# Patient Record
Sex: Female | Born: 1935
Health system: Southern US, Community
[De-identification: ages and names within clinical notes are randomized; demographics above are authoritative.]

## PROBLEM LIST (undated history)

## (undated) DIAGNOSIS — Z7901 Long term (current) use of anticoagulants: Secondary | ICD-10-CM

## (undated) DIAGNOSIS — I619 Nontraumatic intracerebral hemorrhage, unspecified: Secondary | ICD-10-CM

## (undated) DIAGNOSIS — R000 Tachycardia, unspecified: Secondary | ICD-10-CM

## (undated) DIAGNOSIS — I472 Ventricular tachycardia: Secondary | ICD-10-CM

## (undated) DIAGNOSIS — I82409 Acute embolism and thrombosis of unspecified deep veins of unspecified lower extremity: Secondary | ICD-10-CM

## (undated) DIAGNOSIS — I509 Heart failure, unspecified: Secondary | ICD-10-CM

## (undated) DIAGNOSIS — I2699 Other pulmonary embolism without acute cor pulmonale: Secondary | ICD-10-CM

## (undated) DIAGNOSIS — N179 Acute kidney failure, unspecified: Secondary | ICD-10-CM

## (undated) DIAGNOSIS — I519 Heart disease, unspecified: Secondary | ICD-10-CM

## (undated) DIAGNOSIS — I4891 Unspecified atrial fibrillation: Secondary | ICD-10-CM

## (undated) DIAGNOSIS — K922 Gastrointestinal hemorrhage, unspecified: Secondary | ICD-10-CM

## (undated) DIAGNOSIS — R042 Hemoptysis: Secondary | ICD-10-CM

## (undated) DIAGNOSIS — R7989 Other specified abnormal findings of blood chemistry: Secondary | ICD-10-CM

## (undated) DIAGNOSIS — R778 Other specified abnormalities of plasma proteins: Secondary | ICD-10-CM

## (undated) DIAGNOSIS — R739 Hyperglycemia, unspecified: Secondary | ICD-10-CM

## (undated) DIAGNOSIS — R0602 Shortness of breath: Secondary | ICD-10-CM

## (undated) DIAGNOSIS — D696 Thrombocytopenia, unspecified: Secondary | ICD-10-CM

## (undated) DIAGNOSIS — R740 Nonspecific elevation of levels of transaminase and lactic acid dehydrogenase [LDH]: Secondary | ICD-10-CM

## (undated) DIAGNOSIS — I2602 Saddle embolus of pulmonary artery with acute cor pulmonale: Secondary | ICD-10-CM

## (undated) HISTORY — DX: Hemoptysis: R04.2

## (undated) HISTORY — DX: Other pulmonary embolism without acute cor pulmonale: I26.99

## (undated) HISTORY — DX: Acute kidney failure, unspecified: N17.9

## (undated) HISTORY — DX: Tachycardia, unspecified: R00.0

## (undated) HISTORY — DX: Nonspecific elevation of levels of transaminase and lactic acid dehydrogenase (ldh): R74.0

## (undated) HISTORY — DX: Unspecified atrial fibrillation: I48.91

## (undated) HISTORY — DX: Heart disease, unspecified: I51.9

## (undated) HISTORY — DX: Shortness of breath: R06.02

## (undated) HISTORY — PX: KNEE SURGERY: SHX244

## (undated) HISTORY — DX: Hyperglycemia, unspecified: R73.9

## (undated) HISTORY — DX: Thrombocytopenia, unspecified: D69.6

## (undated) HISTORY — DX: Long term (current) use of anticoagulants: Z79.01

## (undated) HISTORY — PX: APPENDECTOMY: SHX54

## (undated) HISTORY — DX: Acute embolism and thrombosis of unspecified deep veins of unspecified lower extremity: I82.409

## (undated) HISTORY — DX: Other specified abnormal findings of blood chemistry: R79.89

## (undated) HISTORY — DX: Gastrointestinal hemorrhage, unspecified: K92.2

## (undated) HISTORY — PX: CHOLECYSTECTOMY: SHX55

## (undated) HISTORY — DX: Nontraumatic intracerebral hemorrhage, unspecified: I61.9

## (undated) HISTORY — PX: OTHER SURGICAL HISTORY: SHX169

## (undated) HISTORY — DX: Ventricular tachycardia: I47.2

## (undated) HISTORY — DX: Other specified abnormalities of plasma proteins: R77.8

## (undated) HISTORY — DX: Morbid (severe) obesity due to excess calories: E66.01

## (undated) HISTORY — DX: Saddle embolus of pulmonary artery with acute cor pulmonale: I26.02

---

## 2007-11-12 DIAGNOSIS — I619 Nontraumatic intracerebral hemorrhage, unspecified: Secondary | ICD-10-CM

## 2007-11-12 HISTORY — DX: Nontraumatic intracerebral hemorrhage, unspecified: I61.9

## 2015-01-05 DIAGNOSIS — H4011X1 Primary open-angle glaucoma, mild stage: Secondary | ICD-10-CM | POA: Diagnosis not present

## 2015-03-16 DIAGNOSIS — E1165 Type 2 diabetes mellitus with hyperglycemia: Secondary | ICD-10-CM | POA: Diagnosis not present

## 2015-03-16 DIAGNOSIS — E559 Vitamin D deficiency, unspecified: Secondary | ICD-10-CM | POA: Diagnosis not present

## 2015-03-16 DIAGNOSIS — E785 Hyperlipidemia, unspecified: Secondary | ICD-10-CM | POA: Diagnosis not present

## 2015-03-16 DIAGNOSIS — K922 Gastrointestinal hemorrhage, unspecified: Secondary | ICD-10-CM | POA: Diagnosis not present

## 2015-04-04 DIAGNOSIS — H4011X1 Primary open-angle glaucoma, mild stage: Secondary | ICD-10-CM | POA: Diagnosis not present

## 2015-06-02 DIAGNOSIS — L03116 Cellulitis of left lower limb: Secondary | ICD-10-CM | POA: Diagnosis not present

## 2015-06-02 DIAGNOSIS — Z6841 Body Mass Index (BMI) 40.0 and over, adult: Secondary | ICD-10-CM | POA: Diagnosis not present

## 2015-06-02 DIAGNOSIS — T63421A Toxic effect of venom of ants, accidental (unintentional), initial encounter: Secondary | ICD-10-CM | POA: Diagnosis not present

## 2015-06-21 DIAGNOSIS — E559 Vitamin D deficiency, unspecified: Secondary | ICD-10-CM | POA: Diagnosis not present

## 2015-06-21 DIAGNOSIS — Z6841 Body Mass Index (BMI) 40.0 and over, adult: Secondary | ICD-10-CM | POA: Diagnosis not present

## 2015-06-21 DIAGNOSIS — K922 Gastrointestinal hemorrhage, unspecified: Secondary | ICD-10-CM | POA: Diagnosis not present

## 2015-06-21 DIAGNOSIS — I10 Essential (primary) hypertension: Secondary | ICD-10-CM | POA: Diagnosis not present

## 2015-06-21 DIAGNOSIS — E785 Hyperlipidemia, unspecified: Secondary | ICD-10-CM | POA: Diagnosis not present

## 2015-06-21 DIAGNOSIS — Z139 Encounter for screening, unspecified: Secondary | ICD-10-CM | POA: Diagnosis not present

## 2015-06-21 DIAGNOSIS — E1165 Type 2 diabetes mellitus with hyperglycemia: Secondary | ICD-10-CM | POA: Diagnosis not present

## 2015-07-11 DIAGNOSIS — H4011X1 Primary open-angle glaucoma, mild stage: Secondary | ICD-10-CM | POA: Diagnosis not present

## 2015-07-20 DIAGNOSIS — H26492 Other secondary cataract, left eye: Secondary | ICD-10-CM | POA: Diagnosis not present

## 2015-09-27 DIAGNOSIS — E559 Vitamin D deficiency, unspecified: Secondary | ICD-10-CM | POA: Diagnosis not present

## 2015-09-27 DIAGNOSIS — K922 Gastrointestinal hemorrhage, unspecified: Secondary | ICD-10-CM | POA: Diagnosis not present

## 2015-09-27 DIAGNOSIS — I10 Essential (primary) hypertension: Secondary | ICD-10-CM | POA: Diagnosis not present

## 2015-09-27 DIAGNOSIS — Z1231 Encounter for screening mammogram for malignant neoplasm of breast: Secondary | ICD-10-CM | POA: Diagnosis not present

## 2015-09-27 DIAGNOSIS — E785 Hyperlipidemia, unspecified: Secondary | ICD-10-CM | POA: Diagnosis not present

## 2015-09-27 DIAGNOSIS — E1165 Type 2 diabetes mellitus with hyperglycemia: Secondary | ICD-10-CM | POA: Diagnosis not present

## 2015-09-27 DIAGNOSIS — Z1389 Encounter for screening for other disorder: Secondary | ICD-10-CM | POA: Diagnosis not present

## 2015-09-27 DIAGNOSIS — M170 Bilateral primary osteoarthritis of knee: Secondary | ICD-10-CM | POA: Diagnosis not present

## 2015-09-27 DIAGNOSIS — Z23 Encounter for immunization: Secondary | ICD-10-CM | POA: Diagnosis not present

## 2015-10-12 DIAGNOSIS — H401131 Primary open-angle glaucoma, bilateral, mild stage: Secondary | ICD-10-CM | POA: Diagnosis not present

## 2015-10-25 DIAGNOSIS — M170 Bilateral primary osteoarthritis of knee: Secondary | ICD-10-CM | POA: Diagnosis not present

## 2015-10-31 DIAGNOSIS — Z1231 Encounter for screening mammogram for malignant neoplasm of breast: Secondary | ICD-10-CM | POA: Diagnosis not present

## 2016-01-01 DIAGNOSIS — E785 Hyperlipidemia, unspecified: Secondary | ICD-10-CM | POA: Diagnosis not present

## 2016-01-01 DIAGNOSIS — K922 Gastrointestinal hemorrhage, unspecified: Secondary | ICD-10-CM | POA: Diagnosis not present

## 2016-01-01 DIAGNOSIS — I10 Essential (primary) hypertension: Secondary | ICD-10-CM | POA: Diagnosis not present

## 2016-01-01 DIAGNOSIS — E1165 Type 2 diabetes mellitus with hyperglycemia: Secondary | ICD-10-CM | POA: Diagnosis not present

## 2016-01-01 DIAGNOSIS — M1990 Unspecified osteoarthritis, unspecified site: Secondary | ICD-10-CM | POA: Diagnosis not present

## 2016-01-01 DIAGNOSIS — Z6841 Body Mass Index (BMI) 40.0 and over, adult: Secondary | ICD-10-CM | POA: Diagnosis not present

## 2016-01-01 DIAGNOSIS — E559 Vitamin D deficiency, unspecified: Secondary | ICD-10-CM | POA: Diagnosis not present

## 2016-04-17 DIAGNOSIS — H401131 Primary open-angle glaucoma, bilateral, mild stage: Secondary | ICD-10-CM | POA: Diagnosis not present

## 2016-07-03 DIAGNOSIS — E1165 Type 2 diabetes mellitus with hyperglycemia: Secondary | ICD-10-CM | POA: Diagnosis not present

## 2016-07-03 DIAGNOSIS — I10 Essential (primary) hypertension: Secondary | ICD-10-CM | POA: Diagnosis not present

## 2016-07-03 DIAGNOSIS — Z9181 History of falling: Secondary | ICD-10-CM | POA: Diagnosis not present

## 2016-07-03 DIAGNOSIS — Z6841 Body Mass Index (BMI) 40.0 and over, adult: Secondary | ICD-10-CM | POA: Diagnosis not present

## 2016-07-03 DIAGNOSIS — E559 Vitamin D deficiency, unspecified: Secondary | ICD-10-CM | POA: Diagnosis not present

## 2016-07-03 DIAGNOSIS — E785 Hyperlipidemia, unspecified: Secondary | ICD-10-CM | POA: Diagnosis not present

## 2016-07-03 DIAGNOSIS — K922 Gastrointestinal hemorrhage, unspecified: Secondary | ICD-10-CM | POA: Diagnosis not present

## 2016-07-17 DIAGNOSIS — H401131 Primary open-angle glaucoma, bilateral, mild stage: Secondary | ICD-10-CM | POA: Diagnosis not present

## 2016-10-21 DIAGNOSIS — R531 Weakness: Secondary | ICD-10-CM | POA: Diagnosis not present

## 2016-10-21 DIAGNOSIS — R609 Edema, unspecified: Secondary | ICD-10-CM | POA: Diagnosis not present

## 2016-10-21 DIAGNOSIS — M7989 Other specified soft tissue disorders: Secondary | ICD-10-CM | POA: Diagnosis not present

## 2016-10-21 DIAGNOSIS — R2240 Localized swelling, mass and lump, unspecified lower limb: Secondary | ICD-10-CM | POA: Diagnosis not present

## 2016-10-24 DIAGNOSIS — R600 Localized edema: Secondary | ICD-10-CM | POA: Diagnosis not present

## 2016-10-24 DIAGNOSIS — Z79899 Other long term (current) drug therapy: Secondary | ICD-10-CM | POA: Diagnosis not present

## 2016-10-24 DIAGNOSIS — Z23 Encounter for immunization: Secondary | ICD-10-CM | POA: Diagnosis not present

## 2016-10-24 DIAGNOSIS — Z6841 Body Mass Index (BMI) 40.0 and over, adult: Secondary | ICD-10-CM | POA: Diagnosis not present

## 2016-10-24 DIAGNOSIS — Z1231 Encounter for screening mammogram for malignant neoplasm of breast: Secondary | ICD-10-CM | POA: Diagnosis not present

## 2016-10-31 DIAGNOSIS — E876 Hypokalemia: Secondary | ICD-10-CM | POA: Diagnosis not present

## 2016-12-12 DIAGNOSIS — R52 Pain, unspecified: Secondary | ICD-10-CM | POA: Diagnosis not present

## 2016-12-12 DIAGNOSIS — R262 Difficulty in walking, not elsewhere classified: Secondary | ICD-10-CM | POA: Diagnosis not present

## 2016-12-12 DIAGNOSIS — E7800 Pure hypercholesterolemia, unspecified: Secondary | ICD-10-CM | POA: Diagnosis not present

## 2016-12-12 DIAGNOSIS — I509 Heart failure, unspecified: Secondary | ICD-10-CM | POA: Diagnosis not present

## 2016-12-12 DIAGNOSIS — I110 Hypertensive heart disease with heart failure: Secondary | ICD-10-CM | POA: Diagnosis not present

## 2016-12-12 DIAGNOSIS — M79651 Pain in right thigh: Secondary | ICD-10-CM | POA: Diagnosis not present

## 2016-12-12 DIAGNOSIS — M25561 Pain in right knee: Secondary | ICD-10-CM | POA: Diagnosis not present

## 2016-12-12 DIAGNOSIS — M79604 Pain in right leg: Secondary | ICD-10-CM | POA: Diagnosis not present

## 2016-12-12 DIAGNOSIS — Z8249 Family history of ischemic heart disease and other diseases of the circulatory system: Secondary | ICD-10-CM | POA: Diagnosis not present

## 2016-12-12 DIAGNOSIS — M7989 Other specified soft tissue disorders: Secondary | ICD-10-CM | POA: Diagnosis not present

## 2016-12-12 DIAGNOSIS — Z79899 Other long term (current) drug therapy: Secondary | ICD-10-CM | POA: Diagnosis not present

## 2016-12-12 DIAGNOSIS — K219 Gastro-esophageal reflux disease without esophagitis: Secondary | ICD-10-CM | POA: Diagnosis not present

## 2016-12-13 DIAGNOSIS — R54 Age-related physical debility: Secondary | ICD-10-CM | POA: Diagnosis not present

## 2016-12-13 DIAGNOSIS — E785 Hyperlipidemia, unspecified: Secondary | ICD-10-CM | POA: Diagnosis not present

## 2016-12-13 DIAGNOSIS — E662 Morbid (severe) obesity with alveolar hypoventilation: Secondary | ICD-10-CM | POA: Diagnosis not present

## 2016-12-13 DIAGNOSIS — M79651 Pain in right thigh: Secondary | ICD-10-CM | POA: Diagnosis not present

## 2016-12-13 DIAGNOSIS — Z79899 Other long term (current) drug therapy: Secondary | ICD-10-CM | POA: Diagnosis not present

## 2016-12-13 DIAGNOSIS — R279 Unspecified lack of coordination: Secondary | ICD-10-CM | POA: Diagnosis not present

## 2016-12-13 DIAGNOSIS — R262 Difficulty in walking, not elsewhere classified: Secondary | ICD-10-CM | POA: Diagnosis not present

## 2016-12-13 DIAGNOSIS — M1711 Unilateral primary osteoarthritis, right knee: Secondary | ICD-10-CM | POA: Diagnosis not present

## 2016-12-13 DIAGNOSIS — E7800 Pure hypercholesterolemia, unspecified: Secondary | ICD-10-CM | POA: Diagnosis not present

## 2016-12-13 DIAGNOSIS — I110 Hypertensive heart disease with heart failure: Secondary | ICD-10-CM | POA: Diagnosis not present

## 2016-12-13 DIAGNOSIS — J101 Influenza due to other identified influenza virus with other respiratory manifestations: Secondary | ICD-10-CM | POA: Diagnosis not present

## 2016-12-13 DIAGNOSIS — M7050 Other bursitis of knee, unspecified knee: Secondary | ICD-10-CM | POA: Diagnosis not present

## 2016-12-13 DIAGNOSIS — K219 Gastro-esophageal reflux disease without esophagitis: Secondary | ICD-10-CM | POA: Diagnosis not present

## 2016-12-13 DIAGNOSIS — I119 Hypertensive heart disease without heart failure: Secondary | ICD-10-CM | POA: Diagnosis not present

## 2016-12-13 DIAGNOSIS — M25561 Pain in right knee: Secondary | ICD-10-CM | POA: Diagnosis not present

## 2016-12-13 DIAGNOSIS — I509 Heart failure, unspecified: Secondary | ICD-10-CM | POA: Diagnosis not present

## 2016-12-13 DIAGNOSIS — Z8249 Family history of ischemic heart disease and other diseases of the circulatory system: Secondary | ICD-10-CM | POA: Diagnosis not present

## 2016-12-13 DIAGNOSIS — R609 Edema, unspecified: Secondary | ICD-10-CM | POA: Diagnosis not present

## 2016-12-13 DIAGNOSIS — Z7401 Bed confinement status: Secondary | ICD-10-CM | POA: Diagnosis not present

## 2016-12-14 DIAGNOSIS — J101 Influenza due to other identified influenza virus with other respiratory manifestations: Secondary | ICD-10-CM | POA: Diagnosis not present

## 2016-12-16 DIAGNOSIS — I119 Hypertensive heart disease without heart failure: Secondary | ICD-10-CM | POA: Diagnosis not present

## 2016-12-16 DIAGNOSIS — E785 Hyperlipidemia, unspecified: Secondary | ICD-10-CM | POA: Diagnosis not present

## 2016-12-16 DIAGNOSIS — R262 Difficulty in walking, not elsewhere classified: Secondary | ICD-10-CM | POA: Diagnosis not present

## 2016-12-16 DIAGNOSIS — M7050 Other bursitis of knee, unspecified knee: Secondary | ICD-10-CM | POA: Diagnosis not present

## 2016-12-19 DIAGNOSIS — M1711 Unilateral primary osteoarthritis, right knee: Secondary | ICD-10-CM | POA: Diagnosis not present

## 2016-12-28 DIAGNOSIS — I110 Hypertensive heart disease with heart failure: Secondary | ICD-10-CM | POA: Diagnosis not present

## 2016-12-28 DIAGNOSIS — I509 Heart failure, unspecified: Secondary | ICD-10-CM | POA: Diagnosis not present

## 2016-12-28 DIAGNOSIS — E7800 Pure hypercholesterolemia, unspecified: Secondary | ICD-10-CM | POA: Diagnosis not present

## 2016-12-28 DIAGNOSIS — M25461 Effusion, right knee: Secondary | ICD-10-CM | POA: Diagnosis not present

## 2016-12-28 DIAGNOSIS — M1711 Unilateral primary osteoarthritis, right knee: Secondary | ICD-10-CM | POA: Diagnosis not present

## 2016-12-31 DIAGNOSIS — M25461 Effusion, right knee: Secondary | ICD-10-CM | POA: Diagnosis not present

## 2016-12-31 DIAGNOSIS — M1711 Unilateral primary osteoarthritis, right knee: Secondary | ICD-10-CM | POA: Diagnosis not present

## 2016-12-31 DIAGNOSIS — E7800 Pure hypercholesterolemia, unspecified: Secondary | ICD-10-CM | POA: Diagnosis not present

## 2016-12-31 DIAGNOSIS — I110 Hypertensive heart disease with heart failure: Secondary | ICD-10-CM | POA: Diagnosis not present

## 2016-12-31 DIAGNOSIS — I509 Heart failure, unspecified: Secondary | ICD-10-CM | POA: Diagnosis not present

## 2017-01-01 DIAGNOSIS — M1711 Unilateral primary osteoarthritis, right knee: Secondary | ICD-10-CM | POA: Diagnosis not present

## 2017-01-01 DIAGNOSIS — I10 Essential (primary) hypertension: Secondary | ICD-10-CM | POA: Diagnosis not present

## 2017-01-01 DIAGNOSIS — Z1231 Encounter for screening mammogram for malignant neoplasm of breast: Secondary | ICD-10-CM | POA: Diagnosis not present

## 2017-01-01 DIAGNOSIS — Z1389 Encounter for screening for other disorder: Secondary | ICD-10-CM | POA: Diagnosis not present

## 2017-01-01 DIAGNOSIS — Z6841 Body Mass Index (BMI) 40.0 and over, adult: Secondary | ICD-10-CM | POA: Diagnosis not present

## 2017-01-01 DIAGNOSIS — E785 Hyperlipidemia, unspecified: Secondary | ICD-10-CM | POA: Diagnosis not present

## 2017-01-01 DIAGNOSIS — E1165 Type 2 diabetes mellitus with hyperglycemia: Secondary | ICD-10-CM | POA: Diagnosis not present

## 2017-01-02 DIAGNOSIS — M1711 Unilateral primary osteoarthritis, right knee: Secondary | ICD-10-CM | POA: Diagnosis not present

## 2017-01-02 DIAGNOSIS — M25461 Effusion, right knee: Secondary | ICD-10-CM | POA: Diagnosis not present

## 2017-01-02 DIAGNOSIS — I110 Hypertensive heart disease with heart failure: Secondary | ICD-10-CM | POA: Diagnosis not present

## 2017-01-02 DIAGNOSIS — E7800 Pure hypercholesterolemia, unspecified: Secondary | ICD-10-CM | POA: Diagnosis not present

## 2017-01-02 DIAGNOSIS — I509 Heart failure, unspecified: Secondary | ICD-10-CM | POA: Diagnosis not present

## 2017-01-07 DIAGNOSIS — M25461 Effusion, right knee: Secondary | ICD-10-CM | POA: Diagnosis not present

## 2017-01-07 DIAGNOSIS — M1711 Unilateral primary osteoarthritis, right knee: Secondary | ICD-10-CM | POA: Diagnosis not present

## 2017-01-07 DIAGNOSIS — E7800 Pure hypercholesterolemia, unspecified: Secondary | ICD-10-CM | POA: Diagnosis not present

## 2017-01-07 DIAGNOSIS — I110 Hypertensive heart disease with heart failure: Secondary | ICD-10-CM | POA: Diagnosis not present

## 2017-01-07 DIAGNOSIS — I509 Heart failure, unspecified: Secondary | ICD-10-CM | POA: Diagnosis not present

## 2017-01-09 DIAGNOSIS — I509 Heart failure, unspecified: Secondary | ICD-10-CM | POA: Diagnosis not present

## 2017-01-09 DIAGNOSIS — I110 Hypertensive heart disease with heart failure: Secondary | ICD-10-CM | POA: Diagnosis not present

## 2017-01-09 DIAGNOSIS — E876 Hypokalemia: Secondary | ICD-10-CM | POA: Diagnosis not present

## 2017-01-09 DIAGNOSIS — M1711 Unilateral primary osteoarthritis, right knee: Secondary | ICD-10-CM | POA: Diagnosis not present

## 2017-01-09 DIAGNOSIS — E7800 Pure hypercholesterolemia, unspecified: Secondary | ICD-10-CM | POA: Diagnosis not present

## 2017-01-09 DIAGNOSIS — Z5181 Encounter for therapeutic drug level monitoring: Secondary | ICD-10-CM | POA: Diagnosis not present

## 2017-01-09 DIAGNOSIS — M25461 Effusion, right knee: Secondary | ICD-10-CM | POA: Diagnosis not present

## 2017-01-14 DIAGNOSIS — I509 Heart failure, unspecified: Secondary | ICD-10-CM | POA: Diagnosis not present

## 2017-01-14 DIAGNOSIS — M1711 Unilateral primary osteoarthritis, right knee: Secondary | ICD-10-CM | POA: Diagnosis not present

## 2017-01-14 DIAGNOSIS — I110 Hypertensive heart disease with heart failure: Secondary | ICD-10-CM | POA: Diagnosis not present

## 2017-01-14 DIAGNOSIS — E7800 Pure hypercholesterolemia, unspecified: Secondary | ICD-10-CM | POA: Diagnosis not present

## 2017-01-14 DIAGNOSIS — M25461 Effusion, right knee: Secondary | ICD-10-CM | POA: Diagnosis not present

## 2017-01-15 ENCOUNTER — Encounter (HOSPITAL_COMMUNITY): Payer: Self-pay | Admitting: Internal Medicine

## 2017-01-15 ENCOUNTER — Inpatient Hospital Stay (HOSPITAL_COMMUNITY): Payer: Medicare PPO

## 2017-01-15 ENCOUNTER — Inpatient Hospital Stay (HOSPITAL_COMMUNITY)
Admission: AD | Admit: 2017-01-15 | Discharge: 2017-01-23 | DRG: 175 | Disposition: A | Discharge status: Skilled Nursing Facility | Payer: Medicare PPO | Source: Other Acute Inpatient Hospital | Attending: Internal Medicine | Admitting: Internal Medicine

## 2017-01-15 DIAGNOSIS — I214 Non-ST elevation (NSTEMI) myocardial infarction: Secondary | ICD-10-CM | POA: Diagnosis not present

## 2017-01-15 DIAGNOSIS — I2690 Septic pulmonary embolism without acute cor pulmonale: Secondary | ICD-10-CM | POA: Diagnosis not present

## 2017-01-15 DIAGNOSIS — J9690 Respiratory failure, unspecified, unspecified whether with hypoxia or hypercapnia: Secondary | ICD-10-CM | POA: Diagnosis not present

## 2017-01-15 DIAGNOSIS — R740 Nonspecific elevation of levels of transaminase and lactic acid dehydrogenase [LDH]: Secondary | ICD-10-CM

## 2017-01-15 DIAGNOSIS — R042 Hemoptysis: Secondary | ICD-10-CM | POA: Diagnosis not present

## 2017-01-15 DIAGNOSIS — N179 Acute kidney failure, unspecified: Secondary | ICD-10-CM | POA: Diagnosis not present

## 2017-01-15 DIAGNOSIS — R7401 Elevation of levels of liver transaminase levels: Secondary | ICD-10-CM

## 2017-01-15 DIAGNOSIS — I2692 Saddle embolus of pulmonary artery without acute cor pulmonale: Secondary | ICD-10-CM | POA: Diagnosis not present

## 2017-01-15 DIAGNOSIS — I509 Heart failure, unspecified: Secondary | ICD-10-CM | POA: Diagnosis not present

## 2017-01-15 DIAGNOSIS — D696 Thrombocytopenia, unspecified: Secondary | ICD-10-CM | POA: Diagnosis not present

## 2017-01-15 DIAGNOSIS — R7989 Other specified abnormal findings of blood chemistry: Secondary | ICD-10-CM

## 2017-01-15 DIAGNOSIS — Z6841 Body Mass Index (BMI) 40.0 and over, adult: Secondary | ICD-10-CM | POA: Diagnosis not present

## 2017-01-15 DIAGNOSIS — R000 Tachycardia, unspecified: Secondary | ICD-10-CM

## 2017-01-15 DIAGNOSIS — E785 Hyperlipidemia, unspecified: Secondary | ICD-10-CM | POA: Diagnosis present

## 2017-01-15 DIAGNOSIS — I82449 Acute embolism and thrombosis of unspecified tibial vein: Secondary | ICD-10-CM | POA: Diagnosis present

## 2017-01-15 DIAGNOSIS — I5082 Biventricular heart failure: Secondary | ICD-10-CM | POA: Diagnosis present

## 2017-01-15 DIAGNOSIS — I472 Ventricular tachycardia: Secondary | ICD-10-CM | POA: Diagnosis present

## 2017-01-15 DIAGNOSIS — Z9181 History of falling: Secondary | ICD-10-CM | POA: Diagnosis not present

## 2017-01-15 DIAGNOSIS — R0602 Shortness of breath: Secondary | ICD-10-CM

## 2017-01-15 DIAGNOSIS — I5032 Chronic diastolic (congestive) heart failure: Secondary | ICD-10-CM | POA: Diagnosis not present

## 2017-01-15 DIAGNOSIS — Z09 Encounter for follow-up examination after completed treatment for conditions other than malignant neoplasm: Secondary | ICD-10-CM | POA: Diagnosis not present

## 2017-01-15 DIAGNOSIS — I2609 Other pulmonary embolism with acute cor pulmonale: Secondary | ICD-10-CM | POA: Diagnosis not present

## 2017-01-15 DIAGNOSIS — I4891 Unspecified atrial fibrillation: Secondary | ICD-10-CM | POA: Diagnosis not present

## 2017-01-15 DIAGNOSIS — I110 Hypertensive heart disease with heart failure: Secondary | ICD-10-CM | POA: Diagnosis not present

## 2017-01-15 DIAGNOSIS — R42 Dizziness and giddiness: Secondary | ICD-10-CM | POA: Diagnosis not present

## 2017-01-15 DIAGNOSIS — I4901 Ventricular fibrillation: Secondary | ICD-10-CM | POA: Diagnosis not present

## 2017-01-15 DIAGNOSIS — E872 Acidosis: Secondary | ICD-10-CM | POA: Diagnosis not present

## 2017-01-15 DIAGNOSIS — Z8249 Family history of ischemic heart disease and other diseases of the circulatory system: Secondary | ICD-10-CM | POA: Diagnosis not present

## 2017-01-15 DIAGNOSIS — I5030 Unspecified diastolic (congestive) heart failure: Secondary | ICD-10-CM | POA: Diagnosis not present

## 2017-01-15 DIAGNOSIS — I2699 Other pulmonary embolism without acute cor pulmonale: Secondary | ICD-10-CM | POA: Diagnosis not present

## 2017-01-15 DIAGNOSIS — I480 Paroxysmal atrial fibrillation: Secondary | ICD-10-CM

## 2017-01-15 DIAGNOSIS — I2601 Septic pulmonary embolism with acute cor pulmonale: Secondary | ICD-10-CM | POA: Diagnosis not present

## 2017-01-15 DIAGNOSIS — Z452 Encounter for adjustment and management of vascular access device: Secondary | ICD-10-CM

## 2017-01-15 DIAGNOSIS — A419 Sepsis, unspecified organism: Secondary | ICD-10-CM | POA: Diagnosis not present

## 2017-01-15 DIAGNOSIS — R748 Abnormal levels of other serum enzymes: Secondary | ICD-10-CM | POA: Diagnosis not present

## 2017-01-15 DIAGNOSIS — R739 Hyperglycemia, unspecified: Secondary | ICD-10-CM | POA: Diagnosis not present

## 2017-01-15 DIAGNOSIS — J9601 Acute respiratory failure with hypoxia: Secondary | ICD-10-CM | POA: Diagnosis present

## 2017-01-15 DIAGNOSIS — I2602 Saddle embolus of pulmonary artery with acute cor pulmonale: Principal | ICD-10-CM

## 2017-01-15 DIAGNOSIS — Z9049 Acquired absence of other specified parts of digestive tract: Secondary | ICD-10-CM | POA: Diagnosis not present

## 2017-01-15 DIAGNOSIS — E860 Dehydration: Secondary | ICD-10-CM | POA: Diagnosis not present

## 2017-01-15 DIAGNOSIS — R404 Transient alteration of awareness: Secondary | ICD-10-CM | POA: Diagnosis not present

## 2017-01-15 DIAGNOSIS — I82401 Acute embolism and thrombosis of unspecified deep veins of right lower extremity: Secondary | ICD-10-CM | POA: Diagnosis present

## 2017-01-15 DIAGNOSIS — R778 Other specified abnormalities of plasma proteins: Secondary | ICD-10-CM

## 2017-01-15 DIAGNOSIS — Z86711 Personal history of pulmonary embolism: Secondary | ICD-10-CM | POA: Diagnosis present

## 2017-01-15 DIAGNOSIS — I959 Hypotension, unspecified: Secondary | ICD-10-CM | POA: Diagnosis not present

## 2017-01-15 DIAGNOSIS — R0902 Hypoxemia: Secondary | ICD-10-CM | POA: Diagnosis not present

## 2017-01-15 DIAGNOSIS — I519 Heart disease, unspecified: Secondary | ICD-10-CM

## 2017-01-15 HISTORY — DX: Other pulmonary embolism without acute cor pulmonale: I26.99

## 2017-01-15 HISTORY — DX: Hyperglycemia, unspecified: R73.9

## 2017-01-15 HISTORY — DX: Heart failure, unspecified: I50.9

## 2017-01-15 MED ORDER — HEPARIN (PORCINE) IN NACL 100-0.45 UNIT/ML-% IJ SOLN
1050.0000 [IU]/h | INTRAMUSCULAR | Status: DC
Start: 2017-01-16 — End: 2017-01-21
  Administered 2017-01-15: 1000 [IU]/h via INTRAVENOUS
  Administered 2017-01-17 – 2017-01-18 (×2): 850 [IU]/h via INTRAVENOUS
  Administered 2017-01-19: 950 [IU]/h via INTRAVENOUS
  Administered 2017-01-20: 1050 [IU]/h via INTRAVENOUS
  Filled 2017-01-15 (×8): qty 250

## 2017-01-15 MED ORDER — SODIUM CHLORIDE 0.9 % IV BOLUS (SEPSIS)
2000.0000 mL | Freq: Once | INTRAVENOUS | Status: AC
  Administered 2017-01-15: 2000 mL via INTRAVENOUS

## 2017-01-15 MED ORDER — ACETAMINOPHEN 325 MG PO TABS
650.0000 mg | ORAL_TABLET | Freq: Four times a day (QID) | ORAL | Status: DC | PRN
  Administered 2017-01-16: 650 mg via ORAL
  Filled 2017-01-15: qty 2

## 2017-01-15 MED ORDER — ACETAMINOPHEN 650 MG RE SUPP: 650.0000 mg | Freq: Four times a day (QID) | RECTAL | Status: DC | PRN

## 2017-01-15 MED ORDER — ONDANSETRON HCL 4 MG PO TABS: 4.0000 mg | ORAL_TABLET | Freq: Four times a day (QID) | ORAL | Status: DC | PRN

## 2017-01-15 MED ORDER — ONDANSETRON HCL 4 MG/2ML IJ SOLN
4.0000 mg | Freq: Four times a day (QID) | INTRAMUSCULAR | Status: DC | PRN
  Administered 2017-01-16 – 2017-01-18 (×3): 4 mg via INTRAVENOUS
  Filled 2017-01-15 (×3): qty 2

## 2017-01-15 MED ORDER — INSULIN ASPART 100 UNIT/ML ~~LOC~~ SOLN
0.0000 [IU] | SUBCUTANEOUS | Status: DC
  Administered 2017-01-16: 1 [IU] via SUBCUTANEOUS

## 2017-01-15 MED ORDER — SODIUM CHLORIDE 0.9 % IV SOLN
INTRAVENOUS | Status: AC
  Administered 2017-01-16 (×2): via INTRAVENOUS

## 2017-01-15 NOTE — Progress Notes (Signed)
Dr. Hal Hope at bedside for pt evaluation.  NS bolus initiated as directed.  Pt continues to deny any pain or discomfort at this time.

## 2017-01-15 NOTE — Progress Notes (Addendum)
ANTICOAGULATION CONSULT NOTE - Initial Consult  Pharmacy Consult for Heparin Indication: pulmonary embolus  Allergies  Allergen Reactions  . Aspirin Other (See Comments)    GI bleed  . Nsaids Other (See Comments)    GI bleed  . Penicillins Shortness Of Breath and Rash  . Simvastatin Nausea Only  . Vancomycin Rash    Patient Measurements: Height: 5\' 5"  (165.1 cm) Weight: 218 lb 4.8 oz (99 kg) IBW/kg (Calculated) : 57 Heparin Dosing Weight: 79 kg  Vital Signs: Temp: 98 F (36.7 C) (03/07 2245) Temp Source: Oral (03/07 2245) BP: 88/50 (03/07 2245) Pulse Rate: 89 (03/07 2245)  Labs: No results for input(s): HGB, HCT, PLT, APTT, LABPROT, INR, HEPARINUNFRC, HEPRLOWMOCWT, CREATININE, CKTOTAL, CKMB, TROPONINI in the last 72 hours.  CrCl cannot be calculated (No order found.).   Medical History: Past Medical History:  Diagnosis Date  . CHF (congestive heart failure) (HCC)     Medications:  Awaiting electronic med rec  Assessment: 82 y.o. F presents from [place] where she was found to have extensive b/l PE with R heart strain on CT. Heparin bolus 4000 units and gtt 1000 units/hr started ~1600 at [place].  Labs from [place]: Hgb 14.5, Hct 44.2, plt 114, INR 1.1, SCr 1.4  Goal of Therapy:  Heparin level 0.3-0.7 units/ml Monitor platelets by anticoagulation protocol: Yes   Plan:  STAT heparin level Continue heparin at 1000 units/hr Will f/u daily heparin level and CBC  Sherlon Handing, PharmD, BCPS Clinical pharmacist, pager [phone] 01/15/2017,11:59 PM   Addendum (3/8 0120): Heparin level 0.87 (supratherapeutic) on 1000 units/hr. No bleeding noted.  Plan: Decrease heparin to 850 units/hr Will f/u 8hr heparin level  Sherlon Handing, PharmD, BCPS Clinical pharmacist, pager [phone] 01/16/2017 1:17 AM

## 2017-01-15 NOTE — H&P (Signed)
History and Physical    Jennifer Medina POE:[id number] DOB: [birth year] DOA: 01/15/2017  PCP: No primary care provider on file.  Patient coming from: Patient was transferred from [facility].  Chief Complaint: Loss of consciousness.  HPI: Jennifer Medina is a 82 y.o. female with history of CHF yesterday while walking in her house had a brief episode of syncope. Patient states last month patient had torn her right knee ligament and has been in the rehabilitation for 2 weeks. Last 1 week patient was at home. Since yesterday morning patient has not been feeling well and was having mild chest pressure and shortness of breath. Patient had a small episode of syncope. Patient's neighbor came and checked on her and called the EMS. At [facility] EMS was called and patient was brought to the ER and CT scan of the head done showed submassive pulmonary embolism with RV strain. Patient was tender on heparin. Troponin is mildly elevated with lactate 4.5. On my exam after patient reached [facility] patient is not in distress but hypotensive with blood pressure in the 80 systolic. Patient presently doesn't have any chest pain or shortness of breath. But feels weak.  ED Course: Patient was a direct admit.  Review of Systems: As per HPI, rest all negative.   Past Medical History:  Diagnosis Date  . CHF (congestive heart failure) ([place])     Past Surgical History:  Procedure Laterality Date  . APPENDECTOMY    . CHOLECYSTECTOMY    . KNEE SURGERY    . rotator cuff surgery       reports that she has never smoked. She has never used smokeless tobacco. She reports that she does not drink alcohol or use drugs.  Allergies  Allergen Reactions  . Aspirin Other (See Comments)    GI bleed  . Nsaids Other (See Comments)    GI bleed  . Penicillins Shortness Of Breath and Rash  . Simvastatin Nausea Only  . Vancomycin Rash    Family History  Problem Relation Age of Onset  . CAD  Father     Prior to Admission medications   Not on File    Physical Exam: Vitals:   01/15/17 2245  BP: (!) 88/50  Pulse: 89  Resp: 19  Temp: 98 F (36.7 C)  TempSrc: Oral  SpO2: 95%  Weight: 99 kg (218 lb 4.8 oz)  Height: 5\' 5"  (1.651 m)      Constitutional: Moderately built and nourished. Vitals:   01/15/17 2245  BP: (!) 88/50  Pulse: 89  Resp: 19  Temp: 98 F (36.7 C)  TempSrc: Oral  SpO2: 95%  Weight: 99 kg (218 lb 4.8 oz)  Height: 5\' 5"  (1.651 m)   Eyes: Anicteric. no pallor. ENMT: No discharge from the ears eyes nose and mouth. Neck: No mass felt. No JVD elevated. Respiratory: No rhonchi or crepitations. Cardiovascular: S1 and S2 heard. Abdomen: Soft nontender bowel sounds present. Musculoskeletal: Bilateral lower extremity edema. Skin: No rash. Skin appears warm. Neurologic: Alert awake oriented to time place and person. Moves all extremities. Psychiatric: Appears normal.   Labs on Admission: I have personally reviewed following labs and imaging studies  CBC: No results for input(s): WBC, NEUTROABS, HGB, HCT, MCV, PLT in the last 168 hours. Basic Metabolic Panel: No results for input(s): NA, K, CL, CO2, GLUCOSE, BUN, CREATININE, CALCIUM, MG, PHOS in the last 168 hours. GFR: CrCl cannot be calculated (No order found.). Liver Function Tests: No results  for input(s): AST, ALT, ALKPHOS, BILITOT, PROT, ALBUMIN in the last 168 hours. No results for input(s): LIPASE, AMYLASE in the last 168 hours. No results for input(s): AMMONIA in the last 168 hours. Coagulation Profile: No results for input(s): INR, PROTIME in the last 168 hours. Cardiac Enzymes: No results for input(s): CKTOTAL, CKMB, CKMBINDEX, TROPONINI in the last 168 hours. BNP (last 3 results) No results for input(s): PROBNP in the last 8760 hours. HbA1C: No results for input(s): HGBA1C in the last 72 hours. CBG: No results for input(s): GLUCAP in the last 168 hours. Lipid Profile: No  results for input(s): CHOL, HDL, LDLCALC, TRIG, CHOLHDL, LDLDIRECT in the last 72 hours. Thyroid Function Tests: No results for input(s): TSH, T4TOTAL, FREET4, T3FREE, THYROIDAB in the last 72 hours. Anemia Panel: No results for input(s): VITAMINB12, FOLATE, FERRITIN, TIBC, IRON, RETICCTPCT in the last 72 hours. Urine analysis: No results found for: COLORURINE, APPEARANCEUR, LABSPEC, PHURINE, GLUCOSEU, HGBUR, BILIRUBINUR, KETONESUR, PROTEINUR, UROBILINOGEN, NITRITE, LEUKOCYTESUR Sepsis Labs: @LABRCNTIP (procalcitonin:4,lacticidven:4) )No results found for this or any previous visit (from the past 240 hour(s)).   Radiological Exams on Admission: No results found.  EKG: Independently reviewed. Normal sinus rhythm with left axis deviation.  Assessment/Plan Principal Problem:   Acute pulmonary embolism (HCC) Active Problems:   CHF (congestive heart failure) (HCC)   Hyperglycemia    1. Acute pulmonary embolism with hypotension probably provoked - I have consulted pulmonary critical care since patient is hypotensive. I have ordered 2 L normal saline bolus. Will recheck BNP and troponin and lactate levels. Check 2-D echo Doppler lower extremity. 2. Hyperglycemia - patient's blood sugar was found to be elevated at [facility]. Check hemoglobin O7H and metabolic panel is pending. 3. Possible UTI - recheck UA. 4. History of CHF - check 2-D echo. Patient is receiving fluids since patient is hypotensive.  All labs are pending.   DVT prophylaxis: Heparin. Code Status: Full code.  Family Communication: Discussed with patient.  Disposition Plan: Home.  Consults called: Pulmonary critical care.  Admission status: Inpatient.    Rise Patience MD [facility] Pager [phone].  If 7PM-7AM, please contact night-coverage [email or web address] Password [facility]  01/15/2017, 11:32 PM

## 2017-01-15 NOTE — Progress Notes (Signed)
Patient arrived from [facility].  BP 88/50, asymptomatic while laying in bed.  No complaints of pain at this time.  [facility] admissions paged via [place] with this information.

## 2017-01-16 ENCOUNTER — Encounter (HOSPITAL_COMMUNITY): Payer: Self-pay | Admitting: *Deleted

## 2017-01-16 ENCOUNTER — Inpatient Hospital Stay (HOSPITAL_COMMUNITY): Payer: Medicare PPO

## 2017-01-16 DIAGNOSIS — I2699 Other pulmonary embolism without acute cor pulmonale: Secondary | ICD-10-CM

## 2017-01-16 DIAGNOSIS — I509 Heart failure, unspecified: Secondary | ICD-10-CM

## 2017-01-16 DIAGNOSIS — R0902 Hypoxemia: Secondary | ICD-10-CM

## 2017-01-16 DIAGNOSIS — I2609 Other pulmonary embolism with acute cor pulmonale: Secondary | ICD-10-CM

## 2017-01-16 DIAGNOSIS — I959 Hypotension, unspecified: Secondary | ICD-10-CM

## 2017-01-16 LAB — CBC WITH DIFFERENTIAL/PLATELET
BASOS PCT: 0 %
Basophils Absolute: 0 10*3/uL (ref 0.0–0.1)
EOS ABS: 0 10*3/uL (ref 0.0–0.7)
Eosinophils Relative: 0 %
HCT: 43.3 % (ref 36.0–46.0)
HEMOGLOBIN: 13.8 g/dL (ref 12.0–15.0)
LYMPHS ABS: 2 10*3/uL (ref 0.7–4.0)
Lymphocytes Relative: 23 %
MCH: 30.3 pg (ref 26.0–34.0)
MCHC: 31.9 g/dL (ref 30.0–36.0)
MCV: 95.2 fL (ref 78.0–100.0)
Monocytes Absolute: 0.7 10*3/uL (ref 0.1–1.0)
Monocytes Relative: 8 %
NEUTROS PCT: 68 %
Neutro Abs: 6 10*3/uL (ref 1.7–7.7)
Platelets: 90 10*3/uL — ABNORMAL LOW (ref 150–400)
RBC: 4.55 MIL/uL (ref 3.87–5.11)
RDW: 15.2 % (ref 11.5–15.5)
WBC: 8.7 10*3/uL (ref 4.0–10.5)

## 2017-01-16 LAB — URINALYSIS, ROUTINE W REFLEX MICROSCOPIC
Bilirubin Urine: NEGATIVE
GLUCOSE, UA: NEGATIVE mg/dL
Ketones, ur: NEGATIVE mg/dL
Leukocytes, UA: NEGATIVE
NITRITE: NEGATIVE
Protein, ur: 100 mg/dL — AB
pH: 5 (ref 5.0–8.0)

## 2017-01-16 LAB — BASIC METABOLIC PANEL
Anion gap: 11 (ref 5–15)
BUN: 28 mg/dL — ABNORMAL HIGH (ref 6–20)
CHLORIDE: 109 mmol/L (ref 101–111)
CO2: 19 mmol/L — ABNORMAL LOW (ref 22–32)
Calcium: 7.8 mg/dL — ABNORMAL LOW (ref 8.9–10.3)
Creatinine, Ser: 1.35 mg/dL — ABNORMAL HIGH (ref 0.44–1.00)
GFR calc Af Amer: 42 mL/min — ABNORMAL LOW (ref >60)
GFR calc non Af Amer: 36 mL/min — ABNORMAL LOW (ref >60)
GLUCOSE: 139 mg/dL — AB (ref 65–99)
POTASSIUM: 4 mmol/L (ref 3.5–5.1)
SODIUM: 139 mmol/L (ref 135–145)

## 2017-01-16 LAB — TROPONIN I
TROPONIN I: 0.3 ng/mL — AB (ref <0.03)
Troponin I: 0.38 ng/mL (ref <0.03)
Troponin I: 0.23 ng/mL (ref <0.03)

## 2017-01-16 LAB — CBC
HCT: 38.8 % (ref 36.0–46.0)
HEMATOCRIT: 40.2 % (ref 36.0–46.0)
Hemoglobin: 12.6 g/dL (ref 12.0–15.0)
Hemoglobin: 12.4 g/dL (ref 12.0–15.0)
MCH: 29.9 pg (ref 26.0–34.0)
MCH: 30 pg (ref 26.0–34.0)
MCHC: 31.3 g/dL (ref 30.0–36.0)
MCHC: 32 g/dL (ref 30.0–36.0)
MCV: 95.3 fL (ref 78.0–100.0)
MCV: 93.9 fL (ref 78.0–100.0)
PLATELETS: 97 10*3/uL — AB (ref 150–400)
Platelets: 99 10*3/uL — ABNORMAL LOW (ref 150–400)
RBC: 4.22 MIL/uL (ref 3.87–5.11)
RBC: 4.13 MIL/uL (ref 3.87–5.11)
RDW: 15.4 % (ref 11.5–15.5)
RDW: 15.4 % (ref 11.5–15.5)
WBC: 7.7 10*3/uL (ref 4.0–10.5)
WBC: 7 10*3/uL (ref 4.0–10.5)

## 2017-01-16 LAB — PROTIME-INR
INR: 1.2
PROTHROMBIN TIME: 15.3 s — AB (ref 11.4–15.2)

## 2017-01-16 LAB — COMPREHENSIVE METABOLIC PANEL
ALK PHOS: 138 U/L — AB (ref 38–126)
ALT: 58 U/L — ABNORMAL HIGH (ref 14–54)
AST: 94 U/L — AB (ref 15–41)
Albumin: 2.7 g/dL — ABNORMAL LOW (ref 3.5–5.0)
Anion gap: 17 — ABNORMAL HIGH (ref 5–15)
BILIRUBIN TOTAL: 1 mg/dL (ref 0.3–1.2)
BUN: 29 mg/dL — ABNORMAL HIGH (ref 6–20)
CALCIUM: 8.3 mg/dL — AB (ref 8.9–10.3)
CO2: 19 mmol/L — AB (ref 22–32)
Chloride: 102 mmol/L (ref 101–111)
Creatinine, Ser: 1.4 mg/dL — ABNORMAL HIGH (ref 0.44–1.00)
GFR calc Af Amer: 40 mL/min — ABNORMAL LOW (ref >60)
GFR calc non Af Amer: 34 mL/min — ABNORMAL LOW (ref >60)
GLUCOSE: 127 mg/dL — AB (ref 65–99)
POTASSIUM: 4.1 mmol/L (ref 3.5–5.1)
SODIUM: 138 mmol/L (ref 135–145)
Total Protein: 5.7 g/dL — ABNORMAL LOW (ref 6.5–8.1)

## 2017-01-16 LAB — ECHOCARDIOGRAM COMPLETE
CHL CUP RV SYS PRESS: 57 mmHg
EERAT: 8.84
FS: 37 % (ref 28–44)
HEIGHTINCHES: 65 in
IV/PV OW: 1.13
LA diam end sys: 28 mm
LA vol A4C: 68.1 ml
LA vol index: 25.8 mL/m2
LA vol: 54.7 mL
LADIAMINDEX: 1.32 cm/m2
LASIZE: 28 mm
LDCA: 2.84 cm2
LV E/e' medial: 8.84
LV E/e'average: 8.84
LV PW d: 8 mm — AB (ref 0.6–1.1)
LV TDI E'LATERAL: 6.2
LV TDI E'MEDIAL: 6.64
LVELAT: 6.2 cm/s
LVOT VTI: 15.8 cm
LVOT peak grad rest: 3 mmHg
LVOTD: 19 mm
LVOTPV: 90.2 cm/s
LVOTSV: 45 mL
Lateral S' vel: 9.14 cm/s
MV pk E vel: 54.8 m/s
MVPKAVEL: 96 m/s
RV TAPSE: 12.2 mm
Reg peak vel: 350 cm/s
TR max vel: 350 cm/s
WEIGHTICAEL: 3770.75 [oz_av]

## 2017-01-16 LAB — HEPARIN LEVEL (UNFRACTIONATED)
HEPARIN UNFRACTIONATED: 0.62 [IU]/mL (ref 0.30–0.70)
HEPARIN UNFRACTIONATED: 0.67 [IU]/mL (ref 0.30–0.70)
HEPARIN UNFRACTIONATED: 0.87 [IU]/mL — AB (ref 0.30–0.70)

## 2017-01-16 LAB — TYPE AND SCREEN
ABO/RH(D): B POS
ANTIBODY SCREEN: NEGATIVE

## 2017-01-16 LAB — GLUCOSE, CAPILLARY
GLUCOSE-CAPILLARY: 127 mg/dL — AB (ref 65–99)
Glucose-Capillary: 142 mg/dL — ABNORMAL HIGH (ref 65–99)
Glucose-Capillary: 115 mg/dL — ABNORMAL HIGH (ref 65–99)
Glucose-Capillary: 112 mg/dL — ABNORMAL HIGH (ref 65–99)
Glucose-Capillary: 133 mg/dL — ABNORMAL HIGH (ref 65–99)

## 2017-01-16 LAB — LACTIC ACID, PLASMA
Lactic Acid, Venous: 2.6 mmol/L (ref 0.5–1.9)
Lactic Acid, Venous: 2 mmol/L (ref 0.5–1.9)

## 2017-01-16 LAB — BRAIN NATRIURETIC PEPTIDE: B Natriuretic Peptide: 841.8 pg/mL — ABNORMAL HIGH (ref 0.0–100.0)

## 2017-01-16 LAB — MRSA PCR SCREENING: MRSA by PCR: NEGATIVE

## 2017-01-16 LAB — ABO/RH: ABO/RH(D): B POS

## 2017-01-16 MED ORDER — ORAL CARE MOUTH RINSE
15.0000 mL | Freq: Two times a day (BID) | OROMUCOSAL | Status: DC
  Administered 2017-01-19 (×2): 15 mL via OROMUCOSAL

## 2017-01-16 MED ORDER — ACETAMINOPHEN 325 MG PO TABS
650.0000 mg | ORAL_TABLET | Freq: Four times a day (QID) | ORAL | Status: DC | PRN
  Administered 2017-01-17 – 2017-01-21 (×3): 650 mg via ORAL
  Filled 2017-01-16 (×3): qty 2

## 2017-01-16 MED ORDER — CHLORHEXIDINE GLUCONATE 0.12 % MT SOLN
15.0000 mL | Freq: Two times a day (BID) | OROMUCOSAL | Status: DC
  Administered 2017-01-16 – 2017-01-19 (×6): 15 mL via OROMUCOSAL
  Filled 2017-01-16 (×4): qty 15

## 2017-01-16 MED ORDER — LATANOPROST 0.005 % OP SOLN
1.0000 [drp] | Freq: Every day | OPHTHALMIC | Status: DC
  Administered 2017-01-16 – 2017-01-22 (×7): 1 [drp] via OPHTHALMIC
  Filled 2017-01-16 (×2): qty 2.5

## 2017-01-16 MED ORDER — LORAZEPAM 0.5 MG PO TABS: 0.5000 mg | ORAL_TABLET | Freq: Three times a day (TID) | ORAL | Status: DC | PRN

## 2017-01-16 MED ORDER — ATORVASTATIN CALCIUM 40 MG PO TABS
40.0000 mg | ORAL_TABLET | Freq: Every day | ORAL | Status: DC
  Administered 2017-01-16 – 2017-01-22 (×6): 40 mg via ORAL
  Filled 2017-01-16 (×7): qty 1

## 2017-01-16 MED ORDER — PANTOPRAZOLE SODIUM 40 MG PO TBEC
40.0000 mg | DELAYED_RELEASE_TABLET | Freq: Every day | ORAL | Status: DC
  Administered 2017-01-16 – 2017-01-23 (×8): 40 mg via ORAL
  Filled 2017-01-16 (×8): qty 1

## 2017-01-16 MED ORDER — TRAMADOL HCL 50 MG PO TABS
50.0000 mg | ORAL_TABLET | Freq: Four times a day (QID) | ORAL | Status: DC | PRN
  Administered 2017-01-17 – 2017-01-21 (×4): 50 mg via ORAL
  Filled 2017-01-16 (×4): qty 1

## 2017-01-16 NOTE — Consult Note (Signed)
           [facility] CM Primary Care Navigator  01/16/2017  [facility] 1935/03/17 [id number]   Went to see patientat the bedside to identify possible discharge needsbut staff reports that patient was transferred to 75M 05(ICU).  Patient was moved to a higher level of care due to symptomatic hypotension.  Will attempt to meet with patient at another time, when transferred out of ICU.   For questions, please contact:  Dannielle Huh, BSN, RN- [facility] Primary Care Navigator  Telephone: [phone] [place]

## 2017-01-16 NOTE — Progress Notes (Signed)
ANTICOAGULATION CONSULT NOTE - Follow Up Consult  Pharmacy Consult for Heparin Indication: pulmonary embolus  Allergies  Allergen Reactions  . Aspirin Other (See Comments)    GI bleed  . Nsaids Other (See Comments)    GI bleed  . Penicillins Shortness Of Breath and Rash  . Simvastatin Nausea Only  . Vancomycin Rash    Patient Measurements: Height: 5\' 5"  (165.1 cm) Weight: 235 lb 10.8 oz (106.9 kg) IBW/kg (Calculated) : 57 Heparin Dosing Weight: 79 Kg  Vital Signs: Temp: 97.4 F (36.3 C) (03/08 1950) Temp Source: Axillary (03/08 1950) BP: 102/66 (03/08 2000) Pulse Rate: 70 (03/08 2000)  Labs:  Recent Labs  01/15/17 2338 01/16/17 0003 01/16/17 0545 01/16/17 1126 01/16/17 1224 01/16/17 2018  HGB 13.8  --  12.6  --   --  12.4  HCT 43.3  --  40.2  --   --  38.8  PLT 90*  --  99*  --   --  97*  LABPROT  --  15.3*  --   --   --   --   INR  --  1.20  --   --   --   --   HEPARINUNFRC  --  0.87*  --  0.67  --  0.62  CREATININE 1.40*  --  1.35*  --   --   --   TROPONINI 0.38*  --  0.30*  --  0.23*  --     Estimated Creatinine Clearance: 40.4 mL/min (by C-G formula based on SCr of 1.35 mg/dL (H)).   Medications:  Infusions:  . sodium chloride 100 mL/hr at 01/16/17 2000  . heparin 850 Units/hr (01/16/17 2000)   Assessment: 82 year old female from [place] with extensive bilateral pulmonary embolism with R heart strain on CT. Heparin was initiated. No bleeding or infusion related issues reported from RN.  Heparin level remains therapeutic: 0.62.  CBC remains stable  No bleeding or issues with line per RN.   Goal of Therapy:  Heparin level 0.3-0.7 units/ml Monitor platelets by anticoagulation protocol: Yes   Plan:  Continue heparin at 850 units/hr Daily heparin level and CBC while on therapy Monitor for signs and symptoms of bleeding  Georga Bora, PharmD Clinical Pharmacist Pager: [phone] 01/16/2017 9:47 PM

## 2017-01-16 NOTE — Progress Notes (Signed)
Dr. Hal Hope notified of continued low BP s/p 1st liter NS bolus.  Verbal from MD to administer ordered 2nd liter bolus.

## 2017-01-16 NOTE — Progress Notes (Signed)
  Echocardiogram 2D Echocardiogram has been performed.  Johny Chess 01/16/2017, 2:57 PM

## 2017-01-16 NOTE — Progress Notes (Addendum)
PCCM Progress Note  Admission date: 01/15/2017 Consult date: 01/16/2017 Referring provider: Dr. Hal Hope, [facility]  CC: syncope  HPI: 82 yo female had Rt knee injury on 12/11/16 associated with swelling and immobility.  Developed syncope, and found to have saddle PE.  She has hx of ICH in 2009 and massive GI bleed in 2010.  Subjective: Feels dizzy if she sit up.  Denies chest pain, dyspnea, nausea.  Vital signs: BP 98/67   Pulse 80   Temp 98.9 F (37.2 C) (Oral)   Resp 20   Ht 5\' 5"  (1.651 m)   Wt 235 lb 10.8 oz (106.9 kg)   SpO2 98%   BMI 39.22 kg/m   Intake/output: I/O last 3 completed shifts: In: 2751.5 [P.O.:150; I.V.:2571.5; Other:30] Out: 145 [Urine:145]  General: pleasant Neuro: alert, normal strength HEENT: no stridor Cardiac: regular, no murmur Chest: no wheeze Abd: soft, non tender Ext: 2+ edema Skin: no rashes   CMP Latest Ref Rng & Units 01/16/2017 01/15/2017  Glucose 65 - 99 mg/dL 139(H) 127(H)  BUN 6 - 20 mg/dL 28(H) 29(H)  Creatinine 0.44 - 1.00 mg/dL 1.35(H) 1.40(H)  Sodium 135 - 145 mmol/L 139 138  Potassium 3.5 - 5.1 mmol/L 4.0 4.1  Chloride 101 - 111 mmol/L 109 102  CO2 22 - 32 mmol/L 19(L) 19(L)  Calcium 8.9 - 10.3 mg/dL 7.8(L) 8.3(L)  Total Protein 6.5 - 8.1 g/dL - 5.7(L)  Total Bilirubin 0.3 - 1.2 mg/dL - 1.0  Alkaline Phos 38 - 126 U/L - 138(H)  AST 15 - 41 U/L - 94(H)  ALT 14 - 54 U/L - 58(H)     CBC Latest Ref Rng & Units 01/16/2017 01/15/2017  WBC 4.0 - 10.5 K/uL 7.7 8.7  Hemoglobin 12.0 - 15.0 g/dL 12.6 13.8  Hematocrit 36.0 - 46.0 % 40.2 43.3  Platelets 150 - 400 K/uL 99(L) 90(L)     ABG No results found for: PHART, PCO2ART, PO2ART, HCO3, TCO2, ACIDBASEDEF, O2SAT   CBG (last 3)   Recent Labs  01/16/17 0115 01/16/17 0336 01/16/17 0753  GLUCAP 127* 142* 115*     Imaging: Dg Chest Port 1 View  Result Date: 01/15/2017 CLINICAL DATA:  Shortness of breath EXAM: PORTABLE CHEST 1 VIEW COMPARISON:  01/15/2017, FINDINGS: No acute  infiltrate or effusion. Stable cardiomediastinal silhouette. No pneumothorax. IMPRESSION: Stable borderline to mild cardiomegaly.  No edema.  No infiltrate. Electronically Signed   By: Donavan Foil M.D.   On: 01/15/2017 23:38     Studies: CT angio chest 01/15/17 >> saddle PE, RV:LV ratio 2.57  Events: 3/07 Transfer from [place]  Summary: 82 yo female with submassive PE.  She has hx of ICH and massive GI bleeding.  Blood pressure is borderline low, but HR is acceptable and on minimal oxygen support.  Symptoms improved also.  Had extensive discussion with her about risks/benefits for thrombolytic therapy.  Assessment/plan:  Acute submassive saddle PE. - continue heparin gtt - defer EKOS >> I think risks out weight benefit at this time - f/u doppler legs, Echo  Thrombocytopenia. - f/u CBC  Hx of GI bleed. - continue protonix  DVT prophylaxis - heparin gtt SUP - Protonix Diet - clear liquids Goals of care - full code  CC time 35 minutes  Chesley Mires, MD [place] 01/16/2017, 8:51 AM Pager:  [phone] After 3pm call: [phone]

## 2017-01-16 NOTE — Progress Notes (Signed)
Rapid response called by [place], RN for continued sypmtomatic hypotension.

## 2017-01-16 NOTE — Significant Event (Signed)
Rapid Response Event Note  Overview: Time Called: 0101 Arrival Time: 0103 Event Type: Cardiac, Hypotension  Initial Focused Assessment: Hypotension   Interventions: Fluid bolus  Plan of Care (if not transferred):  Event Summary: Called to assist with care of patient with low BP. Chart hx. Was reviewed with unit RN. Patient is alert and responsive. Skin is warm and dry. C/o chest tightness and not feeling well. Doppler BP of 70. Pulses are present but soft. Heart sounds with regular rate and rhythm. It was recommended that patient move to a higher level of care due to symptomatic hypotension. No RRT intervention required at this time. We will continue to assist with care of patient as needed.     Jennifer Medina

## 2017-01-16 NOTE — Consult Note (Signed)
Name: Jennifer Medina MRN: [id number] DOB: [birth year]    ADMISSION DATE:  01/15/2017 CONSULTATION DATE:  3/8  REFERRING MD :  Dr. Hal Hope  CHIEF COMPLAINT:  PE  HISTORY OF PRESENT ILLNESS:  82 year old female with PMH significant for HTN, CHF, fall with traumatic "bleeding on brain" in 02/26/2008, GI bleeding "10 years ago", and diverticulitis. Her recent medical course includes "torn ligaments" in R knee for which she was hospitalized 2 days at the end of January and was subsequently sent to rehab facility for 16 days in early February. She did not require surgery and reports she was fairly mobile during her time in rehab. 3/7 she awoke in her usual state of health and went to the kitchen to make breakfast. She felt lightheaded so she rested and called her neighbor (she lives alone after husband died of cancer in late February 26, 2016). When she got up again she suffered a syncopal episode and awoke within 10 minutes when her neighbor arrived at her house. She was transported via EMS to [facility] where she was found to have extensive PE including a large saddle emboli.   SIGNIFICANT EVENTS    STUDIES:  CT angio chest 3/8 > Extensive bilateral PE with large saddle embolus. RV/LV ratio 2.57.     PAST MEDICAL HISTORY :   has a past medical history of CHF (congestive heart failure) ([place]).  has a past surgical history that includes Cholecystectomy; Appendectomy; rotator cuff surgery; and Knee surgery. Prior to Admission medications   Not on File   Allergies  Allergen Reactions  . Aspirin Other (See Comments)    GI bleed  . Nsaids Other (See Comments)    GI bleed  . Penicillins Shortness Of Breath and Rash  . Simvastatin Nausea Only  . Vancomycin Rash    FAMILY HISTORY:  family history includes CAD in her father. SOCIAL HISTORY:  reports that she has never smoked. She has never used smokeless tobacco. She reports that she does not drink alcohol or use drugs.  REVIEW OF SYSTEMS:     Bolds are positive  Constitutional: weight loss, gain, night sweats, Fevers, chills, fatigue .  HEENT: headaches, Sore throat, sneezing, nasal congestion, post nasal drip, Difficulty swallowing, Tooth/dental problems, visual complaints visual changes, ear ache CV:  chest pain, radiates:,Orthopnea, PND, swelling in lower extremities, dizziness, palpitations, syncope.  GI  heartburn, indigestion, abdominal pain, nausea, vomiting, diarrhea, change in bowel habits, loss of appetite, bloody stools.  Resp: cough, productive: , hemoptysis, dyspnea, chest pain, pleuritic.  Skin: rash or itching or icterus. Bruising R knee and foot resolving GU: dysuria, change in color of urine, urgency or frequency. flank pain, hematuria  MS: R leg pain pain or swelling. decreased range of motion  Psych: change in mood or affect. depression or anxiety.  Neuro: difficulty with speech, weakness, numbness, ataxia    SUBJECTIVE:   VITAL SIGNS: Temp:  [98 F (36.7 C)] 98 F (36.7 C) (03/07 2245) Pulse Rate:  [89] 89 (03/07 2245) Resp:  [19] 19 (03/07 2245) BP: (88)/(50) 88/50 (03/07 2245) SpO2:  [95 %] 95 % (03/07 2245) Weight:  [99 kg (218 lb 4.8 oz)] 99 kg (218 lb 4.8 oz) (03/07 2245)  PHYSICAL EXAMINATION: General:  Morbidly obese female in NAD Neuro:  Alert, oriented, non-focal HEENT:  [place]/AT, PERRL Cardiovascular:  RRR, no MRG Lungs:  Clear bilateral breath sounds Abdomen:  Soft, non-tender, non-distended Musculoskeletal:  No acute deformity Skin:  Grossly intact. Ecchymosis to R knee  and R foot from prior injury.   No results for input(s): NA, K, CL, CO2, BUN, CREATININE, GLUCOSE in the last 168 hours. No results for input(s): HGB, HCT, WBC, PLT in the last 168 hours. Dg Chest Port 1 View  Result Date: 01/15/2017 CLINICAL DATA:  Shortness of breath EXAM: PORTABLE CHEST 1 VIEW COMPARISON:  01/15/2017, FINDINGS: No acute infiltrate or effusion. Stable cardiomediastinal silhouette. No pneumothorax.  IMPRESSION: Stable borderline to mild cardiomegaly.  No edema.  No infiltrate. Electronically Signed   By: Donavan Foil M.D.   On: 01/15/2017 23:38    ASSESSMENT / PLAN:  Bilateral pulmonary emboli including saddle PE: PE extensive on CT. Clinically she is stable on 2 L, breathing comfortably and speaking in full paragraphs without being winded. Surprising, given her presentation of syncope, and her diagnostic workup this far, which includes relatively significant RV strain, elevated troponin, and elevated BNP at [facility]. These findings are consistent with submassive/massive PE and are certainly worrisome despite her clinical appearance at this time. - Stable to remain in SDU at this time - Continue supplemental O2 as needed to keep SpO2 > 92% - Continue heparin infusion - Will consult IR to consider catheter directed thrombolysis. Location (ICH/SDH/EDH) unclear of prior "brain bleed" and was traumatic so may be lytic option for her - Echocardiogram - Venous dopplers of legs - Trend troponin, assess lactic, BNP  Georgann Housekeeper, AGACNP-BC [place] [facility]/Critical Care Pager [phone] or [phone]  01/16/2017 12:26 AM  Attending Note:  82 year old female with PMH of heart failure who presents to the hospital for syncopal episode.  Patient went to [place] [facility] and a PE was noted on at CT that I reviewed myself.  Patient was transferred to [facility] for EKOS lytic therapy.  Patient evidently has a distant traumatic brain bleed with no known details.  On exam, patient is on 2L [place] with sats of 100% and clear lungs.  SBP in the 100-120 with HR of 85.  Patient had a troponin of 0.38.  PESI score of 90 with 30 day mortality of 3.2-7.1%.  eMD consulted IR and they will evaluate patient in AM and decide on use of EKOS.  Keep in the ICU overnight for observation given saddle embolus.  PCCM will admit.  The patient is critically ill with multiple organ systems failure and requires high  complexity decision making for assessment and support, frequent evaluation and titration of therapies, application of advanced monitoring technologies and extensive interpretation of multiple databases.   Critical Care Time devoted to patient care services described in this note is  45  Minutes. This time reflects time of care of this signee Dr Jennet Maduro. This critical care time does not reflect procedure time, or teaching time or supervisory time of PA/NP/Med student/Med Resident etc but could involve care discussion time.  Rush Farmer, M.D. [facility] Pulmonary/Critical Care Medicine. Pager: [phone]. After hours pager: [phone].

## 2017-01-16 NOTE — Progress Notes (Signed)
*  PRELIMINARY RESULTS* Vascular Ultrasound Bilateral lower extremity venous duplex has been completed.  Preliminary findings: The right lower extremity is positive for deep vein thrombosis in the right posteriotibial trunk and posterior tibial veins.  Other visualized vessels appear negative for thrombosis bilaterally.  Preliminary results given to nurse, Mellody Drown @ 15:30. Everrett Coombe 01/16/2017, 3:24 PM

## 2017-01-16 NOTE — Progress Notes (Signed)
ANTICOAGULATION CONSULT NOTE - Follow Up Consult  Pharmacy Consult for Heparin Indication: pulmonary embolus  Allergies  Allergen Reactions  . Aspirin Other (See Comments)    GI bleed  . Nsaids Other (See Comments)    GI bleed  . Penicillins Shortness Of Breath and Rash  . Simvastatin Nausea Only  . Vancomycin Rash    Patient Measurements: Height: 5\' 5"  (165.1 cm) Weight: 235 lb 10.8 oz (106.9 kg) IBW/kg (Calculated) : 57 Heparin Dosing Weight: 79 Kg  Vital Signs: Temp: 97.8 F (36.6 C) (03/08 1126) Temp Source: Oral (03/08 1126) BP: 82/65 (03/08 1000) Pulse Rate: 81 (03/08 1000)  Labs:  Recent Labs  01/15/17 2338 01/16/17 0003 01/16/17 0545 01/16/17 1126  HGB 13.8  --  12.6  --   HCT 43.3  --  40.2  --   PLT 90*  --  99*  --   LABPROT  --  15.3*  --   --   INR  --  1.20  --   --   HEPARINUNFRC  --  0.87*  --  0.67  CREATININE 1.40*  --  1.35*  --   TROPONINI 0.38*  --  0.30*  --     Estimated Creatinine Clearance: 40.4 mL/min (by C-G formula based on SCr of 1.35 mg/dL (H)).   Medications:  Infusions:  . sodium chloride 100 mL/hr at 01/16/17 1149  . heparin 850 Units/hr (01/16/17 0200)    Assessment: 82 year old female from [facility] hospital with extensive bilateral pulmonary embolism with R heart strain on CT. Heparin was initiated.   Heparin level is therapeutic at upper end of goal - 0.67 on 850 units/hr.  No bleeding or issues with line per RN.   Goal of Therapy:  Heparin level 0.3-0.7 units/ml Monitor platelets by anticoagulation protocol: Yes   Plan:  Continue heparin at 850 units/hr Heparin level and CBC in 8 hours Daily heparin level and CBC while on therapy Monitor for signs and symptoms of bleeding   Sloan Leiter, PharmD, BCPS Clinical Pharmacist Clinical phone 01/16/2017 until 3:30 PM - #[phone] After hours, please call [phone] 01/16/2017,12:13 PM

## 2017-01-16 NOTE — Progress Notes (Signed)
Pt transferred to [id number] with rapid response RN assist.  Bedside report given to Izola Price, RN & pt left in his care at this time.

## 2017-01-16 NOTE — Progress Notes (Signed)
Critical care MD notified pt's doppler BP-70/38 s/p total of 3 liters NS bolus.  Verbal order received to transfer pt to ICU.  Waiting for bed placement at this time.  Dr. Hal Hope updated.

## 2017-01-17 DIAGNOSIS — I2602 Saddle embolus of pulmonary artery with acute cor pulmonale: Principal | ICD-10-CM

## 2017-01-17 LAB — BASIC METABOLIC PANEL
ANION GAP: 11 (ref 5–15)
BUN: 23 mg/dL — ABNORMAL HIGH (ref 6–20)
CALCIUM: 8.6 mg/dL — AB (ref 8.9–10.3)
CO2: 20 mmol/L — ABNORMAL LOW (ref 22–32)
Chloride: 107 mmol/L (ref 101–111)
Creatinine, Ser: 1.13 mg/dL — ABNORMAL HIGH (ref 0.44–1.00)
GFR, EST AFRICAN AMERICAN: 52 mL/min — AB (ref >60)
GFR, EST NON AFRICAN AMERICAN: 45 mL/min — AB (ref >60)
Glucose, Bld: 120 mg/dL — ABNORMAL HIGH (ref 65–99)
Potassium: 3.9 mmol/L (ref 3.5–5.1)
Sodium: 138 mmol/L (ref 135–145)

## 2017-01-17 LAB — CBC
HCT: 41 % (ref 36.0–46.0)
Hemoglobin: 13.1 g/dL (ref 12.0–15.0)
MCH: 30.5 pg (ref 26.0–34.0)
MCHC: 32 g/dL (ref 30.0–36.0)
MCV: 95.3 fL (ref 78.0–100.0)
PLATELETS: 103 10*3/uL — AB (ref 150–400)
RBC: 4.3 MIL/uL (ref 3.87–5.11)
RDW: 15.9 % — ABNORMAL HIGH (ref 11.5–15.5)
WBC: 6.2 10*3/uL (ref 4.0–10.5)

## 2017-01-17 LAB — HEMOGLOBIN A1C
HEMOGLOBIN A1C: 6.7 % — AB (ref 4.8–5.6)
MEAN PLASMA GLUCOSE: 146 mg/dL

## 2017-01-17 LAB — HEPARIN LEVEL (UNFRACTIONATED)
HEPARIN UNFRACTIONATED: 0.72 [IU]/mL — AB (ref 0.30–0.70)
Heparin Unfractionated: 0.57 IU/mL (ref 0.30–0.70)

## 2017-01-17 NOTE — Progress Notes (Addendum)
ANTICOAGULATION CONSULT NOTE - Follow Up Consult  Pharmacy Consult for Heparin Indication: pulmonary embolus  Allergies  Allergen Reactions  . Aspirin Other (See Comments)    GI bleed  . Nsaids Other (See Comments)    GI bleed  . Penicillins Shortness Of Breath and Rash  . Simvastatin Nausea Only  . Vancomycin Rash    Patient Measurements: Height: 5\' 5"  (165.1 cm) Weight: 235 lb 10.8 oz (106.9 kg) IBW/kg (Calculated) : 57 Heparin Dosing Weight: 79 Kg  Vital Signs: Temp: 97.5 F (36.4 C) (03/08 2346) Temp Source: Oral (03/08 2346) BP: 115/72 (03/09 0700) Pulse Rate: 75 (03/09 0700)  Labs:  Recent Labs  01/15/17 2338 01/16/17 0003 01/16/17 0545  01/16/17 1224 01/16/17 2018 01/17/17 0529 01/17/17 1222  HGB 13.8  --  12.6  --   --  12.4 13.1  --   HCT 43.3  --  40.2  --   --  38.8 41.0  --   PLT 90*  --  99*  --   --  97* 103*  --   LABPROT  --  15.3*  --   --   --   --   --   --   INR  --  1.20  --   --   --   --   --   --   HEPARINUNFRC  --  0.87*  --   < >  --  0.62 0.72* 0.57  CREATININE 1.40*  --  1.35*  --   --   --  1.13*  --   TROPONINI 0.38*  --  0.30*  --  0.23*  --   --   --   < > = values in this interval not displayed.  Estimated Creatinine Clearance: 48.3 mL/min (by C-G formula based on SCr of 1.13 mg/dL (H)).   Medications:  Infusions:  . heparin 850 Units/hr (01/17/17 0700)   Assessment: 82 year old female from [place] with extensive bilateral pulmonary embolism with R heart strain on CT. Heparin was initiated. No bleeding or infusion related issues reported from RN.  Heparin level remains therapeutic: 0.72 CBC remains stable  No bleeding or issues with line per RN  Goal of Therapy:  Heparin level 0.3-0.7 units/ml Monitor platelets by anticoagulation protocol: Yes   Plan:  Heparin 850 units/hr Daily heparin level and CBC while on therapy Monitor for signs and symptoms of bleeding  Andrey Cota. Diona Foley, PharmD, BCPS Clinical  Pharmacist [phone] 01/17/2017 7:33 AM  ADDN: Repeat heparin level remains therapeutic at 0.57 on heparin 850 units/hr. Will continue and recheck with AM labs.  Andrey Cota. Diona Foley, PharmD, [place] Clinical Pharmacist [phone]

## 2017-01-17 NOTE — Progress Notes (Addendum)
PCCM Progress Note  Admission date: 01/15/2017 Consult date: 01/16/2017 Referring provider: Dr. Hal Hope, [facility]  CC: syncope  HPI: 82 yo female had Rt knee injury on 12/11/16 associated with swelling and immobility.  Developed syncope, and found to have saddle PE.  She has hx of ICH in 2009 and massive GI bleed in 2010.  Subjective: Still gets dizzy if she sits up too quick.  Denies chest pain, dyspnea, nausea.  Vital signs: BP 126/82   Pulse 76   Temp 97.9 F (36.6 C) (Oral)   Resp (!) 21   Ht 5\' 5"  (1.651 m)   Wt 235 lb 10.8 oz (106.9 kg)   SpO2 96%   BMI 39.22 kg/m   Intake/output: I/O last 3 completed shifts: In: 5515.5 [P.O.:1110; I.V.:4375.5; Other:30] Out: 560 [Urine:560]  General: pleasant Neuro: alert HEENT: no stridor Cardiac: regular, no murmur Chest: no wheeze Abd: soft, non tender Ext: 2+ edema Skin: no rashes   CMP Latest Ref Rng & Units 01/17/2017 01/16/2017 01/15/2017  Glucose 65 - 99 mg/dL 120(H) 139(H) 127(H)  BUN 6 - 20 mg/dL 23(H) 28(H) 29(H)  Creatinine 0.44 - 1.00 mg/dL 1.13(H) 1.35(H) 1.40(H)  Sodium 135 - 145 mmol/L 138 139 138  Potassium 3.5 - 5.1 mmol/L 3.9 4.0 4.1  Chloride 101 - 111 mmol/L 107 109 102  CO2 22 - 32 mmol/L 20(L) 19(L) 19(L)  Calcium 8.9 - 10.3 mg/dL 8.6(L) 7.8(L) 8.3(L)  Total Protein 6.5 - 8.1 g/dL - - 5.7(L)  Total Bilirubin 0.3 - 1.2 mg/dL - - 1.0  Alkaline Phos 38 - 126 U/L - - 138(H)  AST 15 - 41 U/L - - 94(H)  ALT 14 - 54 U/L - - 58(H)     CBC Latest Ref Rng & Units 01/17/2017 01/16/2017 01/16/2017  WBC 4.0 - 10.5 K/uL 6.2 7.0 7.7  Hemoglobin 12.0 - 15.0 g/dL 13.1 12.4 12.6  Hematocrit 36.0 - 46.0 % 41.0 38.8 40.2  Platelets 150 - 400 K/uL 103(L) 97(L) 99(L)     ABG No results found for: PHART, PCO2ART, PO2ART, HCO3, TCO2, ACIDBASEDEF, O2SAT   CBG (last 3)   Recent Labs  01/16/17 0753 01/16/17 1125 01/16/17 1538  GLUCAP 115* 112* 133*     Imaging: Dg Chest Port 1 View  Result Date: 01/15/2017 CLINICAL  DATA:  Shortness of breath EXAM: PORTABLE CHEST 1 VIEW COMPARISON:  01/15/2017, FINDINGS: No acute infiltrate or effusion. Stable cardiomediastinal silhouette. No pneumothorax. IMPRESSION: Stable borderline to mild cardiomegaly.  No edema.  No infiltrate. Electronically Signed   By: Donavan Foil M.D.   On: 01/15/2017 23:38     Studies: CT angio chest 01/15/17 >> saddle PE, RV:LV ratio 2.57 Echo 3/08 >> EF 55 to 60%, grade 1 DD, severe RV systolic dysfx, mod/severe TR, PAS 64 mmHg Doppler legs b/l 3/08 >> DVT Rt leg  Events: 3/07 Transfer from [facility] 3/09 Transfer to SDU  Summary: 82 yo female with submassive PE.  She has hx of ICH and massive GI bleeding.  Risks of thrombolytic therapy outweighed benefit.  Assessment/plan:  Acute submassive saddle PE with Rt leg DVT. - continue heparin gtt - likely can transition to oral anticoagulation in next 48 to 72 hours - keep on bed rest for next 24 to 48 hours, and then slowly liberalize activity as tolerated  Thrombocytopenia. - f/u CBC  Hx of GI bleed. - continue protonix  Hx of HTN, HLD. - lipitor - hold outpt lasix, toprol  DVT prophylaxis - heparin gtt SUP -  Protonix Diet - health healthy diet Goals of care - full code  Transfer to SDU >> To [facility] 3/10 and PCCM off  Chesley Mires, MD [place] 01/17/2017, 12:13 PM Pager:  [phone] After 3pm call: [phone]

## 2017-01-18 ENCOUNTER — Inpatient Hospital Stay (HOSPITAL_COMMUNITY): Payer: Medicare PPO

## 2017-01-18 DIAGNOSIS — I5032 Chronic diastolic (congestive) heart failure: Secondary | ICD-10-CM

## 2017-01-18 DIAGNOSIS — I4891 Unspecified atrial fibrillation: Secondary | ICD-10-CM

## 2017-01-18 DIAGNOSIS — R000 Tachycardia, unspecified: Secondary | ICD-10-CM

## 2017-01-18 DIAGNOSIS — I5030 Unspecified diastolic (congestive) heart failure: Secondary | ICD-10-CM

## 2017-01-18 DIAGNOSIS — I480 Paroxysmal atrial fibrillation: Secondary | ICD-10-CM

## 2017-01-18 DIAGNOSIS — R748 Abnormal levels of other serum enzymes: Secondary | ICD-10-CM

## 2017-01-18 DIAGNOSIS — I472 Ventricular tachycardia: Secondary | ICD-10-CM

## 2017-01-18 DIAGNOSIS — R778 Other specified abnormalities of plasma proteins: Secondary | ICD-10-CM

## 2017-01-18 DIAGNOSIS — R7989 Other specified abnormal findings of blood chemistry: Secondary | ICD-10-CM

## 2017-01-18 DIAGNOSIS — I82401 Acute embolism and thrombosis of unspecified deep veins of right lower extremity: Secondary | ICD-10-CM

## 2017-01-18 DIAGNOSIS — I519 Heart disease, unspecified: Secondary | ICD-10-CM

## 2017-01-18 DIAGNOSIS — I2602 Saddle embolus of pulmonary artery with acute cor pulmonale: Secondary | ICD-10-CM

## 2017-01-18 HISTORY — PX: IR GENERIC HISTORICAL: IMG1180011

## 2017-01-18 LAB — BASIC METABOLIC PANEL
ANION GAP: 10 (ref 5–15)
BUN: 26 mg/dL — ABNORMAL HIGH (ref 6–20)
CO2: 23 mmol/L (ref 22–32)
Calcium: 8.9 mg/dL (ref 8.9–10.3)
Chloride: 102 mmol/L (ref 101–111)
Creatinine, Ser: 1.28 mg/dL — ABNORMAL HIGH (ref 0.44–1.00)
GFR calc Af Amer: 45 mL/min — ABNORMAL LOW (ref >60)
GFR calc non Af Amer: 38 mL/min — ABNORMAL LOW (ref >60)
Glucose, Bld: 163 mg/dL — ABNORMAL HIGH (ref 65–99)
Potassium: 4.4 mmol/L (ref 3.5–5.1)
SODIUM: 135 mmol/L (ref 135–145)

## 2017-01-18 LAB — MAGNESIUM: MAGNESIUM: 1.7 mg/dL (ref 1.7–2.4)

## 2017-01-18 LAB — CBC
HCT: 40.3 % (ref 36.0–46.0)
HEMOGLOBIN: 12.6 g/dL (ref 12.0–15.0)
MCH: 29.4 pg (ref 26.0–34.0)
MCHC: 31.3 g/dL (ref 30.0–36.0)
MCV: 94.2 fL (ref 78.0–100.0)
Platelets: 96 10*3/uL — ABNORMAL LOW (ref 150–400)
RBC: 4.28 MIL/uL (ref 3.87–5.11)
RDW: 15.3 % (ref 11.5–15.5)
WBC: 10 10*3/uL (ref 4.0–10.5)

## 2017-01-18 LAB — HEPARIN LEVEL (UNFRACTIONATED): Heparin Unfractionated: 0.61 IU/mL (ref 0.30–0.70)

## 2017-01-18 MED ORDER — AMIODARONE HCL IN DEXTROSE 360-4.14 MG/200ML-% IV SOLN
30.0000 mg/h | INTRAVENOUS | Status: DC
  Administered 2017-01-19 (×2): 30 mg/h via INTRAVENOUS
  Filled 2017-01-18 (×6): qty 200

## 2017-01-18 MED ORDER — SODIUM CHLORIDE 0.9% FLUSH
3.0000 mL | Freq: Two times a day (BID) | INTRAVENOUS | Status: DC
  Administered 2017-01-19 – 2017-01-20 (×2): 3 mL via INTRAVENOUS

## 2017-01-18 MED ORDER — HYDRALAZINE HCL 20 MG/ML IJ SOLN: 10.0000 mg | INTRAMUSCULAR | Status: DC | PRN

## 2017-01-18 MED ORDER — MIDAZOLAM HCL 2 MG/2ML IJ SOLN
INTRAMUSCULAR | Status: AC | PRN
  Administered 2017-01-18: 1 mg via INTRAVENOUS
  Administered 2017-01-18: 0.5 mg via INTRAVENOUS

## 2017-01-18 MED ORDER — AMIODARONE HCL IN DEXTROSE 360-4.14 MG/200ML-% IV SOLN
60.0000 mg/h | INTRAVENOUS | Status: AC
  Administered 2017-01-18 (×2): 60 mg/h via INTRAVENOUS
  Filled 2017-01-18: qty 200

## 2017-01-18 MED ORDER — SODIUM CHLORIDE 0.9 % IV SOLN
INTRAVENOUS | Status: DC
  Administered 2017-01-18: 23:00:00 via INTRAVENOUS

## 2017-01-18 MED ORDER — FENTANYL CITRATE (PF) 100 MCG/2ML IJ SOLN
INTRAMUSCULAR | Status: AC
  Filled 2017-01-18: qty 2

## 2017-01-18 MED ORDER — AMIODARONE LOAD VIA INFUSION
150.0000 mg | Freq: Once | INTRAVENOUS | Status: AC
  Administered 2017-01-18: 150 mg via INTRAVENOUS

## 2017-01-18 MED ORDER — DILTIAZEM HCL 25 MG/5ML IV SOLN
10.0000 mg | Freq: Once | INTRAVENOUS | Status: DC
  Filled 2017-01-18 (×2): qty 5

## 2017-01-18 MED ORDER — LIDOCAINE HCL (PF) 1 % IJ SOLN
INTRAMUSCULAR | Status: AC
  Filled 2017-01-18: qty 10

## 2017-01-18 MED ORDER — FENTANYL CITRATE (PF) 100 MCG/2ML IJ SOLN
INTRAMUSCULAR | Status: AC | PRN
  Administered 2017-01-18 (×2): 25 ug via INTRAVENOUS

## 2017-01-18 MED ORDER — MIDAZOLAM HCL 2 MG/2ML IJ SOLN
INTRAMUSCULAR | Status: AC
  Filled 2017-01-18: qty 2

## 2017-01-18 MED ORDER — AMIODARONE HCL IN DEXTROSE 360-4.14 MG/200ML-% IV SOLN
INTRAVENOUS | Status: DC
Start: 2017-01-18 — End: 2017-01-18
  Filled 2017-01-18: qty 200

## 2017-01-18 MED ORDER — SODIUM CHLORIDE 0.9% FLUSH: 3.0000 mL | INTRAVENOUS | Status: DC | PRN

## 2017-01-18 MED ORDER — IOPAMIDOL (ISOVUE-300) INJECTION 61%
INTRAVENOUS | Status: AC
  Filled 2017-01-18: qty 50

## 2017-01-18 MED ORDER — SODIUM CHLORIDE 0.9 % IV SOLN: INTRAVENOUS | Status: DC

## 2017-01-18 MED ORDER — SODIUM CHLORIDE 0.9 % IV SOLN: 250.0000 mL | INTRAVENOUS | Status: DC | PRN

## 2017-01-18 MED ORDER — AMIODARONE HCL IN DEXTROSE 360-4.14 MG/200ML-% IV SOLN
INTRAVENOUS | Status: AC
  Filled 2017-01-18: qty 200

## 2017-01-18 MED ORDER — SODIUM CHLORIDE 0.9 % IV SOLN
INTRAVENOUS | Status: DC
  Administered 2017-01-18 (×2): via INTRAVENOUS

## 2017-01-18 MED ORDER — SODIUM CHLORIDE 0.9 % IV SOLN
12.0000 mg | Freq: Once | INTRAVENOUS | Status: AC
  Administered 2017-01-18: 12 mg via INTRAVENOUS
  Filled 2017-01-18: qty 12

## 2017-01-18 MED FILL — Medication: Qty: 1 | Status: AC

## 2017-01-18 NOTE — Progress Notes (Signed)
eLink Physician-Brief Progress Note Patient Name: MAXIMINA PIROZZI DOB: [birth year] MRN: [id number]   Date of Service  01/18/2017  HPI/Events of Note  Patient needs to go to 53M ICU bed post EKOS.   eICU Interventions  Will transfer to 53M ICU bed post EKOS.     Intervention Category Major Interventions: Other:  Lysle Dingwall 01/18/2017, 9:19 PM

## 2017-01-18 NOTE — Progress Notes (Signed)
ANTICOAGULATION CONSULT NOTE - Follow Up Consult  Pharmacy Consult for Heparin Indication: pulmonary embolus  Allergies  Allergen Reactions  . Aspirin Other (See Comments)    GI bleed  . Nsaids Other (See Comments)    GI bleed  . Penicillins Shortness Of Breath and Rash  . Simvastatin Nausea Only  . Vancomycin Rash    Patient Measurements: Height: 5\' 5"  (165.1 cm) Weight: 243 lb 9.6 oz (110.5 kg) IBW/kg (Calculated) : 57 Heparin Dosing Weight: 79 Kg  Vital Signs: Temp: 97.6 F (36.4 C) (03/10 0800) Temp Source: Oral (03/10 0800) BP: 106/65 (03/10 0800) Pulse Rate: 82 (03/10 0817)  Labs:  Recent Labs  01/15/17 2338 01/16/17 0003 01/16/17 0545  01/16/17 1224 01/16/17 2018 01/17/17 0529 01/17/17 1222 01/18/17 0356  HGB 13.8  --  12.6  --   --  12.4 13.1  --  12.6  HCT 43.3  --  40.2  --   --  38.8 41.0  --  40.3  PLT 90*  --  99*  --   --  97* 103*  --  96*  LABPROT  --  15.3*  --   --   --   --   --   --   --   INR  --  1.20  --   --   --   --   --   --   --   HEPARINUNFRC  --  0.87*  --   < >  --  0.62 0.72* 0.57 0.61  CREATININE 1.40*  --  1.35*  --   --   --  1.13*  --  1.28*  TROPONINI 0.38*  --  0.30*  --  0.23*  --   --   --   --   < > = values in this interval not displayed.  Estimated Creatinine Clearance: 43.4 mL/min (by C-G formula based on SCr of 1.28 mg/dL (H)).   Medications:  Infusions:  . heparin 850 Units/hr (01/17/17 0753)   Assessment: 82 year old female from [place] with extensive bilateral pulmonary embolism with R heart strain on CT. Heparin was initiated. No bleeding or infusion related issues reported from RN.  Heparin level remains therapeutic: 0.61 CBC remains stable  No bleeding or issues with line noted.   Goal of Therapy:  Heparin level 0.3-0.7 units/ml Monitor platelets by anticoagulation protocol: Yes   Plan:  Heparin 850 units/hr Daily heparin level and CBC while on therapy Monitor for signs and symptoms of  bleeding  Uvaldo Bristle, PharmD PGY1 Pharmacy Resident Pager: [phone] 01/18/2017 11:20 AM

## 2017-01-18 NOTE — Sedation Documentation (Signed)
Patient denies pain and is resting comfortably.  

## 2017-01-18 NOTE — Progress Notes (Signed)
Responded to Code Blue from ED Trauma.. Nurse advised that his pt had already been found alert enough throughout and stats stabilized enough to have code canceled and care returned to primary team. She said pt would be transferred to ICU and appreciated prompt response.   01/18/17 1700  Clinical Encounter Type  Visited With Health care provider  Visit Type Initial;Code  Referral From Nurse   Gerrit Heck, Chaplain

## 2017-01-18 NOTE — Procedures (Signed)
Catheter-directed ultrasound-assisted bilateral pulmonary arterial thrombolysis initiated via R IJ x2 No complication No blood loss. See complete dictation in [facility].  Will follow,  check PA pressures in AM

## 2017-01-18 NOTE — Progress Notes (Addendum)
Called by CCMD patient monitor displaying V.tach. Upon arrival to the room, patient was AAOx4 eating dinner but complain of CP and SOB. Called for help.Rapid Response nurse by the bedside. Dr Clementeen Graham by the bedside.Code blue team by the bedside. Patient remained conscious and meds given per code blue sheet. Family updated and patient transferred to [id number].

## 2017-01-18 NOTE — Consult Note (Addendum)
Admit date: 01/15/2017 Referring Physician  Dr. Clementeen Graham Primary Physician  None Primary Cardiologist  None Reason for Consultation  [facility] and afib with RVR  HPI:  This is an 82 y.o. female with history of HTN, fall with "traumatic head bleed in 2009", diverticulitis with GIB "10years ago" and chronic diastolic CHF (ECHO 3/8 normal LVF with G1DD and moderate pulmonary HTN PASP 73mmHg, severely dilated RV with severe RV dysfunction) who presented to the ER after a syncopal episode.   Yesterday, while walking in her house, she had a brief episode of syncope. Patient states last month she torn her right knee ligament and was hospitalized for 2 days and then went to rehabilitation for 16 days and has been at home for 1 week.. On the morning prior to admission she was not been feeling well and was having mild chest pressure and shortness of breath. She awoke on 3/7 and felt ok and went into the kitchen to make breakfast.  She felt lightheaded and sat down and called her neighbor and when she go up again had a episode of syncope and apparently awakened 10 minutes later when her neighbor arrived. Her neighbor called EMS.  She was was brought to the ER and CT scan of the chest showed bilateral pulmonary saddle embolism with RV strain. Patient was started on heparin. Troponin was mildly elevated with lactate 4.5. She was transferred to [facility] and on arrival was hypotensive with SBP in the 39'J systolic. She was given  2L NS bolus with improvement in BP but over the next 24 hours continued to have problems with hypotension requiring IVF.  Initial troponin was 0.38.  Due to prior head bleed it was felt that risks of EKOS outweighed the benefits and was not pursued. LE venous duples was positive for DVT in the right posteriotibial trunk and posterior tibial veins.  Cardiology was called this evening because the patient developed short bursts of wide complex tachycardia intermixed with afib with RVR while  eating dinner and complained of chest pain and SOB.  Rapid response was called.  She was given a bolus of Amio 150mg  and started on Amio gtt and transferred to CCU.  Currently she is in atrial fibrillation with HR in 120's and SBP 166mmHg.  She has no complaints at present.     PMH:   Past Medical History:  Diagnosis Date  . CHF (congestive heart failure) (HCC)      PSH:   Past Surgical History:  Procedure Laterality Date  . APPENDECTOMY    . CHOLECYSTECTOMY    . KNEE SURGERY    . rotator cuff surgery      Allergies:  Aspirin; Nsaids; Penicillins; Simvastatin; and Vancomycin Prior to Admit Meds:   Prescriptions Prior to Admission  Medication Sig Dispense Refill Last Dose  . acetaminophen (TYLENOL) 325 MG tablet Take 650 mg by mouth every 4 (four) hours as needed for moderate pain or headache.    Past Week at Unknown time  . atorvastatin (LIPITOR) 40 MG tablet Take 40 mg by mouth at bedtime.   Past Week at Unknown time  . Camphor (JOINTFLEX EX) Apply 1 application topically as needed (for pain).   Past Week at Unknown time  . furosemide (LASIX) 40 MG tablet Take 40 mg by mouth daily.   Past Week at Unknown time  . latanoprost (XALATAN) 0.005 % ophthalmic solution Place 1 drop into both eyes at bedtime.   Past Week at Unknown time  .  loperamide (IMODIUM A-D) 2 MG tablet Take 2 mg by mouth 4 (four) times daily as needed for diarrhea or loose stools.   Past Week at Unknown time  . LORazepam (ATIVAN) 0.5 MG tablet Take 0.5 mg by mouth every 8 (eight) hours as needed for anxiety.   Past Month at Unknown time  . metoprolol succinate (TOPROL-XL) 25 MG 24 hr tablet Take 25 mg by mouth daily.   Past Week at Unknown time  . omeprazole (PRILOSEC) 40 MG capsule Take 40 mg by mouth daily.   Past Week at Unknown time  . potassium chloride SA (K-DUR,KLOR-CON) 20 MEQ tablet Take 20 mEq by mouth daily.   Past Week at Unknown time  . traMADol (ULTRAM) 50 MG tablet Take 50 mg by mouth every 6 (six) hours  as needed for moderate pain.    Past Week at Unknown time   Fam HX:    Family History  Problem Relation Age of Onset  . CAD Father    Social HX:    Social History   Social History  . Marital status: Unknown    Spouse name: N/A  . Number of children: N/A  . Years of education: N/A   Occupational History  . Not on file.   Social History Main Topics  . Smoking status: Never Smoker  . Smokeless tobacco: Never Used  . Alcohol use No  . Drug use: No  . Sexual activity: Not on file   Other Topics Concern  . Not on file   Social History Narrative  . No narrative on file     ROS:  All 11 ROS were addressed and are negative except what is stated in the HPI  Physical Exam: Blood pressure 111/75, pulse (!) 117, temperature 97.9 F (36.6 C), temperature source Axillary, resp. rate (!) 22, height 5\' 5"  (1.651 m), weight 249 lb 12.5 oz (113.3 kg), SpO2 92 %.    General: Well developed, well nourished, in no acute distress Head: Eyes PERRLA, No xanthomas.   Normal cephalic and atramatic  Lungs:   Clear bilaterally to auscultation and percussion. Heart:   Irregularly irregular S1 S2 Pulses are 2+ & equal.            No carotid bruit. No JVD.  No abdominal bruits. No femoral bruits. Abdomen: Bowel sounds are positive, abdomen soft and non-tender without masses or                  Hernia's noted. Msk:  Back normal, normal gait. Normal strength and tone for age. Extremities:   No clubbing, cyanosis or edema.  DP +1 Neuro: Alert and oriented X 3. Psych:  Good affect, responds appropriately    Labs:   Lab Results  Component Value Date   WBC 10.0 01/18/2017   HGB 12.6 01/18/2017   HCT 40.3 01/18/2017   MCV 94.2 01/18/2017   PLT 96 (L) 01/18/2017    Recent Labs Lab 01/15/17 2338  01/18/17 0356  NA 138  < > 135  K 4.1  < > 4.4  CL 102  < > 102  CO2 19*  < > 23  BUN 29*  < > 26*  CREATININE 1.40*  < > 1.28*  CALCIUM 8.3*  < > 8.9  PROT 5.7*  --   --   BILITOT 1.0  --    --   ALKPHOS 138*  --   --   ALT 58*  --   --   AST  94*  --   --   GLUCOSE 127*  < > 163*  < > = values in this interval not displayed. No results found for: PTT Lab Results  Component Value Date   INR 1.20 01/16/2017   Lab Results  Component Value Date   TROPONINI 0.23 ([place]) 01/16/2017    No results found for: CHOL No results found for: HDL No results found for: LDLCALC No results found for: TRIG No results found for: CHOLHDL No results found for: LDLDIRECT    Radiology:  No results found.   Telemetry    Atrial fibrillation with RVR.  Several bursts of wide complex tachycardia concerning for ventricular tachycardia as they were very regular.   - Personally Reviewed  ECG    NSR with inferior infarct and low voltage in limb leads.  Cannot rule out prior anterior infarct.  Nonspecific ST abnormality - Personally Reviewed  2D echo Study Conclusions  - Left ventricle: The cavity size was normal. Systolic function was   normal. The estimated ejection fraction was in the range of 55%   to 60%. Wall motion was normal; there were no regional wall   motion abnormalities. Doppler parameters are consistent with   abnormal left ventricular relaxation (grade 1 diastolic   dysfunction). - Ventricular septum: Septal motion showed paradox. These changes   are consistent with RV-LV interaction. - Left atrium: The atrium was mildly dilated. - Right ventricle: The cavity size was dilated. Wall thickness was   normal. Systolic function was severely reduced. - Right atrium: The atrium was moderately dilated. - Tricuspid valve: There was moderate-severe regurgitation. - Pulmonary arteries: Systolic pressure was moderately increased.   PA peak pressure: 64 mm Hg (S).  ASSESSMENT/PLAN:   1.  Acute bilateral massive pulmonary emboli with saddle embolism and acute RV strain complicated by profound hypotension and syncope.  Multiple boluses of IVF required over the first 48 hours to  maintain adequate BP.  Due to prior head bleed it was felt that risks of EKOS outweighed the benefits and was not pursued. LE venous duples was positive for DVT in the right posteriotibial trunk and posterior tibial veins. She is currently on IV Heparin with plan to start Coumadin in the next day or so.    2.  Acute RV failure with moderate to severe TR and moderate pulmonary HTN due to #1.  Still having intermittent low BPs responding to IVF bolus.  3.  New onset atrial fibrillation with RVR and associated wide complex tachycardia worrisome for NSVT.  It looked like it started with a fusion beat and was very regular and broke back out into atrial fibrillation.  She now has a RBBB in atrial fibrillation which is new and likely due to RV strain.  She continues to have wider complex beats intermittently that are either PVCs or aberration.  HR improved on IV Amio gtt but still in the 120's.  Will give an additional bolus of 150mg  Amio.  Try to keep K>4.  Will check Mag level. BP too soft for BB at this time.    3.  Moderate pulmonary HTN secondary to acute PE and RHF.    4.  Chronic diastolic CHF - she does not have any evidence of volume overload on exam at present. Will continue to monitor for volume overload as she has been receiving large fluid boluses intermittently to maintain BP.    5.  Elevated troponin with flat trend related to acute RV strain and hypotension. She  did have CP at the time of her onset of afib with RVR but denies any at present.  Will continue to cycle trops.  She is on Heparin.  BP too soft for BB.  LVF was normal on echo.  She does have a family history of CAD in her Dad at 85 and her brother had a defibrillator.    I am concerned that her episode of CP with hypotension and ventricular arrhythmias tonight may represent ongoing embolization from her extensive clot burden in her legs.  I have spoken with the CCM fellow on call concerning consideration of placing an IVC filter  tonight to prevent further PE.  There has been hesitation to proceed with EKOS due to history of bleed in brain in the past although the patient told CCM fellow it was traumatic due to a fall.  She already has acute cor pulmonale and further embolization could result in cardiogenic shock and death.    Fransico Him, MD  01/20/17  6:48 PM

## 2017-01-18 NOTE — Progress Notes (Addendum)
CODE BLUE called for concern of SVT and possible VTAC with drop in SBP to 90s.. Strip showing ? afib with RBBB. Given IV amiodarone bolus and started on amiodarone drip. Placed on NRB. Patient fully alert and oriented.  12 lead EKG shows Afib with RBBB. Labs ordered. 1L IV NS bolus ordered.  Given 2 gm magnesium IV and 10 mg IV cardizem ordere after BP improved.  PCCM ( Dr Corinna Lines) and cardiology ( Dr Radford Pax) consulted.   patient transferred to ICU. Sister notified on the phone.

## 2017-01-18 NOTE — Significant Event (Signed)
Rapid Response Event Note  Overview: Time Called: 1747 Arrival Time: 1595 Event Type: Cardiac  Initial Focused Assessment: Patient with sudden on set of SVT, wide complex tachycardia.  She is cool and clammy, SOB and Hypotensive  HR 180-200s Code Blue team at bedside.  Dr Clementeen Graham at bedside  Interventions: Placed on NRB  150mg  Amio bolus x 2 Amiodarone gtt started Restarted Heparin gtt 2gm Mag given IV Zofran given IV for nausea and dry heaving NS bolus infusing HR improved to 110-120s  BP 105/78  12 lead EKG done  Transported patient to 4N Order received for 10mg  Cardizem bolus, will given on arrival to ICU  Plan of Care (if not transferred):  Event Summary: Name of Physician Notified: Dr Clementeen Graham at 1712  Name of Consulting Physician Notified: CCM, Dr Wallis Bamberg at [place]  Outcome: Transferred (Comment)     Raliegh Ip

## 2017-01-18 NOTE — Progress Notes (Signed)
PROGRESS NOTE                                                                                                                                                                                                             Patient Demographics:    Jennifer Medina, is a 82 y.o. female, DOB - 07-05-1935, GXQ:[id number]  Admit date - 01/15/2017   Admitting Physician Rigoberto Noel, MD  Outpatient Primary MD for the patient is No primary care provider on file.  LOS - 3  Outpatient Specialists: none  No chief complaint on file.      Brief Narrative  82 year old female with history of diastolic CHF,? Intracranial hemorrhage (reported being admitted at [facility] in 2009), history of GI bleed (over 5 years back, admitted at [facility]) who about one month back tore her right knee ligament and was in rehabilitation for 2 weeks . Patient started having chest pressure with shortness of breath within episode of syncope at home. Her neighbors called EMS and was taken to [facility]. CT scan done in the ED showed submassive pulmonary embolism with right ventricular strain. She had mildly elevated troponin with lactic acid of 4.5. She was hypotensive. Transferred to [facility] for further management. PC CM consulted for EKOS and patient managed in the ICU.  Transferred to hospitalist service on 3/10.  Subjective:   Reports dyspnea on minimal exertion. Also gets dizzy on trying to sit up.   Assessment  & Plan :   Principal problem Acute submassive pulmonary embolism. On IV heparin. Vitals currently stable. Containing sats on 2 L via nasal cannula. Suspect she would desaturate with ambulation. Still gets dizzy on getting up.  - Continue IV heparin for now. Discussed with PC CM this morning. Given history of intracranial hemorrhage and GI bleed and history of falls at home the best anticoagulation for her would be Coumadin. Will start her  on it if she is remains medically stable over the weekend. -2-D echo shows EF of 14%, grade 1 diastolic dysfunction and severely reduced systolic function, increased PA pressure (64 mmHG) - acute DVT in the right lower leg seen on Doppler..  Thrombocytopenia Mild. Monitor for now.  History of CHF Appears euvolemic. 2-D echo reviewed. Continue statin.        Code Status : Full code  Family Communication  :  None at bedside. Patient lives alone  Disposition Plan  : Home pending clinical improvement, possibly sometime next week (once started on Coumadin she needs to be therapeutic)  Barriers For Discharge : Active symptoms  Consults  :   Pulmonary  Procedures  :  CT angiogram of the chest 2-D echo Doppler lower extremity  DVT Prophylaxis  :  IV heparin  Lab Results  Component Value Date   PLT 96 (L) 01/18/2017    Antibiotics  :    Anti-infectives    None        Objective:   Vitals:   01/18/17 0659 01/18/17 0800 01/18/17 0817 01/18/17 1222  BP: 114/68 106/65  124/80  Pulse: 75 73 82 78  Resp: 17 20 (!) 23 20  Temp:  97.6 F (36.4 C)  97.8 F (36.6 C)  TempSrc:  Oral  Oral  SpO2: 99% 99% 96% 97%  Weight:      Height:        Wt Readings from Last 3 Encounters:  01/18/17 110.5 kg (243 lb 9.6 oz)     Intake/Output Summary (Last 24 hours) at 01/18/17 1314 Last data filed at 01/18/17 0447  Gross per 24 hour  Intake            247.5 ml  Output              275 ml  Net            -27.5 ml     Physical Exam  TIW:[id number] HEENT: no pallor, moist mucosa, supple neck Chest: clear b/l, no added sounds CVS: N S1&S2, no murmurs, rubs or gallop GI: soft, NT, ND, BS+ Musculoskeletal: warm, no edema     Data Review:    CBC  Recent Labs Lab 01/15/17 2338 01/16/17 0545 01/16/17 2018 01/17/17 0529 01/18/17 0356  WBC 8.7 7.7 7.0 6.2 10.0  HGB 13.8 12.6 12.4 13.1 12.6  HCT 43.3 40.2 38.8 41.0 40.3  PLT 90* 99* 97* 103* 96*  MCV 95.2 95.3 93.9  95.3 94.2  MCH 30.3 29.9 30.0 30.5 29.4  MCHC 31.9 31.3 32.0 32.0 31.3  RDW 15.2 15.4 15.4 15.9* 15.3  LYMPHSABS 2.0  --   --   --   --   MONOABS 0.7  --   --   --   --   EOSABS 0.0  --   --   --   --   BASOSABS 0.0  --   --   --   --     Chemistries   Recent Labs Lab 01/15/17 2338 01/16/17 0545 01/17/17 0529 01/18/17 0356  NA 138 139 138 135  K 4.1 4.0 3.9 4.4  CL 102 109 107 102  CO2 19* 19* 20* 23  GLUCOSE 127* 139* 120* 163*  BUN 29* 28* 23* 26*  CREATININE 1.40* 1.35* 1.13* 1.28*  CALCIUM 8.3* 7.8* 8.6* 8.9  AST 94*  --   --   --   ALT 58*  --   --   --   ALKPHOS 138*  --   --   --   BILITOT 1.0  --   --   --    ------------------------------------------------------------------------------------------------------------------ No results for input(s): CHOL, HDL, LDLCALC, TRIG, CHOLHDL, LDLDIRECT in the last 72 hours.  Lab Results  Component Value Date   HGBA1C 6.7 (H) 01/15/2017   ------------------------------------------------------------------------------------------------------------------ No results for input(s): TSH, T4TOTAL, T3FREE, THYROIDAB in the last 72 hours.  Invalid input(s): FREET3 ------------------------------------------------------------------------------------------------------------------ No  results for input(s): VITAMINB12, FOLATE, FERRITIN, TIBC, IRON, RETICCTPCT in the last 72 hours.  Coagulation profile  Recent Labs Lab 01/16/17 0003  INR 1.20    No results for input(s): DDIMER in the last 72 hours.  Cardiac Enzymes  Recent Labs Lab 01/15/17 2338 01/16/17 0545 01/16/17 1224  TROPONINI 0.38* 0.30* 0.23*   ------------------------------------------------------------------------------------------------------------------    Component Value Date/Time   BNP 841.8 (H) 01/15/2017 2338    Inpatient Medications  Scheduled Meds: . atorvastatin  40 mg Oral QHS  . chlorhexidine  15 mL Mouth Rinse BID  . latanoprost  1 drop Both  Eyes QHS  . mouth rinse  15 mL Mouth Rinse q12n4p  . pantoprazole  40 mg Oral Daily   Continuous Infusions: . heparin 850 Units/hr (01/17/17 0753)   PRN Meds:.acetaminophen, LORazepam, [DISCONTINUED] ondansetron **OR** ondansetron (ZOFRAN) IV, traMADol  Micro Results Recent Results (from the past 240 hour(s))  MRSA PCR Screening     Status: None   Collection Time: 01/16/17  1:18 AM  Result Value Ref Range Status   MRSA by PCR NEGATIVE NEGATIVE Final    Comment:        The GeneXpert MRSA Assay (FDA approved for NASAL specimens only), is one component of a comprehensive MRSA colonization surveillance program. It is not intended to diagnose MRSA infection nor to guide or monitor treatment for MRSA infections.     Radiology Reports Dg Chest Port 1 View  Result Date: 01/15/2017 CLINICAL DATA:  Shortness of breath EXAM: PORTABLE CHEST 1 VIEW COMPARISON:  01/15/2017, FINDINGS: No acute infiltrate or effusion. Stable cardiomediastinal silhouette. No pneumothorax. IMPRESSION: Stable borderline to mild cardiomegaly.  No edema.  No infiltrate. Electronically Signed   By: Donavan Foil M.D.   On: 01/15/2017 23:38    Time Spent in minutes  35   Louellen Molder M.D on 01/18/2017 at 1:14 PM  Between 7am to 7pm - Pager - [phone]  After 7pm go to [email or web address] - password [facility]  [facility] -  Office  [phone]

## 2017-01-18 NOTE — Progress Notes (Deleted)
eLink Physician-Brief Progress Note Patient Name: Jennifer Medina DOB: December 21, 1934 MRN: [id number]   Date of Service  01/18/2017  HPI/Events of Note  Hypertension - BP = 249/134. Impending cerebral herniation? Will treat cautiously.   eICU Interventions  Will order: 1. Hydralazine 10 mg IV Q 4 hours PRN SBP > 170.     Intervention Category Major Interventions: Hypertension - evaluation and management  Lysle Dingwall 01/18/2017, 9:37 PM

## 2017-01-18 NOTE — Progress Notes (Signed)
Patient previously on PCCM service for submassive PE -- persistent hypotension and clincal instability -> back to ICU.  Called VIR re: ?EKOS catheter - I believe the benefits outweigh the risks. She apparently had a traumatic fall 10 years ago with cerebral hemorrhage.  Full note to follow  Luz Brazen, MD Pulmonary & Critical Care Medicine January 18, 2017, 8:54 PM

## 2017-01-18 NOTE — Sedation Documentation (Signed)
Mean pressure is 46/10(23)mmHg

## 2017-01-18 NOTE — Progress Notes (Signed)
Responded to a CODE BLUE, patient was found in shortness of breath with concern for V. tach on the monitor. When M.D. arrived patient was alert and talking without significant complaint of pain. She did have some shortness of breath was on nasal cannula oxygen with stable O2 sats. She was in unstable tachyarrhythmia SVT on the monitor, with frequent flips into V. tach. Patient's blood pressure dropped to 90 systolic but remained alert. Her team was notified and was at bedside. Patient received to 150 mL boluses of amiodarone. EKG was ordered, PCCM was notified. Patient's tachycardia had stabilized to the low 100s and care was handed off to the primary team.

## 2017-01-19 ENCOUNTER — Encounter (HOSPITAL_COMMUNITY): Payer: Self-pay | Admitting: Interventional Radiology

## 2017-01-19 ENCOUNTER — Inpatient Hospital Stay (HOSPITAL_COMMUNITY): Payer: Medicare PPO

## 2017-01-19 HISTORY — PX: IR GENERIC HISTORICAL: IMG1180011

## 2017-01-19 LAB — CBC
HEMATOCRIT: 38.2 % (ref 36.0–46.0)
HEMATOCRIT: 37.9 % (ref 36.0–46.0)
HEMATOCRIT: 36.3 % (ref 36.0–46.0)
HEMOGLOBIN: 12.4 g/dL (ref 12.0–15.0)
HEMOGLOBIN: 11.9 g/dL — AB (ref 12.0–15.0)
HEMOGLOBIN: 11.5 g/dL — AB (ref 12.0–15.0)
MCH: 30.5 pg (ref 26.0–34.0)
MCH: 29.4 pg (ref 26.0–34.0)
MCH: 29.6 pg (ref 26.0–34.0)
MCHC: 32.5 g/dL (ref 30.0–36.0)
MCHC: 31.4 g/dL (ref 30.0–36.0)
MCHC: 31.7 g/dL (ref 30.0–36.0)
MCV: 93.9 fL (ref 78.0–100.0)
MCV: 93.6 fL (ref 78.0–100.0)
MCV: 93.6 fL (ref 78.0–100.0)
Platelets: 90 10*3/uL — ABNORMAL LOW (ref 150–400)
Platelets: 75 10*3/uL — ABNORMAL LOW (ref 150–400)
Platelets: 77 10*3/uL — ABNORMAL LOW (ref 150–400)
RBC: 4.07 MIL/uL (ref 3.87–5.11)
RBC: 4.05 MIL/uL (ref 3.87–5.11)
RBC: 3.88 MIL/uL (ref 3.87–5.11)
RDW: 15.5 % (ref 11.5–15.5)
RDW: 15.7 % — ABNORMAL HIGH (ref 11.5–15.5)
RDW: 15.8 % — ABNORMAL HIGH (ref 11.5–15.5)
WBC: 10.1 10*3/uL (ref 4.0–10.5)
WBC: 8.4 10*3/uL (ref 4.0–10.5)
WBC: 7.7 10*3/uL (ref 4.0–10.5)

## 2017-01-19 LAB — FIBRINOGEN
Fibrinogen: 516 mg/dL — ABNORMAL HIGH (ref 210–475)
Fibrinogen: 603 mg/dL — ABNORMAL HIGH (ref 210–475)
Fibrinogen: 553 mg/dL — ABNORMAL HIGH (ref 210–475)
Fibrinogen: 562 mg/dL — ABNORMAL HIGH (ref 210–475)

## 2017-01-19 LAB — HEPARIN LEVEL (UNFRACTIONATED)
Heparin Unfractionated: 0.43 IU/mL (ref 0.30–0.70)
Heparin Unfractionated: 0.33 IU/mL (ref 0.30–0.70)
Heparin Unfractionated: 0.43 IU/mL (ref 0.30–0.70)
Heparin Unfractionated: 0.36 IU/mL (ref 0.30–0.70)

## 2017-01-19 NOTE — Progress Notes (Addendum)
ANTICOAGULATION CONSULT NOTE - Follow Up Consult  Pharmacy Consult for Heparin Indication: pulmonary embolus  Allergies  Allergen Reactions  . Aspirin Other (See Comments)    GI bleed  . Nsaids Other (See Comments)    GI bleed  . Penicillins Shortness Of Breath and Rash  . Simvastatin Nausea Only  . Vancomycin Rash    Patient Measurements: Height: 5\' 5"  (165.1 cm) Weight: 249 lb 12.5 oz (113.3 kg) IBW/kg (Calculated) : 57 Heparin Dosing Weight: 79 Kg  Vital Signs: Temp: 97.5 F (36.4 C) (03/11 0343) Temp Source: Oral (03/11 0343) BP: 126/72 (03/11 0400) Pulse Rate: 60 (03/11 0400)  Labs:  Recent Labs  01/16/17 0545  01/16/17 1224  01/17/17 0529  01/18/17 0356 01/18/17 2233 01/19/17 0549 01/19/17 0550  HGB 12.6  --   --   < > 13.1  --  12.6 12.4 11.9*  --   HCT 40.2  --   --   < > 41.0  --  40.3 38.2 37.9  --   PLT 99*  --   --   < > 103*  --  96* 90* 75*  --   HEPARINUNFRC  --   < >  --   < > 0.72*  < > 0.61 0.43  --  0.33  CREATININE 1.35*  --   --   --  1.13*  --  1.28*  --   --   --   TROPONINI 0.30*  --  0.23*  --   --   --   --   --   --   --   < > = values in this interval not displayed.  Estimated Creatinine Clearance: 44 mL/min (by C-G formula based on SCr of 1.28 mg/dL (H)).  Assessment: 82 year old female on heparin for extensive bilateral pulmonary embolism with R heart strain. Cath directed USAT (alteplase) initiated this p.m. in IR. Heparin level 0.43 (therapeutic) on gtt at 850 units/hr. Hgb remains stable, plt low but stable. No bleeding noted.  Goal of Therapy:  Heparin level 0.3-0.7 units/ml Monitor platelets by anticoagulation protocol: Yes   Plan:  Continue heparin 850 units/hr Will f/u q6h heparin levels x 24 hours   Sherlon Handing, PharmD, BCPS Clinical pharmacist, pager [phone] 01/19/2017 6:34 AM    Addendum (0630): Heparin level trending down to 0.33 (low end of therapeutic) - would like in upper half of range with large clot  burden. Continues on USAT with alteplase. Hgb down slightly and plt down to 75. RN reports small amount blood-tinged sputum (~1 hour ago) when pt spits but nothing since.  Plan: Increase heparin to 950 units/hr Will f/u 6 hr heparin level F/u further blood in sputum  Sherlon Handing, PharmD, BCPS Clinical pharmacist, pager [phone] 01/19/2017 6:36 AM

## 2017-01-19 NOTE — Progress Notes (Signed)
PULMONARY / CRITICAL CARE MEDICINE   Name: Jennifer Medina MRN: [id number] DOB: 02-10-1935    ADMISSION DATE:  01/15/2017  CHIEF COMPLAINT:  Syncope  HISTORY OF PRESENT ILLNESS:   See prior notes for complete history, but briefly, Jennifer Medina is a 82 y/o woman admitted with saddle PE, syncope, who developed hypotension and on 3/10 developed another syncopal episode with VT seen on the monitor. She was brought back to the ICU.  PAST MEDICAL HISTORY :  She  has a past medical history of CHF (congestive heart failure) ([place]).  PAST SURGICAL HISTORY: She  has a past surgical history that includes Cholecystectomy; Appendectomy; rotator cuff surgery; and Knee surgery.  Allergies  Allergen Reactions  . Aspirin Other (See Comments)    GI bleed  . Nsaids Other (See Comments)    GI bleed  . Penicillins Shortness Of Breath and Rash  . Simvastatin Nausea Only  . Vancomycin Rash    No current facility-administered medications on file prior to encounter.    No current outpatient prescriptions on file prior to encounter.    FAMILY HISTORY:  Her indicated that the status of her father is unknown.    SOCIAL HISTORY: She  reports that she has never smoked. She has never used smokeless tobacco. She reports that she does not drink alcohol or use drugs.   VITAL SIGNS: BP 129/77   Pulse 62   Temp 97.3 F (36.3 C) (Axillary)   Resp 18   Ht 5\' 5"  (1.651 m)   Wt 249 lb 12.5 oz (113.3 kg)   SpO2 99%   BMI 41.57 kg/m   HEMODYNAMICS:    VENTILATOR SETTINGS: FiO2 (%):  [100 %] 100 %  INTAKE / OUTPUT: I/O last 3 completed shifts: In: 298.5 [P.O.:120; I.V.:178.5] Out: 425 [Urine:425]  PHYSICAL EXAMINATION: General:  Elderly woman in mild distress Neuro:  Awake, alert, oriented HEENT:  MMM Cardiovascular:  Tachycardic, elevated JVD. Lungs:  CTA Abdomen:  Soft Musculoskeletal:  No deformities Skin:  Gross edema.  LABS:  BMET  Recent Labs Lab 01/16/17 0545  01/17/17 0529 01/18/17 0356  NA 139 138 135  K 4.0 3.9 4.4  CL 109 107 102  CO2 19* 20* 23  BUN 28* 23* 26*  CREATININE 1.35* 1.13* 1.28*  GLUCOSE 139* 120* 163*    Electrolytes  Recent Labs Lab 01/16/17 0545 01/17/17 0529 01/18/17 0356 01/18/17 2101  CALCIUM 7.8* 8.6* 8.9  --   MG  --   --   --  1.7    CBC  Recent Labs Lab 01/17/17 0529 01/18/17 0356 01/18/17 2233  WBC 6.2 10.0 10.1  HGB 13.1 12.6 12.4  HCT 41.0 40.3 38.2  PLT 103* 96* 90*    Coag's  Recent Labs Lab 01/16/17 0003  INR 1.20    Sepsis Markers  Recent Labs Lab 01/15/17 2338 01/16/17 0545  LATICACIDVEN 2.6* 2.0*    ABG No results for input(s): PHART, PCO2ART, PO2ART in the last 168 hours.  Liver Enzymes  Recent Labs Lab 01/15/17 2338  AST 94*  ALT 58*  ALKPHOS 138*  BILITOT 1.0  ALBUMIN 2.7*    Cardiac Enzymes  Recent Labs Lab 01/15/17 2338 01/16/17 0545 01/16/17 1224  TROPONINI 0.38* 0.30* 0.23*    Glucose  Recent Labs Lab 01/16/17 0115 01/16/17 0336 01/16/17 0753 01/16/17 1125 01/16/17 1538  GLUCAP 127* 142* 115* 112* 133*    Imaging No results found.  STUDIES:  CTA-Pulmonary -> PE  CULTURES: None  ANTIBIOTICS: None  SIGNIFICANT EVENTS: Code Blue Called 3/10 -- Syncope & VT  LINES/TUBES: PIVs  DISCUSSION: 82 y/o woman with saddle PE and clinical instability  ASSESSMENT / PLAN:  PULMONARY A: Massive Saddle PE P:   Discussed with cards -- will consult IR for EKOS placement. Believe that with this degree of clinical instability, benefits of lysis out weigh risks. Hx of Cerebral hemorrhage is remote, and was traumatic, not spontaneous.  CARDIOVASCULAR A:  VT R Heart failure AF with RVR Diastolic HF P:  Current situation is mostly driven by hemodynamic consequences of massive PE. Will re-evaluate following clot lysis.  RENAL A:   AKI P:   LIkely driven by shock from PE Will addres underlying cause  GASTROINTESTINAL A:    No active issues P:    HEMATOLOGIC A:   Thrombocytopenia Acute Thromboembolism P:  Unclear what may be driving this; normal Hb, so not likely MAHA. Will trend. Was low on presentation, so not c/w HIT.  INFECTIOUS A:   No active issues P:    ENDOCRINE A:   Obesity   P:    NEUROLOGIC A:   No active issues P:   RASS goal: 0   FAMILY  - Updates:   - Inter-disciplinary family meet or Palliative Care meeting due by:  01/25/17  CRITICAL CARE Performed by: Luz Brazen   Total critical care time: 45 minutes  Critical care time was exclusive of separately billable procedures and treating other patients.  Critical care was necessary to treat or prevent imminent or life-threatening deterioration.  Critical care was time spent personally by me on the following activities: development of treatment plan with patient and/or surrogate as well as nursing, discussions with consultants, evaluation of patient's response to treatment, examination of patient, obtaining history from patient or surrogate, ordering and performing treatments and interventions, ordering and review of laboratory studies, ordering and review of radiographic studies, pulse oximetry and re-evaluation of patient's condition.    Pulmonary and [place] Pager: [phone]  01/19/2017, 1:01 AM

## 2017-01-19 NOTE — Progress Notes (Signed)
1145am.  Post tPa 12 hour run for pulmonary emboli shows a pressure of 41/15 (25).  Pressures reported to Dr. Arne Cleveland.  Catheters and sheaths removed from neck using manual pressure.  Tegaderm applied.

## 2017-01-19 NOTE — Progress Notes (Signed)
DAILY PROGRESS NOTE  Subjective:  Converted to sinus brady around 8 pm last night after a long-pause. Occasional PVC's, but no significant NSVT reported. On amiodarone. Bilateral hemodynamically significant PE's with severe RV hypokinesis and dilatation. She is on the EKOS system. No c/o chest pain or dyspnea today, lying flat.  Objective:  Temp:  [97.3 F (36.3 C)-97.9 F (36.6 C)] 97.4 F (36.3 C) (03/11 0803) Pulse Rate:  [55-132] 60 (03/11 1000) Resp:  [13-38] 16 (03/11 1000) BP: (96-143)/(44-113) 123/64 (03/11 1000) SpO2:  [92 %-100 %] 96 % (03/11 1000) FiO2 (%):  [100 %] 100 % (03/10 2031) Weight:  [249 lb 12.5 oz (113.3 kg)] 249 lb 12.5 oz (113.3 kg) (03/10 1815) Weight change: 6 lb 2.9 oz (2.804 kg)  Intake/Output from previous day: 03/10 0701 - 03/11 0700 In: 810.2 [I.V.:810.2] Out: 270 [Urine:270]  Intake/Output from this shift: Total I/O In: 238.5 [I.V.:238.5] Out: 60 [Urine:60]  Medications: No current facility-administered medications on file prior to encounter.    No current outpatient prescriptions on file prior to encounter.    Physical Exam: General appearance: alert, no distress and morbidly obese Lungs: diminished breath sounds bilaterally Heart: regular rate and rhythm Extremities: edema 1+ edema Neurologic: Grossly normal  Lab Results: Results for orders placed or performed during the hospital encounter of 01/15/17 (from the past 48 hour(s))  Heparin level (unfractionated)     Status: None   Collection Time: 01/17/17 12:22 PM  Result Value Ref Range   Heparin Unfractionated 0.57 0.30 - 0.70 IU/mL    Comment:        IF HEPARIN RESULTS ARE BELOW EXPECTED VALUES, AND PATIENT DOSAGE HAS BEEN CONFIRMED, SUGGEST FOLLOW UP TESTING OF ANTITHROMBIN III LEVELS.   CBC     Status: Abnormal   Collection Time: 01/18/17  3:56 AM  Result Value Ref Range   WBC 10.0 4.0 - 10.5 K/uL   RBC 4.28 3.87 - 5.11 MIL/uL   Hemoglobin 12.6 12.0 - 15.0 g/dL   HCT 40.3 36.0 - 46.0 %   MCV 94.2 78.0 - 100.0 fL   MCH 29.4 26.0 - 34.0 pg   MCHC 31.3 30.0 - 36.0 g/dL   RDW 15.3 11.5 - 15.5 %   Platelets 96 (L) 150 - 400 K/uL    Comment: CONSISTENT WITH PREVIOUS RESULT  Heparin level (unfractionated)     Status: None   Collection Time: 01/18/17  3:56 AM  Result Value Ref Range   Heparin Unfractionated 0.61 0.30 - 0.70 IU/mL    Comment:        IF HEPARIN RESULTS ARE BELOW EXPECTED VALUES, AND PATIENT DOSAGE HAS BEEN CONFIRMED, SUGGEST FOLLOW UP TESTING OF ANTITHROMBIN III LEVELS.   Basic metabolic panel     Status: Abnormal   Collection Time: 01/18/17  3:56 AM  Result Value Ref Range   Sodium 135 135 - 145 mmol/L   Potassium 4.4 3.5 - 5.1 mmol/L   Chloride 102 101 - 111 mmol/L   CO2 23 22 - 32 mmol/L   Glucose, Bld 163 (H) 65 - 99 mg/dL   BUN 26 (H) 6 - 20 mg/dL   Creatinine, Ser 1.28 (H) 0.44 - 1.00 mg/dL   Calcium 8.9 8.9 - 10.3 mg/dL   GFR calc non Af Amer 38 (L) >60 mL/min   GFR calc Af Amer 45 (L) >60 mL/min    Comment: (NOTE) The eGFR has been calculated using the CKD EPI equation. This calculation has not been validated in  all clinical situations. eGFR's persistently <60 mL/min signify possible Chronic Kidney Disease.    Anion gap 10 5 - 15  Magnesium     Status: None   Collection Time: 01/18/17  9:01 PM  Result Value Ref Range   Magnesium 1.7 1.7 - 2.4 mg/dL  Heparin level (unfractionated) every 6 hours x 4 post-procedure     Status: None   Collection Time: 01/18/17 10:33 PM  Result Value Ref Range   Heparin Unfractionated 0.43 0.30 - 0.70 IU/mL    Comment:        IF HEPARIN RESULTS ARE BELOW EXPECTED VALUES, AND PATIENT DOSAGE HAS BEEN CONFIRMED, SUGGEST FOLLOW UP TESTING OF ANTITHROMBIN III LEVELS.   CBC every 6 hours x 4 post-procedure     Status: Abnormal   Collection Time: 01/18/17 10:33 PM  Result Value Ref Range   WBC 10.1 4.0 - 10.5 K/uL   RBC 4.07 3.87 - 5.11 MIL/uL   Hemoglobin 12.4 12.0 - 15.0 g/dL    HCT 38.2 36.0 - 46.0 %   MCV 93.9 78.0 - 100.0 fL   MCH 30.5 26.0 - 34.0 pg   MCHC 32.5 30.0 - 36.0 g/dL   RDW 15.5 11.5 - 15.5 %   Platelets 90 (L) 150 - 400 K/uL    Comment: CONSISTENT WITH PREVIOUS RESULT  Fibrinogen every 6 hours x 4 post-procedure     Status: Abnormal   Collection Time: 01/18/17 10:33 PM  Result Value Ref Range   Fibrinogen 516 (H) 210 - 475 mg/dL  CBC     Status: Abnormal   Collection Time: 01/19/17  5:49 AM  Result Value Ref Range   WBC 8.4 4.0 - 10.5 K/uL   RBC 4.05 3.87 - 5.11 MIL/uL   Hemoglobin 11.9 (L) 12.0 - 15.0 g/dL   HCT 37.9 36.0 - 46.0 %   MCV 93.6 78.0 - 100.0 fL   MCH 29.4 26.0 - 34.0 pg   MCHC 31.4 30.0 - 36.0 g/dL   RDW 15.7 (H) 11.5 - 15.5 %   Platelets 75 (L) 150 - 400 K/uL    Comment: CONSISTENT WITH PREVIOUS RESULT  Fibrinogen every 6 hours x 4 post-procedure     Status: Abnormal   Collection Time: 01/19/17  5:49 AM  Result Value Ref Range   Fibrinogen 603 (H) 210 - 475 mg/dL  Heparin level (unfractionated) every 6 hours x 4 post-procedure     Status: None   Collection Time: 01/19/17  5:50 AM  Result Value Ref Range   Heparin Unfractionated 0.33 0.30 - 0.70 IU/mL    Comment:        IF HEPARIN RESULTS ARE BELOW EXPECTED VALUES, AND PATIENT DOSAGE HAS BEEN CONFIRMED, SUGGEST FOLLOW UP TESTING OF ANTITHROMBIN III LEVELS.     Imaging: Dg Chest Port 1 View  Result Date: 01/19/2017 CLINICAL DATA:  82 year old female with acute saddle embolus. Postoperative day 1 status post catheter directed ultrasound-assisted bilateral pulmonary arterial thrombolysis initiated via R IJ x2. Hemoptysis EXAM: PORTABLE CHEST 1 VIEW COMPARISON:  01/15/2017 portable chest and earlier. FINDINGS: Portable AP semi upright view at 0947 hours. Right IJ approach pulmonary artery infusion catheters are in place. Left chest pacer or resuscitation pads are in place. Stable lung volumes. Stable cardiac size and mediastinal contours. Allowing for portable technique  the lungs are clear. IMPRESSION: Right IJ approach pulmonary artery infusion catheters in place, otherwise stable and negative portable radiographic appearance of the chest. Electronically Signed   By: Genevie Ann  M.D.   On: 01/19/2017 10:07    Assessment:  1. Principal Problem: 2.   Acute pulmonary embolism ([place]) 3. Active Problems: 4.   CHF (congestive heart failure) ([place]) 5.   Hyperglycemia 6.   Elevated troponin 7.   Atrial fibrillation with RVR ([place]) 8.   Wide-complex tachycardia ([place]) 9.   Severe right ventricular systolic dysfunction ([facility]) 10.   Saddle embolus of pulmonary artery with acute cor pulmonale ([facility]) 11.   Plan:  1. A-fib converted last night- no evidence for recurrent NSVT, except sporadic PVC's. On IV amiodarone - would continue this. She is on the EKOS system for hemodynamically significant PE's. Will likely need diuresis for acute congestive right heart failure. Consider lasix today if hemodynamically stable.  Time Spent Directly with Patient:  15 minutes  Length of Stay:  LOS: 4 days   Pixie Casino, MD, [facility] Attending Cardiologist [place] 01/19/2017, 11:21 AM

## 2017-01-19 NOTE — Progress Notes (Signed)
ANTICOAGULATION CONSULT NOTE - Follow Up Consult  Pharmacy Consult for Heparin Indication: pulmonary embolus  Allergies  Allergen Reactions  . Aspirin Other (See Comments)    GI bleed  . Nsaids Other (See Comments)    GI bleed  . Penicillins Shortness Of Breath and Rash  . Simvastatin Nausea Only  . Vancomycin Rash    Patient Measurements: Height: 5\' 5"  (165.1 cm) Weight: 249 lb 12.5 oz (113.3 kg) IBW/kg (Calculated) : 57 Heparin Dosing Weight: 79 Kg  Vital Signs: Temp: 98.6 F (37 C) (03/11 1604) Temp Source: Oral (03/11 1604) BP: 117/66 (03/11 1600) Pulse Rate: 59 (03/11 1600)  Labs:  Recent Labs  01/17/17 0529  01/18/17 0356 01/18/17 2233 01/19/17 0549 01/19/17 0550 01/19/17 1245 01/19/17 1600  HGB 13.1  --  12.6 12.4 11.9*  --  11.5*  --   HCT 41.0  --  40.3 38.2 37.9  --  36.3  --   PLT 103*  --  96* 90* 75*  --  77*  --   HEPARINUNFRC 0.72*  < > 0.61 0.43  --  0.33  --  0.43  CREATININE 1.13*  --  1.28*  --   --   --   --   --   < > = values in this interval not displayed.  Estimated Creatinine Clearance: 44 mL/min (by C-G formula based on SCr of 1.28 mg/dL (H)).  Assessment: 82 year old female on heparin for extensive bilateral pulmonary embolism with R heart strain. Cath directed USAT (alteplase) initiated this p.m. in IR. Heparin level 0.43 (therapeutic) on heparin 950 units/hr. Hgb remains stable, plt low but stable. No bleeding noted. Will confirm level in 6 hours. Nurse reports no further bleeding and no issues with infusion.  Goal of Therapy:  Heparin level 0.3-0.7 units/ml Monitor platelets by anticoagulation protocol: Yes  Plan: Continue heparin 950 units/hr 6h confirmatory HL Daily HL/CBC  Andrey Cota. Diona Foley, PharmD, BCPS Clinical Pharmacist [phone] 01/19/2017 5:13 PM

## 2017-01-19 NOTE — Progress Notes (Signed)
ANTICOAGULATION CONSULT NOTE - Follow Up Consult  Pharmacy Consult for Heparin Indication: pulmonary embolus  Allergies  Allergen Reactions  . Aspirin Other (See Comments)    GI bleed  . Nsaids Other (See Comments)    GI bleed  . Penicillins Shortness Of Breath and Rash  . Simvastatin Nausea Only  . Vancomycin Rash    Patient Measurements: Height: 5\' 5"  (165.1 cm) Weight: 249 lb 12.5 oz (113.3 kg) IBW/kg (Calculated) : 57 Heparin Dosing Weight: 79 Kg  Vital Signs: Temp: 98.1 F (36.7 C) (03/11 2037) Temp Source: Oral (03/11 2037) BP: 108/56 (03/11 1900) Pulse Rate: 62 (03/11 1900)  Labs:  Recent Labs  01/17/17 0529  01/18/17 0356 01/18/17 2233 01/19/17 0549 01/19/17 0550 01/19/17 1245 01/19/17 1600 01/19/17 2200  HGB 13.1  --  12.6 12.4 11.9*  --  11.5*  --   --   HCT 41.0  --  40.3 38.2 37.9  --  36.3  --   --   PLT 103*  --  96* 90* 75*  --  77*  --   --   HEPARINUNFRC 0.72*  < > 0.61 0.43  --  0.33  --  0.43 0.36  CREATININE 1.13*  --  1.28*  --   --   --   --   --   --   < > = values in this interval not displayed.  Estimated Creatinine Clearance: 44 mL/min (by C-G formula based on SCr of 1.28 mg/dL (H)).  Assessment: 82 year old female on heparin for extensive bilateral pulmonary embolism with R heart strain. S/p cath directed USAT (alteplase) 3/10-3/11. Heparin level therapeutic (0.36) on 950 units/hr. Would like heparin level in upper end of range with large clot burden. Pt with a little bit of blood tinged sputum so will follow closely.  Goal of Therapy:  Heparin level 0.3-0.7 units/ml Monitor platelets by anticoagulation protocol: Yes  Plan: Increase heparin to 1050 units/hr Daily HL/CBC  Sherlon Handing, PharmD, BCPS Clinical pharmacist, pager [phone] 01/19/2017 10:55 PM

## 2017-01-19 NOTE — Progress Notes (Signed)
PULMONARY / CRITICAL CARE MEDICINE   Name: Jennifer Medina MRN: [id number] DOB: [birth year]    ADMISSION DATE:  01/15/2017  CHIEF COMPLAINT:  Syncope  BRIEF SUMMARY:  Ms. Vonruden is a 82 y/o woman admitted with saddle PE, syncope, who developed hypotension and on 3/10 developed another syncopal episode with VT seen on the monitor. She was brought back to the ICU, EKOS protocol initiated.  VITAL SIGNS: BP 132/75 (BP Location: Right Arm)   Pulse (!) 59   Temp 97.5 F (36.4 C) (Oral)   Resp 19   Ht 5\' 5"  (1.651 m)   Wt 249 lb 12.5 oz (113.3 kg)   SpO2 93%   BMI 41.57 kg/m   HEMODYNAMICS:    VENTILATOR SETTINGS: FiO2 (%):  [100 %] 100 %  INTAKE / OUTPUT: I/O last 3 completed shifts: In: 1006.7 [P.O.:120; I.V.:886.7] Out: 370 [Urine:370]  PHYSICAL EXAMINATION: General: elderly female in NAD HEENT: MM pink/moist PSY: calm/ appropriate Neuro: AAOx4, speech clear, MAE CV: s1s2 rrr, no m/r/g PULM: even/non-labored, lungs bilaterally diminished but clear NW:GNFA, non-tender, bsx4 active  Extremities: warm/dry, no edema  Skin: no rashes or lesions   LABS:  BMET  Recent Labs Lab 01/16/17 0545 01/17/17 0529 01/18/17 0356  NA 139 138 135  K 4.0 3.9 4.4  CL 109 107 102  CO2 19* 20* 23  BUN 28* 23* 26*  CREATININE 1.35* 1.13* 1.28*  GLUCOSE 139* 120* 163*    Electrolytes  Recent Labs Lab 01/16/17 0545 01/17/17 0529 01/18/17 0356 01/18/17 2101  CALCIUM 7.8* 8.6* 8.9  --   MG  --   --   --  1.7    CBC  Recent Labs Lab 01/18/17 0356 01/18/17 2233 01/19/17 0549  WBC 10.0 10.1 8.4  HGB 12.6 12.4 11.9*  HCT 40.3 38.2 37.9  PLT 96* 90* 75*    Coag's  Recent Labs Lab 01/16/17 0003  INR 1.20    Sepsis Markers  Recent Labs Lab 01/15/17 2338 01/16/17 0545  LATICACIDVEN 2.6* 2.0*    ABG No results for input(s): PHART, PCO2ART, PO2ART in the last 168 hours.  Liver Enzymes  Recent Labs Lab 01/15/17 2338  AST 94*  ALT 58*  ALKPHOS  138*  BILITOT 1.0  ALBUMIN 2.7*    Cardiac Enzymes  Recent Labs Lab 01/15/17 2338 01/16/17 0545 01/16/17 1224  TROPONINI 0.38* 0.30* 0.23*    Glucose  Recent Labs Lab 01/16/17 0115 01/16/17 0336 01/16/17 0753 01/16/17 1125 01/16/17 1538  GLUCAP 127* 142* 115* 112* 133*    Imaging Ir Angiogram Pulmonary Bilateral Selective  Result Date: 01/19/2017 INDICATION: Acute bilateral pulmonary emboli. Recent new cardiac arrhythmia and progressive right heart strain. EXAM: 1. ULTRASOUND GUIDANCE FOR VENOUS ACCESS X2 2. DIRECT PULMONARY ARTERY PRESSURE MEASUREMENTS 3. FLUOROSCOPIC GUIDED PLACEMENT OF BILATERAL PULMONARY ARTERIAL LYTIC INFUSION CATHETERS COMPARISON:  Chest CTA - 01/15/2017 MEDICATIONS: Intravenous Fentanyl and Versed were administered as conscious sedation during continuous monitoring of the patient's level of consciousness and physiological / cardiorespiratory status by the radiology RN, with a total moderate sedation time of 48minutes. CONTRAST:  None used FLUOROSCOPY TIME:  6.9 min  (82  uGym2 DAP) COMPLICATIONS: None immediate TECHNIQUE: Informed written consent was obtained from the patient after a discussion of the risks, benefits and alternatives to treatment. Questions regarding the procedure were encouraged and answered. A timeout was performed prior to the initiation of the procedure. Previous ultrasound had demonstrated right lower extremity DVT. Ultrasound scanning was performed of the right IJ vein  demonstrating widely patent. As such, the right internal jugular vein was selected for vascular access. The right neck was prepped and draped in the usual sterile fashion, and a sterile drape was applied covering the operative field. Maximum barrier sterile technique with sterile gowns and gloves were used for the procedure. A timeout was performed prior to the initiation of the procedure. Local anesthesia was provided with 1% lidocaine. Under direct ultrasound guidance, the  right internal jugular vein was accessed with a micro puncture sheath ultimately allowing placement of a 6 French vascular sheath. With the use of a glidewire, an angled pigtail catheter was advanced into the right main pulmonary artery and Pressure measurements were then obtained from the right pulmonary artery. Over a wire, the pigtail catheter was exchanged for a 105/18 cm multi side-hole EKOS ultrasound assisted infusion catheter. Slightly cranial to this initial access, the right internal jugular vein was again accessed with a micropuncture sheath ultimately allowing placement of a 6 French vascular sheath. With the use of a glidewire, a pigtail catheter was advanced into the left main pulmonary artery . Over an exchange length wire, the pigtail catheter was exchanged for a 105/12 cm multi side-hole EKOS ultrasound assisted infusion catheter. A postprocedural fluoroscopic image was obtained of the check demonstrating final catheter positioning. Both vascular sheath were secured at the right neck with interrupted 0 Prolene suture. The external catheter tubing was secured at the right chest and the lytic therapy was initiated. The patient tolerated the procedure well without immediate postprocedural complication. FINDINGS: Directly recorded proximal right pulmonary artery pressure measurements: 46/10 (23) mmHg (normal: < 25/10) Following the procedure, both ultrasound assisted infusion catheter tips terminate within the distal aspects of the bilateral lower lobe sub segmental pulmonary arteries. IMPRESSION: 1. Successful fluoroscopic guided initiation of bilateral ultrasound assisted catheter directed pulmonary arterial lysis for sub massive pulmonary embolism and right-sided heart strain. 2. Markedly elevated pressure measurements within the right main pulmonary artery compatible with critical pulmonary arterial hypertension. PLAN: - The patient will be assessed approximately 12 hours following the initiation of  the catheter directed pulmonary arterial lysis for repeat pressure measurements and either removal of the catheters or continuation of the catheter directed thrombolysis. Electronically Signed   By: Lucrezia Europe M.D.   On: 01/19/2017 12:17   Ir Angiogram Selective Each Additional Vessel  Result Date: 01/19/2017 INDICATION: Acute bilateral pulmonary emboli. Recent new cardiac arrhythmia and progressive right heart strain. EXAM: 1. ULTRASOUND GUIDANCE FOR VENOUS ACCESS X2 2. DIRECT PULMONARY ARTERY PRESSURE MEASUREMENTS 3. FLUOROSCOPIC GUIDED PLACEMENT OF BILATERAL PULMONARY ARTERIAL LYTIC INFUSION CATHETERS COMPARISON:  Chest CTA - 01/15/2017 MEDICATIONS: Intravenous Fentanyl and Versed were administered as conscious sedation during continuous monitoring of the patient's level of consciousness and physiological / cardiorespiratory status by the radiology RN, with a total moderate sedation time of 3minutes. CONTRAST:  None used FLUOROSCOPY TIME:  6.9 min  (82  uGym2 DAP) COMPLICATIONS: None immediate TECHNIQUE: Informed written consent was obtained from the patient after a discussion of the risks, benefits and alternatives to treatment. Questions regarding the procedure were encouraged and answered. A timeout was performed prior to the initiation of the procedure. Previous ultrasound had demonstrated right lower extremity DVT. Ultrasound scanning was performed of the right IJ vein demonstrating widely patent. As such, the right internal jugular vein was selected for vascular access. The right neck was prepped and draped in the usual sterile fashion, and a sterile drape was applied covering the operative field. Maximum barrier sterile technique  with sterile gowns and gloves were used for the procedure. A timeout was performed prior to the initiation of the procedure. Local anesthesia was provided with 1% lidocaine. Under direct ultrasound guidance, the right internal jugular vein was accessed with a micro puncture  sheath ultimately allowing placement of a 6 French vascular sheath. With the use of a glidewire, an angled pigtail catheter was advanced into the right main pulmonary artery and Pressure measurements were then obtained from the right pulmonary artery. Over a wire, the pigtail catheter was exchanged for a 105/18 cm multi side-hole EKOS ultrasound assisted infusion catheter. Slightly cranial to this initial access, the right internal jugular vein was again accessed with a micropuncture sheath ultimately allowing placement of a 6 French vascular sheath. With the use of a glidewire, a pigtail catheter was advanced into the left main pulmonary artery . Over an exchange length wire, the pigtail catheter was exchanged for a 105/12 cm multi side-hole EKOS ultrasound assisted infusion catheter. A postprocedural fluoroscopic image was obtained of the check demonstrating final catheter positioning. Both vascular sheath were secured at the right neck with interrupted 0 Prolene suture. The external catheter tubing was secured at the right chest and the lytic therapy was initiated. The patient tolerated the procedure well without immediate postprocedural complication. FINDINGS: Directly recorded proximal right pulmonary artery pressure measurements: 46/10 (23) mmHg (normal: < 25/10) Following the procedure, both ultrasound assisted infusion catheter tips terminate within the distal aspects of the bilateral lower lobe sub segmental pulmonary arteries. IMPRESSION: 1. Successful fluoroscopic guided initiation of bilateral ultrasound assisted catheter directed pulmonary arterial lysis for sub massive pulmonary embolism and right-sided heart strain. 2. Markedly elevated pressure measurements within the right main pulmonary artery compatible with critical pulmonary arterial hypertension. PLAN: - The patient will be assessed approximately 12 hours following the initiation of the catheter directed pulmonary arterial lysis for repeat  pressure measurements and either removal of the catheters or continuation of the catheter directed thrombolysis. Electronically Signed   By: Lucrezia Europe M.D.   On: 01/19/2017 12:17   Ir Angiogram Selective Each Additional Vessel  Result Date: 01/19/2017 INDICATION: Acute bilateral pulmonary emboli. Recent new cardiac arrhythmia and progressive right heart strain. EXAM: 1. ULTRASOUND GUIDANCE FOR VENOUS ACCESS X2 2. DIRECT PULMONARY ARTERY PRESSURE MEASUREMENTS 3. FLUOROSCOPIC GUIDED PLACEMENT OF BILATERAL PULMONARY ARTERIAL LYTIC INFUSION CATHETERS COMPARISON:  Chest CTA - 01/15/2017 MEDICATIONS: Intravenous Fentanyl and Versed were administered as conscious sedation during continuous monitoring of the patient's level of consciousness and physiological / cardiorespiratory status by the radiology RN, with a total moderate sedation time of 59minutes. CONTRAST:  None used FLUOROSCOPY TIME:  6.9 min  (82  uGym2 DAP) COMPLICATIONS: None immediate TECHNIQUE: Informed written consent was obtained from the patient after a discussion of the risks, benefits and alternatives to treatment. Questions regarding the procedure were encouraged and answered. A timeout was performed prior to the initiation of the procedure. Previous ultrasound had demonstrated right lower extremity DVT. Ultrasound scanning was performed of the right IJ vein demonstrating widely patent. As such, the right internal jugular vein was selected for vascular access. The right neck was prepped and draped in the usual sterile fashion, and a sterile drape was applied covering the operative field. Maximum barrier sterile technique with sterile gowns and gloves were used for the procedure. A timeout was performed prior to the initiation of the procedure. Local anesthesia was provided with 1% lidocaine. Under direct ultrasound guidance, the right internal jugular vein was accessed with a  micro puncture sheath ultimately allowing placement of a 6 French vascular  sheath. With the use of a glidewire, an angled pigtail catheter was advanced into the right main pulmonary artery and Pressure measurements were then obtained from the right pulmonary artery. Over a wire, the pigtail catheter was exchanged for a 105/18 cm multi side-hole EKOS ultrasound assisted infusion catheter. Slightly cranial to this initial access, the right internal jugular vein was again accessed with a micropuncture sheath ultimately allowing placement of a 6 French vascular sheath. With the use of a glidewire, a pigtail catheter was advanced into the left main pulmonary artery . Over an exchange length wire, the pigtail catheter was exchanged for a 105/12 cm multi side-hole EKOS ultrasound assisted infusion catheter. A postprocedural fluoroscopic image was obtained of the check demonstrating final catheter positioning. Both vascular sheath were secured at the right neck with interrupted 0 Prolene suture. The external catheter tubing was secured at the right chest and the lytic therapy was initiated. The patient tolerated the procedure well without immediate postprocedural complication. FINDINGS: Directly recorded proximal right pulmonary artery pressure measurements: 46/10 (23) mmHg (normal: < 25/10) Following the procedure, both ultrasound assisted infusion catheter tips terminate within the distal aspects of the bilateral lower lobe sub segmental pulmonary arteries. IMPRESSION: 1. Successful fluoroscopic guided initiation of bilateral ultrasound assisted catheter directed pulmonary arterial lysis for sub massive pulmonary embolism and right-sided heart strain. 2. Markedly elevated pressure measurements within the right main pulmonary artery compatible with critical pulmonary arterial hypertension. PLAN: - The patient will be assessed approximately 12 hours following the initiation of the catheter directed pulmonary arterial lysis for repeat pressure measurements and either removal of the catheters or  continuation of the catheter directed thrombolysis. Electronically Signed   By: Lucrezia Europe M.D.   On: 01/19/2017 12:17   Ir US Guide Vasc Access Right  Result Date: 01/19/2017 INDICATION: Acute bilateral pulmonary emboli. Recent new cardiac arrhythmia and progressive right heart strain. EXAM: 1. ULTRASOUND GUIDANCE FOR VENOUS ACCESS X2 2. DIRECT PULMONARY ARTERY PRESSURE MEASUREMENTS 3. FLUOROSCOPIC GUIDED PLACEMENT OF BILATERAL PULMONARY ARTERIAL LYTIC INFUSION CATHETERS COMPARISON:  Chest CTA - 01/15/2017 MEDICATIONS: Intravenous Fentanyl and Versed were administered as conscious sedation during continuous monitoring of the patient's level of consciousness and physiological / cardiorespiratory status by the radiology RN, with a total moderate sedation time of 58minutes. CONTRAST:  None used FLUOROSCOPY TIME:  6.9 min  (82  uGym2 DAP) COMPLICATIONS: None immediate TECHNIQUE: Informed written consent was obtained from the patient after a discussion of the risks, benefits and alternatives to treatment. Questions regarding the procedure were encouraged and answered. A timeout was performed prior to the initiation of the procedure. Previous ultrasound had demonstrated right lower extremity DVT. Ultrasound scanning was performed of the right IJ vein demonstrating widely patent. As such, the right internal jugular vein was selected for vascular access. The right neck was prepped and draped in the usual sterile fashion, and a sterile drape was applied covering the operative field. Maximum barrier sterile technique with sterile gowns and gloves were used for the procedure. A timeout was performed prior to the initiation of the procedure. Local anesthesia was provided with 1% lidocaine. Under direct ultrasound guidance, the right internal jugular vein was accessed with a micro puncture sheath ultimately allowing placement of a 6 French vascular sheath. With the use of a glidewire, an angled pigtail catheter was advanced  into the right main pulmonary artery and Pressure measurements were then obtained from the right pulmonary  artery. Over a wire, the pigtail catheter was exchanged for a 105/18 cm multi side-hole EKOS ultrasound assisted infusion catheter. Slightly cranial to this initial access, the right internal jugular vein was again accessed with a micropuncture sheath ultimately allowing placement of a 6 French vascular sheath. With the use of a glidewire, a pigtail catheter was advanced into the left main pulmonary artery . Over an exchange length wire, the pigtail catheter was exchanged for a 105/12 cm multi side-hole EKOS ultrasound assisted infusion catheter. A postprocedural fluoroscopic image was obtained of the check demonstrating final catheter positioning. Both vascular sheath were secured at the right neck with interrupted 0 Prolene suture. The external catheter tubing was secured at the right chest and the lytic therapy was initiated. The patient tolerated the procedure well without immediate postprocedural complication. FINDINGS: Directly recorded proximal right pulmonary artery pressure measurements: 46/10 (23) mmHg (normal: < 25/10) Following the procedure, both ultrasound assisted infusion catheter tips terminate within the distal aspects of the bilateral lower lobe sub segmental pulmonary arteries. IMPRESSION: 1. Successful fluoroscopic guided initiation of bilateral ultrasound assisted catheter directed pulmonary arterial lysis for sub massive pulmonary embolism and right-sided heart strain. 2. Markedly elevated pressure measurements within the right main pulmonary artery compatible with critical pulmonary arterial hypertension. PLAN: - The patient will be assessed approximately 12 hours following the initiation of the catheter directed pulmonary arterial lysis for repeat pressure measurements and either removal of the catheters or continuation of the catheter directed thrombolysis. Electronically Signed   By:  Lucrezia Europe M.D.   On: 01/19/2017 12:17   Dg Chest Port 1 View  Result Date: 01/19/2017 CLINICAL DATA:  82 year old female with acute saddle embolus. Postoperative day 1 status post catheter directed ultrasound-assisted bilateral pulmonary arterial thrombolysis initiated via R IJ x2. Hemoptysis EXAM: PORTABLE CHEST 1 VIEW COMPARISON:  01/15/2017 portable chest and earlier. FINDINGS: Portable AP semi upright view at 0947 hours. Right IJ approach pulmonary artery infusion catheters are in place. Left chest pacer or resuscitation pads are in place. Stable lung volumes. Stable cardiac size and mediastinal contours. Allowing for portable technique the lungs are clear. IMPRESSION: Right IJ approach pulmonary artery infusion catheters in place, otherwise stable and negative portable radiographic appearance of the chest. Electronically Signed   By: Genevie Ann M.D.   On: 01/19/2017 10:07   Ir Infusion Thrombol Arterial Initial (ms)  Result Date: 01/19/2017 INDICATION: Acute bilateral pulmonary emboli. Recent new cardiac arrhythmia and progressive right heart strain. EXAM: 1. ULTRASOUND GUIDANCE FOR VENOUS ACCESS X2 2. DIRECT PULMONARY ARTERY PRESSURE MEASUREMENTS 3. FLUOROSCOPIC GUIDED PLACEMENT OF BILATERAL PULMONARY ARTERIAL LYTIC INFUSION CATHETERS COMPARISON:  Chest CTA - 01/15/2017 MEDICATIONS: Intravenous Fentanyl and Versed were administered as conscious sedation during continuous monitoring of the patient's level of consciousness and physiological / cardiorespiratory status by the radiology RN, with a total moderate sedation time of 61minutes. CONTRAST:  None used FLUOROSCOPY TIME:  6.9 min  (82  uGym2 DAP) COMPLICATIONS: None immediate TECHNIQUE: Informed written consent was obtained from the patient after a discussion of the risks, benefits and alternatives to treatment. Questions regarding the procedure were encouraged and answered. A timeout was performed prior to the initiation of the procedure. Previous  ultrasound had demonstrated right lower extremity DVT. Ultrasound scanning was performed of the right IJ vein demonstrating widely patent. As such, the right internal jugular vein was selected for vascular access. The right neck was prepped and draped in the usual sterile fashion, and a sterile drape was applied covering  the operative field. Maximum barrier sterile technique with sterile gowns and gloves were used for the procedure. A timeout was performed prior to the initiation of the procedure. Local anesthesia was provided with 1% lidocaine. Under direct ultrasound guidance, the right internal jugular vein was accessed with a micro puncture sheath ultimately allowing placement of a 6 French vascular sheath. With the use of a glidewire, an angled pigtail catheter was advanced into the right main pulmonary artery and Pressure measurements were then obtained from the right pulmonary artery. Over a wire, the pigtail catheter was exchanged for a 105/18 cm multi side-hole EKOS ultrasound assisted infusion catheter. Slightly cranial to this initial access, the right internal jugular vein was again accessed with a micropuncture sheath ultimately allowing placement of a 6 French vascular sheath. With the use of a glidewire, a pigtail catheter was advanced into the left main pulmonary artery . Over an exchange length wire, the pigtail catheter was exchanged for a 105/12 cm multi side-hole EKOS ultrasound assisted infusion catheter. A postprocedural fluoroscopic image was obtained of the check demonstrating final catheter positioning. Both vascular sheath were secured at the right neck with interrupted 0 Prolene suture. The external catheter tubing was secured at the right chest and the lytic therapy was initiated. The patient tolerated the procedure well without immediate postprocedural complication. FINDINGS: Directly recorded proximal right pulmonary artery pressure measurements: 46/10 (23) mmHg (normal: < 25/10)  Following the procedure, both ultrasound assisted infusion catheter tips terminate within the distal aspects of the bilateral lower lobe sub segmental pulmonary arteries. IMPRESSION: 1. Successful fluoroscopic guided initiation of bilateral ultrasound assisted catheter directed pulmonary arterial lysis for sub massive pulmonary embolism and right-sided heart strain. 2. Markedly elevated pressure measurements within the right main pulmonary artery compatible with critical pulmonary arterial hypertension. PLAN: - The patient will be assessed approximately 12 hours following the initiation of the catheter directed pulmonary arterial lysis for repeat pressure measurements and either removal of the catheters or continuation of the catheter directed thrombolysis. Electronically Signed   By: Lucrezia Europe M.D.   On: 01/19/2017 12:17   Ir Infusion Thrombol Arterial Initial (ms)  Result Date: 01/19/2017 INDICATION: Acute bilateral pulmonary emboli. Recent new cardiac arrhythmia and progressive right heart strain. EXAM: 1. ULTRASOUND GUIDANCE FOR VENOUS ACCESS X2 2. DIRECT PULMONARY ARTERY PRESSURE MEASUREMENTS 3. FLUOROSCOPIC GUIDED PLACEMENT OF BILATERAL PULMONARY ARTERIAL LYTIC INFUSION CATHETERS COMPARISON:  Chest CTA - 01/15/2017 MEDICATIONS: Intravenous Fentanyl and Versed were administered as conscious sedation during continuous monitoring of the patient's level of consciousness and physiological / cardiorespiratory status by the radiology RN, with a total moderate sedation time of 104minutes. CONTRAST:  None used FLUOROSCOPY TIME:  6.9 min  (82  uGym2 DAP) COMPLICATIONS: None immediate TECHNIQUE: Informed written consent was obtained from the patient after a discussion of the risks, benefits and alternatives to treatment. Questions regarding the procedure were encouraged and answered. A timeout was performed prior to the initiation of the procedure. Previous ultrasound had demonstrated right lower extremity DVT.  Ultrasound scanning was performed of the right IJ vein demonstrating widely patent. As such, the right internal jugular vein was selected for vascular access. The right neck was prepped and draped in the usual sterile fashion, and a sterile drape was applied covering the operative field. Maximum barrier sterile technique with sterile gowns and gloves were used for the procedure. A timeout was performed prior to the initiation of the procedure. Local anesthesia was provided with 1% lidocaine. Under direct ultrasound guidance, the right  internal jugular vein was accessed with a micro puncture sheath ultimately allowing placement of a 6 French vascular sheath. With the use of a glidewire, an angled pigtail catheter was advanced into the right main pulmonary artery and Pressure measurements were then obtained from the right pulmonary artery. Over a wire, the pigtail catheter was exchanged for a 105/18 cm multi side-hole EKOS ultrasound assisted infusion catheter. Slightly cranial to this initial access, the right internal jugular vein was again accessed with a micropuncture sheath ultimately allowing placement of a 6 French vascular sheath. With the use of a glidewire, a pigtail catheter was advanced into the left main pulmonary artery . Over an exchange length wire, the pigtail catheter was exchanged for a 105/12 cm multi side-hole EKOS ultrasound assisted infusion catheter. A postprocedural fluoroscopic image was obtained of the check demonstrating final catheter positioning. Both vascular sheath were secured at the right neck with interrupted 0 Prolene suture. The external catheter tubing was secured at the right chest and the lytic therapy was initiated. The patient tolerated the procedure well without immediate postprocedural complication. FINDINGS: Directly recorded proximal right pulmonary artery pressure measurements: 46/10 (23) mmHg (normal: < 25/10) Following the procedure, both ultrasound assisted infusion  catheter tips terminate within the distal aspects of the bilateral lower lobe sub segmental pulmonary arteries. IMPRESSION: 1. Successful fluoroscopic guided initiation of bilateral ultrasound assisted catheter directed pulmonary arterial lysis for sub massive pulmonary embolism and right-sided heart strain. 2. Markedly elevated pressure measurements within the right main pulmonary artery compatible with critical pulmonary arterial hypertension. PLAN: - The patient will be assessed approximately 12 hours following the initiation of the catheter directed pulmonary arterial lysis for repeat pressure measurements and either removal of the catheters or continuation of the catheter directed thrombolysis. Electronically Signed   By: Lucrezia Europe M.D.   On: 01/19/2017 12:17   Ir Jacolyn Reedy F/u Eval Art/ven Final Day (ms)  Result Date: 01/19/2017 CLINICAL DATA:  Sub massive pulmonary emboli, status post 12+ hour ultrasound assisted catheter directed bilateral pulmonary arterial thrombolytic infusion, complicated only by Mild sporadic hemoptysis. EXAM: IR THROMB F/U EVAL ART/VEN FINAL DAY ANESTHESIA/SEDATION: None used MEDICATIONS: None CONTRAST:  None PROCEDURE: Stable catheter position confirmed on preprocedure chest radiography. Repeat pulmonary arterial pressure measurements obtained bedside . The catheters and sheaths were removed and hemostasis achieved with manual compression. The patient tolerated the procedure well. COMPLICATIONS: None immediate FINDINGS: Postprocedure pulmonary artery pressure 41/15 (25) mmHg. IMPRESSION: 1. Technically successful catheter directed bilateral pulmonary artery ultrasound assisted thrombolytic infusion. Electronically Signed   By: Lucrezia Europe M.D.   On: 01/19/2017 12:04    STUDIES:  CTA-Pulmonary -> PE ECHO 3/8 >> LVEF 95-63%, grade 1 diastolic dysfunction, RV dilated, RA mod dilated, pa peak 64  CULTURES: None  ANTIBIOTICS: None  SIGNIFICANT EVENTS: 3/10 Code Blue called >  Syncope & VT   LINES/TUBES: R IJ TLC / EKOS 3/10 >>   DISCUSSION: 82 y/o woman with saddle PE and clinical instability.  IR consulted for EKOS.   ASSESSMENT / PLAN:  PULMONARY A: Massive Saddle PE with RV Strain P:   IR following, appreciate input EKOS protocol per IR Continue heparin gtt  Monitor for bleeding   CARDIOVASCULAR A:  VT R Heart failure Pulmonary Hypertension AF with RVR Chronic Diastolic HF P:  Monitor in ICU  Amiodarone gtt  Cardiology following  Trend troponin   RENAL A:   AKI P:   Trend BMP / urinary output Replace electrolytes as indicated Avoid nephrotoxic  agents, ensure adequate renal perfusion  GASTROINTESTINAL A:   No active issues P:   Clear liquids as tolerated PPI  PRN zofran   HEMATOLOGIC A:   Thrombocytopenia Acute Thromboembolism s/p EKOS P:  Monitor CBC  Heparin gtt for PE per pharmacy   INFECTIOUS A:   No active issues P:    ENDOCRINE A:   Obesity   P:    NEUROLOGIC A:   No active issues P:   RASS goal: 0   FAMILY  - Updates: Patient updated on plan of care 3/11 am.   - Inter-disciplinary family meet or Palliative Care meeting due by:  01/25/17  NP CC Time: 30 minutes   Noe Gens, NP-C [facility] Pgr: [phone] or if no answer [phone] 01/19/2017, 12:58 PM

## 2017-01-20 ENCOUNTER — Inpatient Hospital Stay (HOSPITAL_COMMUNITY): Payer: Medicare PPO

## 2017-01-20 DIAGNOSIS — I2601 Septic pulmonary embolism with acute cor pulmonale: Secondary | ICD-10-CM

## 2017-01-20 DIAGNOSIS — D696 Thrombocytopenia, unspecified: Secondary | ICD-10-CM

## 2017-01-20 DIAGNOSIS — R042 Hemoptysis: Secondary | ICD-10-CM

## 2017-01-20 DIAGNOSIS — R7401 Elevation of levels of liver transaminase levels: Secondary | ICD-10-CM

## 2017-01-20 DIAGNOSIS — N179 Acute kidney failure, unspecified: Secondary | ICD-10-CM

## 2017-01-20 DIAGNOSIS — R740 Nonspecific elevation of levels of transaminase and lactic acid dehydrogenase [LDH]: Secondary | ICD-10-CM

## 2017-01-20 HISTORY — DX: Elevation of levels of liver transaminase levels: R74.01

## 2017-01-20 HISTORY — DX: Thrombocytopenia, unspecified: D69.6

## 2017-01-20 HISTORY — DX: Hemoptysis: R04.2

## 2017-01-20 HISTORY — DX: Acute kidney failure, unspecified: N17.9

## 2017-01-20 LAB — BASIC METABOLIC PANEL
ANION GAP: 6 (ref 5–15)
BUN: 27 mg/dL — ABNORMAL HIGH (ref 6–20)
CALCIUM: 8.2 mg/dL — AB (ref 8.9–10.3)
CO2: 23 mmol/L (ref 22–32)
CREATININE: 1.06 mg/dL — AB (ref 0.44–1.00)
Chloride: 103 mmol/L (ref 101–111)
GFR calc non Af Amer: 48 mL/min — ABNORMAL LOW (ref >60)
GFR, EST AFRICAN AMERICAN: 56 mL/min — AB (ref >60)
Glucose, Bld: 140 mg/dL — ABNORMAL HIGH (ref 65–99)
Potassium: 3.9 mmol/L (ref 3.5–5.1)
Sodium: 132 mmol/L — ABNORMAL LOW (ref 135–145)

## 2017-01-20 LAB — CBC
HCT: 33.6 % — ABNORMAL LOW (ref 36.0–46.0)
Hemoglobin: 10.7 g/dL — ABNORMAL LOW (ref 12.0–15.0)
MCH: 29.5 pg (ref 26.0–34.0)
MCHC: 31.8 g/dL (ref 30.0–36.0)
MCV: 92.6 fL (ref 78.0–100.0)
Platelets: 72 10*3/uL — ABNORMAL LOW (ref 150–400)
RBC: 3.63 MIL/uL — ABNORMAL LOW (ref 3.87–5.11)
RDW: 15.8 % — AB (ref 11.5–15.5)
WBC: 6.1 10*3/uL (ref 4.0–10.5)

## 2017-01-20 LAB — FIBRINOGEN: FIBRINOGEN: 549 mg/dL — AB (ref 210–475)

## 2017-01-20 LAB — HEPARIN LEVEL (UNFRACTIONATED): Heparin Unfractionated: 0.43 IU/mL (ref 0.30–0.70)

## 2017-01-20 MED ORDER — ORAL CARE MOUTH RINSE
15.0000 mL | Freq: Two times a day (BID) | OROMUCOSAL | Status: DC
  Administered 2017-01-20 – 2017-01-22 (×4): 15 mL via OROMUCOSAL

## 2017-01-20 MED ORDER — AMIODARONE HCL 200 MG PO TABS
400.0000 mg | ORAL_TABLET | Freq: Two times a day (BID) | ORAL | Status: DC
  Administered 2017-01-20 – 2017-01-23 (×7): 400 mg via ORAL
  Filled 2017-01-20 (×7): qty 2

## 2017-01-20 NOTE — Assessment & Plan Note (Signed)
Improving  Plan Monitor with po diet

## 2017-01-20 NOTE — Assessment & Plan Note (Signed)
Now in sinus. Off amio gtt. On amio gtt. Being follwed by cards

## 2017-01-20 NOTE — Progress Notes (Signed)
PULMONARY / CRITICAL CARE MEDICINE   Name: Jennifer Medina MRN: [id number] DOB: 08/10/1935    ADMISSION DATE:  01/15/2017  CHIEF COMPLAINT:  Syncope  BRIEF SUMMARY:  Jennifer Medina is a 82 y/o woman admitted with saddle PE, syncope, who developed hypotension and on 3/10 developed another syncopal episode with VT seen on the monitor. She was brought back to the ICU, EKOS protocol initiated.  STUDIES:  CTA-Pulmonary -> PE ECHO 3/8 >> LVEF 29-93%, grade 1 diastolic dysfunction, RV dilated, RA mod dilated, pa peak 64  CULTURES: None  ANTIBIOTICS: None  SIGNIFICANT EVENTS: 3/10 Code Blue called > Syncope & VT R IJ TLC / EKOS 3/10 >>    SUBJECTIVE/OVERNIGHT/INTERVAL HX 3/12 - on 2L [place]. Eating clear liquid diet. Having mild hemoptysis. EKOS ended 01/19/17 approx 24h ago. Feeling improved dyspnea. In sinus and off amio per RN due to bradycardia sinus. On IV heparin gtt  VITAL SIGNS: BP (!) 113/57   Pulse (!) 59   Temp 97.5 F (36.4 C) (Oral)   Resp 17   Ht 5\' 5"  (1.651 m)   Wt 113.3 kg (249 lb 12.5 oz)   SpO2 100%   BMI 41.57 kg/m   HEMODYNAMICS:    VENTILATOR SETTINGS:    INTAKE / OUTPUT: I/O last 3 completed shifts: In: 2278.2 [P.O.:300; I.V.:1978.2] Out: 690 [Urine:690]  PHYSICAL EXAMINATION:  General Appearance:    Looks chroniccally unwell. OBESE - yes  Head:    Normocephalic, without obvious abnormality, atraumatic  Eyes:    PERRL - yes, conjunctiva/corneas - clear      Ears:    Normal external ear canals, both ears  Nose:   NG tube - no  Throat:  ETT TUBE - no , OG tube - no  Neck:   Supple,  No enlargement/tenderness/nodules. Rt I J site bandaid clean and no hematoma     Lungs:     Clear to auscultation bilaterally  Chest wall:    No deformity  Heart:    S1 and S2 normal, no murmur, CVP - no.  Pressors - no  Abdomen:     Soft, no masses, no organomegaly  Genitalia:    Not done  Rectal:   not done  Extremities:   Extremities- mild chronic edema      Skin:   Intact in exposed areas . Sacral area - none reported decuib     Neurologic:   Sedation - none -> RASS - +1 . Moves all 4s - yes. CAM-ICU - neg for delirium . Orientation - fully oriented      LABS: PULMONARY No results for input(s): PHART, PCO2ART, PO2ART, HCO3, TCO2, O2SAT in the last 168 hours.  Invalid input(s): PCO2, PO2  CBC  Recent Labs Lab 01/19/17 0549 01/19/17 1245 01/20/17 0431  HGB 11.9* 11.5* 10.7*  HCT 37.9 36.3 33.6*  WBC 8.4 7.7 6.1  PLT 75* 77* 72*    COAGULATION  Recent Labs Lab 01/16/17 0003  INR 1.20    CARDIAC   Recent Labs Lab 01/15/17 2338 01/16/17 0545 01/16/17 1224  TROPONINI 0.38* 0.30* 0.23*   No results for input(s): PROBNP in the last 168 hours.   CHEMISTRY  Recent Labs Lab 01/15/17 2338 01/16/17 0545 01/17/17 0529 01/18/17 0356 01/18/17 2101 01/20/17 0431  NA 138 139 138 135  --  132*  K 4.1 4.0 3.9 4.4  --  3.9  CL 102 109 107 102  --  103  CO2 19* 19* 20* 23  --  23  GLUCOSE 127* 139* 120* 163*  --  140*  BUN 29* 28* 23* 26*  --  27*  CREATININE 1.40* 1.35* 1.13* 1.28*  --  1.06*  CALCIUM 8.3* 7.8* 8.6* 8.9  --  8.2*  MG  --   --   --   --  1.7  --    Estimated Creatinine Clearance: 53.1 mL/min (by C-G formula based on SCr of 1.06 mg/dL (H)).   LIVER  Recent Labs Lab 01/15/17 2338 01/16/17 0003  AST 94*  --   ALT 58*  --   ALKPHOS 138*  --   BILITOT 1.0  --   PROT 5.7*  --   ALBUMIN 2.7*  --   INR  --  1.20     INFECTIOUS  Recent Labs Lab 01/15/17 2338 01/16/17 0545  LATICACIDVEN 2.6* 2.0*     ENDOCRINE CBG (last 3)  No results for input(s): GLUCAP in the last 72 hours.       IMAGING x48h  - image(s) personally visualized  -   highlighted in bold Ir Angiogram Pulmonary Bilateral Selective  Result Date: 01/19/2017 INDICATION: Acute bilateral pulmonary emboli. Recent new cardiac arrhythmia and progressive right heart strain. EXAM: 1. ULTRASOUND GUIDANCE FOR VENOUS ACCESS  X2 2. DIRECT PULMONARY ARTERY PRESSURE MEASUREMENTS 3. FLUOROSCOPIC GUIDED PLACEMENT OF BILATERAL PULMONARY ARTERIAL LYTIC INFUSION CATHETERS COMPARISON:  Chest CTA - 01/15/2017 MEDICATIONS: Intravenous Fentanyl and Versed were administered as conscious sedation during continuous monitoring of the patient's level of consciousness and physiological / cardiorespiratory status by the radiology RN, with a total moderate sedation time of 83minutes. CONTRAST:  None used FLUOROSCOPY TIME:  6.9 min  (82  uGym2 DAP) COMPLICATIONS: None immediate TECHNIQUE: Informed written consent was obtained from the patient after a discussion of the risks, benefits and alternatives to treatment. Questions regarding the procedure were encouraged and answered. A timeout was performed prior to the initiation of the procedure. Previous ultrasound had demonstrated right lower extremity DVT. Ultrasound scanning was performed of the right IJ vein demonstrating widely patent. As such, the right internal jugular vein was selected for vascular access. The right neck was prepped and draped in the usual sterile fashion, and a sterile drape was applied covering the operative field. Maximum barrier sterile technique with sterile gowns and gloves were used for the procedure. A timeout was performed prior to the initiation of the procedure. Local anesthesia was provided with 1% lidocaine. Under direct ultrasound guidance, the right internal jugular vein was accessed with a micro puncture sheath ultimately allowing placement of a 6 French vascular sheath. With the use of a glidewire, an angled pigtail catheter was advanced into the right main pulmonary artery and Pressure measurements were then obtained from the right pulmonary artery. Over a wire, the pigtail catheter was exchanged for a 105/18 cm multi side-hole EKOS ultrasound assisted infusion catheter. Slightly cranial to this initial access, the right internal jugular vein was again accessed with a  micropuncture sheath ultimately allowing placement of a 6 French vascular sheath. With the use of a glidewire, a pigtail catheter was advanced into the left main pulmonary artery . Over an exchange length wire, the pigtail catheter was exchanged for a 105/12 cm multi side-hole EKOS ultrasound assisted infusion catheter. A postprocedural fluoroscopic image was obtained of the check demonstrating final catheter positioning. Both vascular sheath were secured at the right neck with interrupted 0 Prolene suture. The external catheter tubing was secured at the right chest and the lytic therapy was  initiated. The patient tolerated the procedure well without immediate postprocedural complication. FINDINGS: Directly recorded proximal right pulmonary artery pressure measurements: 46/10 (23) mmHg (normal: < 25/10) Following the procedure, both ultrasound assisted infusion catheter tips terminate within the distal aspects of the bilateral lower lobe sub segmental pulmonary arteries. IMPRESSION: 1. Successful fluoroscopic guided initiation of bilateral ultrasound assisted catheter directed pulmonary arterial lysis for sub massive pulmonary embolism and right-sided heart strain. 2. Markedly elevated pressure measurements within the right main pulmonary artery compatible with critical pulmonary arterial hypertension. PLAN: - The patient will be assessed approximately 12 hours following the initiation of the catheter directed pulmonary arterial lysis for repeat pressure measurements and either removal of the catheters or continuation of the catheter directed thrombolysis. Electronically Signed   By: Lucrezia Europe M.D.   On: 01/19/2017 12:17   Ir Angiogram Selective Each Additional Vessel  Result Date: 01/19/2017 INDICATION: Acute bilateral pulmonary emboli. Recent new cardiac arrhythmia and progressive right heart strain. EXAM: 1. ULTRASOUND GUIDANCE FOR VENOUS ACCESS X2 2. DIRECT PULMONARY ARTERY PRESSURE MEASUREMENTS 3.  FLUOROSCOPIC GUIDED PLACEMENT OF BILATERAL PULMONARY ARTERIAL LYTIC INFUSION CATHETERS COMPARISON:  Chest CTA - 01/15/2017 MEDICATIONS: Intravenous Fentanyl and Versed were administered as conscious sedation during continuous monitoring of the patient's level of consciousness and physiological / cardiorespiratory status by the radiology RN, with a total moderate sedation time of 76minutes. CONTRAST:  None used FLUOROSCOPY TIME:  6.9 min  (82  uGym2 DAP) COMPLICATIONS: None immediate TECHNIQUE: Informed written consent was obtained from the patient after a discussion of the risks, benefits and alternatives to treatment. Questions regarding the procedure were encouraged and answered. A timeout was performed prior to the initiation of the procedure. Previous ultrasound had demonstrated right lower extremity DVT. Ultrasound scanning was performed of the right IJ vein demonstrating widely patent. As such, the right internal jugular vein was selected for vascular access. The right neck was prepped and draped in the usual sterile fashion, and a sterile drape was applied covering the operative field. Maximum barrier sterile technique with sterile gowns and gloves were used for the procedure. A timeout was performed prior to the initiation of the procedure. Local anesthesia was provided with 1% lidocaine. Under direct ultrasound guidance, the right internal jugular vein was accessed with a micro puncture sheath ultimately allowing placement of a 6 French vascular sheath. With the use of a glidewire, an angled pigtail catheter was advanced into the right main pulmonary artery and Pressure measurements were then obtained from the right pulmonary artery. Over a wire, the pigtail catheter was exchanged for a 105/18 cm multi side-hole EKOS ultrasound assisted infusion catheter. Slightly cranial to this initial access, the right internal jugular vein was again accessed with a micropuncture sheath ultimately allowing placement of a  6 French vascular sheath. With the use of a glidewire, a pigtail catheter was advanced into the left main pulmonary artery . Over an exchange length wire, the pigtail catheter was exchanged for a 105/12 cm multi side-hole EKOS ultrasound assisted infusion catheter. A postprocedural fluoroscopic image was obtained of the check demonstrating final catheter positioning. Both vascular sheath were secured at the right neck with interrupted 0 Prolene suture. The external catheter tubing was secured at the right chest and the lytic therapy was initiated. The patient tolerated the procedure well without immediate postprocedural complication. FINDINGS: Directly recorded proximal right pulmonary artery pressure measurements: 46/10 (23) mmHg (normal: < 25/10) Following the procedure, both ultrasound assisted infusion catheter tips terminate within the distal aspects of  the bilateral lower lobe sub segmental pulmonary arteries. IMPRESSION: 1. Successful fluoroscopic guided initiation of bilateral ultrasound assisted catheter directed pulmonary arterial lysis for sub massive pulmonary embolism and right-sided heart strain. 2. Markedly elevated pressure measurements within the right main pulmonary artery compatible with critical pulmonary arterial hypertension. PLAN: - The patient will be assessed approximately 12 hours following the initiation of the catheter directed pulmonary arterial lysis for repeat pressure measurements and either removal of the catheters or continuation of the catheter directed thrombolysis. Electronically Signed   By: Lucrezia Europe M.D.   On: 01/19/2017 12:17   Ir Angiogram Selective Each Additional Vessel  Result Date: 01/19/2017 INDICATION: Acute bilateral pulmonary emboli. Recent new cardiac arrhythmia and progressive right heart strain. EXAM: 1. ULTRASOUND GUIDANCE FOR VENOUS ACCESS X2 2. DIRECT PULMONARY ARTERY PRESSURE MEASUREMENTS 3. FLUOROSCOPIC GUIDED PLACEMENT OF BILATERAL PULMONARY ARTERIAL  LYTIC INFUSION CATHETERS COMPARISON:  Chest CTA - 01/15/2017 MEDICATIONS: Intravenous Fentanyl and Versed were administered as conscious sedation during continuous monitoring of the patient's level of consciousness and physiological / cardiorespiratory status by the radiology RN, with a total moderate sedation time of 38minutes. CONTRAST:  None used FLUOROSCOPY TIME:  6.9 min  (82  uGym2 DAP) COMPLICATIONS: None immediate TECHNIQUE: Informed written consent was obtained from the patient after a discussion of the risks, benefits and alternatives to treatment. Questions regarding the procedure were encouraged and answered. A timeout was performed prior to the initiation of the procedure. Previous ultrasound had demonstrated right lower extremity DVT. Ultrasound scanning was performed of the right IJ vein demonstrating widely patent. As such, the right internal jugular vein was selected for vascular access. The right neck was prepped and draped in the usual sterile fashion, and a sterile drape was applied covering the operative field. Maximum barrier sterile technique with sterile gowns and gloves were used for the procedure. A timeout was performed prior to the initiation of the procedure. Local anesthesia was provided with 1% lidocaine. Under direct ultrasound guidance, the right internal jugular vein was accessed with a micro puncture sheath ultimately allowing placement of a 6 French vascular sheath. With the use of a glidewire, an angled pigtail catheter was advanced into the right main pulmonary artery and Pressure measurements were then obtained from the right pulmonary artery. Over a wire, the pigtail catheter was exchanged for a 105/18 cm multi side-hole EKOS ultrasound assisted infusion catheter. Slightly cranial to this initial access, the right internal jugular vein was again accessed with a micropuncture sheath ultimately allowing placement of a 6 French vascular sheath. With the use of a glidewire, a  pigtail catheter was advanced into the left main pulmonary artery . Over an exchange length wire, the pigtail catheter was exchanged for a 105/12 cm multi side-hole EKOS ultrasound assisted infusion catheter. A postprocedural fluoroscopic image was obtained of the check demonstrating final catheter positioning. Both vascular sheath were secured at the right neck with interrupted 0 Prolene suture. The external catheter tubing was secured at the right chest and the lytic therapy was initiated. The patient tolerated the procedure well without immediate postprocedural complication. FINDINGS: Directly recorded proximal right pulmonary artery pressure measurements: 46/10 (23) mmHg (normal: < 25/10) Following the procedure, both ultrasound assisted infusion catheter tips terminate within the distal aspects of the bilateral lower lobe sub segmental pulmonary arteries. IMPRESSION: 1. Successful fluoroscopic guided initiation of bilateral ultrasound assisted catheter directed pulmonary arterial lysis for sub massive pulmonary embolism and right-sided heart strain. 2. Markedly elevated pressure measurements within the right main  pulmonary artery compatible with critical pulmonary arterial hypertension. PLAN: - The patient will be assessed approximately 12 hours following the initiation of the catheter directed pulmonary arterial lysis for repeat pressure measurements and either removal of the catheters or continuation of the catheter directed thrombolysis. Electronically Signed   By: Lucrezia Europe M.D.   On: 01/19/2017 12:17   Ir US Guide Vasc Access Right  Result Date: 01/19/2017 INDICATION: Acute bilateral pulmonary emboli. Recent new cardiac arrhythmia and progressive right heart strain. EXAM: 1. ULTRASOUND GUIDANCE FOR VENOUS ACCESS X2 2. DIRECT PULMONARY ARTERY PRESSURE MEASUREMENTS 3. FLUOROSCOPIC GUIDED PLACEMENT OF BILATERAL PULMONARY ARTERIAL LYTIC INFUSION CATHETERS COMPARISON:  Chest CTA - 01/15/2017 MEDICATIONS:  Intravenous Fentanyl and Versed were administered as conscious sedation during continuous monitoring of the patient's level of consciousness and physiological / cardiorespiratory status by the radiology RN, with a total moderate sedation time of 17minutes. CONTRAST:  None used FLUOROSCOPY TIME:  6.9 min  (82  uGym2 DAP) COMPLICATIONS: None immediate TECHNIQUE: Informed written consent was obtained from the patient after a discussion of the risks, benefits and alternatives to treatment. Questions regarding the procedure were encouraged and answered. A timeout was performed prior to the initiation of the procedure. Previous ultrasound had demonstrated right lower extremity DVT. Ultrasound scanning was performed of the right IJ vein demonstrating widely patent. As such, the right internal jugular vein was selected for vascular access. The right neck was prepped and draped in the usual sterile fashion, and a sterile drape was applied covering the operative field. Maximum barrier sterile technique with sterile gowns and gloves were used for the procedure. A timeout was performed prior to the initiation of the procedure. Local anesthesia was provided with 1% lidocaine. Under direct ultrasound guidance, the right internal jugular vein was accessed with a micro puncture sheath ultimately allowing placement of a 6 French vascular sheath. With the use of a glidewire, an angled pigtail catheter was advanced into the right main pulmonary artery and Pressure measurements were then obtained from the right pulmonary artery. Over a wire, the pigtail catheter was exchanged for a 105/18 cm multi side-hole EKOS ultrasound assisted infusion catheter. Slightly cranial to this initial access, the right internal jugular vein was again accessed with a micropuncture sheath ultimately allowing placement of a 6 French vascular sheath. With the use of a glidewire, a pigtail catheter was advanced into the left main pulmonary artery . Over an  exchange length wire, the pigtail catheter was exchanged for a 105/12 cm multi side-hole EKOS ultrasound assisted infusion catheter. A postprocedural fluoroscopic image was obtained of the check demonstrating final catheter positioning. Both vascular sheath were secured at the right neck with interrupted 0 Prolene suture. The external catheter tubing was secured at the right chest and the lytic therapy was initiated. The patient tolerated the procedure well without immediate postprocedural complication. FINDINGS: Directly recorded proximal right pulmonary artery pressure measurements: 46/10 (23) mmHg (normal: < 25/10) Following the procedure, both ultrasound assisted infusion catheter tips terminate within the distal aspects of the bilateral lower lobe sub segmental pulmonary arteries. IMPRESSION: 1. Successful fluoroscopic guided initiation of bilateral ultrasound assisted catheter directed pulmonary arterial lysis for sub massive pulmonary embolism and right-sided heart strain. 2. Markedly elevated pressure measurements within the right main pulmonary artery compatible with critical pulmonary arterial hypertension. PLAN: - The patient will be assessed approximately 12 hours following the initiation of the catheter directed pulmonary arterial lysis for repeat pressure measurements and either removal of the catheters or continuation of  the catheter directed thrombolysis. Electronically Signed   By: Lucrezia Europe M.D.   On: 01/19/2017 12:17   Dg Chest Port 1 View  Result Date: 01/20/2017 CLINICAL DATA:  Respiratory failure. EXAM: PORTABLE CHEST 1 VIEW COMPARISON:  01/19/2017. FINDINGS: Interim removal of pulmonary infusion catheters. Stable cardiomegaly. Low lung volumes with basilar subsegmental atelectasis. Small right pleural effusion cannot be excluded. No pneumothorax. Postsurgical changes right shoulder. IMPRESSION: 1. Interim removal of pulmonary infusion catheters. 2. Stable cardiomegaly. 3. Low lung volumes  with mild basilar atelectasis . Small right pleural effusion cannot be excluded. Electronically Signed   By: Marcello Moores  Register   On: 01/20/2017 07:05   Dg Chest Port 1 View  Result Date: 01/19/2017 CLINICAL DATA:  82 year old female with acute saddle embolus. Postoperative day 1 status post catheter directed ultrasound-assisted bilateral pulmonary arterial thrombolysis initiated via R IJ x2. Hemoptysis EXAM: PORTABLE CHEST 1 VIEW COMPARISON:  01/15/2017 portable chest and earlier. FINDINGS: Portable AP semi upright view at 0947 hours. Right IJ approach pulmonary artery infusion catheters are in place. Left chest pacer or resuscitation pads are in place. Stable lung volumes. Stable cardiac size and mediastinal contours. Allowing for portable technique the lungs are clear. IMPRESSION: Right IJ approach pulmonary artery infusion catheters in place, otherwise stable and negative portable radiographic appearance of the chest. Electronically Signed   By: Genevie Ann M.D.   On: 01/19/2017 10:07   Ir Infusion Thrombol Arterial Initial (ms)  Result Date: 01/19/2017 INDICATION: Acute bilateral pulmonary emboli. Recent new cardiac arrhythmia and progressive right heart strain. EXAM: 1. ULTRASOUND GUIDANCE FOR VENOUS ACCESS X2 2. DIRECT PULMONARY ARTERY PRESSURE MEASUREMENTS 3. FLUOROSCOPIC GUIDED PLACEMENT OF BILATERAL PULMONARY ARTERIAL LYTIC INFUSION CATHETERS COMPARISON:  Chest CTA - 01/15/2017 MEDICATIONS: Intravenous Fentanyl and Versed were administered as conscious sedation during continuous monitoring of the patient's level of consciousness and physiological / cardiorespiratory status by the radiology RN, with a total moderate sedation time of 66minutes. CONTRAST:  None used FLUOROSCOPY TIME:  6.9 min  (82  uGym2 DAP) COMPLICATIONS: None immediate TECHNIQUE: Informed written consent was obtained from the patient after a discussion of the risks, benefits and alternatives to treatment. Questions regarding the  procedure were encouraged and answered. A timeout was performed prior to the initiation of the procedure. Previous ultrasound had demonstrated right lower extremity DVT. Ultrasound scanning was performed of the right IJ vein demonstrating widely patent. As such, the right internal jugular vein was selected for vascular access. The right neck was prepped and draped in the usual sterile fashion, and a sterile drape was applied covering the operative field. Maximum barrier sterile technique with sterile gowns and gloves were used for the procedure. A timeout was performed prior to the initiation of the procedure. Local anesthesia was provided with 1% lidocaine. Under direct ultrasound guidance, the right internal jugular vein was accessed with a micro puncture sheath ultimately allowing placement of a 6 French vascular sheath. With the use of a glidewire, an angled pigtail catheter was advanced into the right main pulmonary artery and Pressure measurements were then obtained from the right pulmonary artery. Over a wire, the pigtail catheter was exchanged for a 105/18 cm multi side-hole EKOS ultrasound assisted infusion catheter. Slightly cranial to this initial access, the right internal jugular vein was again accessed with a micropuncture sheath ultimately allowing placement of a 6 French vascular sheath. With the use of a glidewire, a pigtail catheter was advanced into the left main pulmonary artery . Over an exchange length wire,  the pigtail catheter was exchanged for a 105/12 cm multi side-hole EKOS ultrasound assisted infusion catheter. A postprocedural fluoroscopic image was obtained of the check demonstrating final catheter positioning. Both vascular sheath were secured at the right neck with interrupted 0 Prolene suture. The external catheter tubing was secured at the right chest and the lytic therapy was initiated. The patient tolerated the procedure well without immediate postprocedural complication.  FINDINGS: Directly recorded proximal right pulmonary artery pressure measurements: 46/10 (23) mmHg (normal: < 25/10) Following the procedure, both ultrasound assisted infusion catheter tips terminate within the distal aspects of the bilateral lower lobe sub segmental pulmonary arteries. IMPRESSION: 1. Successful fluoroscopic guided initiation of bilateral ultrasound assisted catheter directed pulmonary arterial lysis for sub massive pulmonary embolism and right-sided heart strain. 2. Markedly elevated pressure measurements within the right main pulmonary artery compatible with critical pulmonary arterial hypertension. PLAN: - The patient will be assessed approximately 12 hours following the initiation of the catheter directed pulmonary arterial lysis for repeat pressure measurements and either removal of the catheters or continuation of the catheter directed thrombolysis. Electronically Signed   By: Lucrezia Europe M.D.   On: 01/19/2017 12:17   Ir Infusion Thrombol Arterial Initial (ms)  Result Date: 01/19/2017 INDICATION: Acute bilateral pulmonary emboli. Recent new cardiac arrhythmia and progressive right heart strain. EXAM: 1. ULTRASOUND GUIDANCE FOR VENOUS ACCESS X2 2. DIRECT PULMONARY ARTERY PRESSURE MEASUREMENTS 3. FLUOROSCOPIC GUIDED PLACEMENT OF BILATERAL PULMONARY ARTERIAL LYTIC INFUSION CATHETERS COMPARISON:  Chest CTA - 01/15/2017 MEDICATIONS: Intravenous Fentanyl and Versed were administered as conscious sedation during continuous monitoring of the patient's level of consciousness and physiological / cardiorespiratory status by the radiology RN, with a total moderate sedation time of 19minutes. CONTRAST:  None used FLUOROSCOPY TIME:  6.9 min  (82  uGym2 DAP) COMPLICATIONS: None immediate TECHNIQUE: Informed written consent was obtained from the patient after a discussion of the risks, benefits and alternatives to treatment. Questions regarding the procedure were encouraged and answered. A timeout was  performed prior to the initiation of the procedure. Previous ultrasound had demonstrated right lower extremity DVT. Ultrasound scanning was performed of the right IJ vein demonstrating widely patent. As such, the right internal jugular vein was selected for vascular access. The right neck was prepped and draped in the usual sterile fashion, and a sterile drape was applied covering the operative field. Maximum barrier sterile technique with sterile gowns and gloves were used for the procedure. A timeout was performed prior to the initiation of the procedure. Local anesthesia was provided with 1% lidocaine. Under direct ultrasound guidance, the right internal jugular vein was accessed with a micro puncture sheath ultimately allowing placement of a 6 French vascular sheath. With the use of a glidewire, an angled pigtail catheter was advanced into the right main pulmonary artery and Pressure measurements were then obtained from the right pulmonary artery. Over a wire, the pigtail catheter was exchanged for a 105/18 cm multi side-hole EKOS ultrasound assisted infusion catheter. Slightly cranial to this initial access, the right internal jugular vein was again accessed with a micropuncture sheath ultimately allowing placement of a 6 French vascular sheath. With the use of a glidewire, a pigtail catheter was advanced into the left main pulmonary artery . Over an exchange length wire, the pigtail catheter was exchanged for a 105/12 cm multi side-hole EKOS ultrasound assisted infusion catheter. A postprocedural fluoroscopic image was obtained of the check demonstrating final catheter positioning. Both vascular sheath were secured at the right neck with interrupted 0  Prolene suture. The external catheter tubing was secured at the right chest and the lytic therapy was initiated. The patient tolerated the procedure well without immediate postprocedural complication. FINDINGS: Directly recorded proximal right pulmonary artery  pressure measurements: 46/10 (23) mmHg (normal: < 25/10) Following the procedure, both ultrasound assisted infusion catheter tips terminate within the distal aspects of the bilateral lower lobe sub segmental pulmonary arteries. IMPRESSION: 1. Successful fluoroscopic guided initiation of bilateral ultrasound assisted catheter directed pulmonary arterial lysis for sub massive pulmonary embolism and right-sided heart strain. 2. Markedly elevated pressure measurements within the right main pulmonary artery compatible with critical pulmonary arterial hypertension. PLAN: - The patient will be assessed approximately 12 hours following the initiation of the catheter directed pulmonary arterial lysis for repeat pressure measurements and either removal of the catheters or continuation of the catheter directed thrombolysis. Electronically Signed   By: Lucrezia Europe M.D.   On: 01/19/2017 12:17   Ir Jacolyn Reedy F/u Eval Art/ven Final Day (ms)  Result Date: 01/19/2017 CLINICAL DATA:  Sub massive pulmonary emboli, status post 12+ hour ultrasound assisted catheter directed bilateral pulmonary arterial thrombolytic infusion, complicated only by Mild sporadic hemoptysis. EXAM: IR THROMB F/U EVAL ART/VEN FINAL DAY ANESTHESIA/SEDATION: None used MEDICATIONS: None CONTRAST:  None PROCEDURE: Stable catheter position confirmed on preprocedure chest radiography. Repeat pulmonary arterial pressure measurements obtained bedside . The catheters and sheaths were removed and hemostasis achieved with manual compression. The patient tolerated the procedure well. COMPLICATIONS: None immediate FINDINGS: Postprocedure pulmonary artery pressure 41/15 (25) mmHg. IMPRESSION: 1. Technically successful catheter directed bilateral pulmonary artery ultrasound assisted thrombolytic infusion. Electronically Signed   By: Lucrezia Europe M.D.   On: 01/19/2017 12:04       DISCUSSION: 82 y/o woman with saddle PE and clinical instability.  IR consulted for EKOS.    ASSESSMENT and PLAN  Acute pulmonary embolism ([place]) S/p EKOS ending 11am 3/11/8 No on IV heparin; baesline low platletts of 70-90s and prio hx of bleeiding keep her at elevated bleeding risk  Plan Monitor in IV heparin for another 24-48h Then change to oral agent; choice per triad hospitalist - if creat ok, NOAC might ben better  Hyperglycemia ssi  Atrial fibrillation with RVR ([place]) Now in sinus. Off amio gtt. On amio gtt. Being follwed by cards  AKI (acute kidney injury) ([place]) Improving  Plan Monitor with po diet  Thrombocytopenia ([place]) Plat 99 at admit . Currently at 63s. True baseline not known  Plan Monitor Will need to sort out true platlele baseline  Transaminitis Mild raised lft on 01/15/17  Plan Recheck 01/21/17   Hemoptysis Having hemoptysis post EKOS. Mild on 01/20/2017  Plan Continue IV heparin Monitor If worse, stop heparin gtt and Rx +/- protamine      FAMILY  - Updates: 01/20/2017 --> none at bedside. Patient updated  - Inter-disciplinary family meet or Palliative Care meeting due by:  DAy 7. Current LOS is LOS 5 days  CODE STATUS    Code Status Orders        Start     Ordered   01/15/17 2330  Full code  Continuous     01/15/17 2331    Code Status History    Date Active Date Inactive Code Status Order ID Comments User Context   This patient has a current code status but no historical code status.        DISPO Transfer to tele 01/20/2017 and pccm rounding 1 time - for opd followup needed. TRH primary d/w  Dr Thereasa Solo      Dr. Brand Males, M.D., F.C.C.P Pulmonary and Critical Care Medicine Staff Physician [facility] Pager: [phone], If no answer or between  15:00h - 7:00h: call [phone]  01/20/2017 11:28 AM

## 2017-01-20 NOTE — Progress Notes (Signed)
ANTICOAGULATION CONSULT NOTE - Follow Up Consult  Pharmacy Consult for Heparin Indication: pulmonary embolus  Allergies  Allergen Reactions  . Aspirin Other (See Comments)    GI bleed  . Nsaids Other (See Comments)    GI bleed  . Penicillins Shortness Of Breath and Rash  . Simvastatin Nausea Only  . Vancomycin Rash    Patient Measurements: Height: 5\' 5"  (165.1 cm) Weight: 249 lb 12.5 oz (113.3 kg) IBW/kg (Calculated) : 57 Heparin Dosing Weight: 79 Kg  Vital Signs: Temp: 97.5 F (36.4 C) (03/12 0750) Temp Source: Oral (03/12 0750) BP: 113/57 (03/12 1100) Pulse Rate: 59 (03/12 1100)  Labs:  Recent Labs  01/18/17 0356  01/19/17 0549  01/19/17 1245 01/19/17 1600 01/19/17 2200 01/20/17 0431  HGB 12.6  < > 11.9*  --  11.5*  --   --  10.7*  HCT 40.3  < > 37.9  --  36.3  --   --  33.6*  PLT 96*  < > 75*  --  77*  --   --  72*  HEPARINUNFRC 0.61  < >  --   < >  --  0.43 0.36 0.43  CREATININE 1.28*  --   --   --   --   --   --  1.06*  < > = values in this interval not displayed.  Estimated Creatinine Clearance: 53.1 mL/min (by C-G formula based on SCr of 1.06 mg/dL (H)).  Assessment: 82 year old female on heparin for extensive bilateral pulmonary embolism with R heart strain and atrial fibrillation. S/p cath directed USAT (alteplase) 3/10-3/11. Heparin level therapeutic (0.43) on 1050 units/hr. Would like heparin level in upper end of range with large clot burden. Pt with a little bit of blood tinged sputum so will follow closely - continues but remains mild. Platelets are down to 72 (90 on admission).  Goal of Therapy:  Heparin level 0.3-0.7 units/ml Monitor platelets by anticoagulation protocol: Yes  Plan: Continue heparin to 1050 units/hr Daily HL/CBC  Sloan Leiter, PharmD, BCPS Clinical Pharmacist [phone] 01/20/2017 11:24 AM

## 2017-01-20 NOTE — Assessment & Plan Note (Signed)
Having hemoptysis post EKOS. Mild on 01/20/2017  Plan Continue IV heparin Monitor If worse, stop heparin gtt and Rx +/- protamine

## 2017-01-20 NOTE — Progress Notes (Signed)
Report given to [facility] on [facility]west. Pt will be transferred via bed, IV pole and nurse. VS WNL.

## 2017-01-20 NOTE — Progress Notes (Signed)
[facility] Progress Note Patient Name: Jennifer Medina DOB: [birth year] MRN: [id number]   Date of Service  01/20/2017  HPI/Events of Note  Loletha Grayer, on amio  eICU Interventions  Hold amio     Intervention Category Major Interventions: Arrhythmia - evaluation and management  Simonne Maffucci 01/20/2017, 3:23 AM

## 2017-01-20 NOTE — Assessment & Plan Note (Signed)
Mild raised lft on 01/15/17  Plan Recheck 01/21/17

## 2017-01-20 NOTE — Assessment & Plan Note (Signed)
ssi 

## 2017-01-20 NOTE — Assessment & Plan Note (Signed)
S/p EKOS ending 11am 3/11/8 No on IV heparin; baesline low platletts of 70-90s and prio hx of bleeiding keep her at elevated bleeding risk  Plan Monitor in IV heparin for another 24-48h Then change to oral agent; choice per triad hospitalist - if creat ok, NOAC might ben better

## 2017-01-20 NOTE — Progress Notes (Signed)
Patient ID: Jennifer Medina, female   DOB: 04-12-1935, 82 y.o.   MRN: [id number]    Referring Physician(s): Dr. Chesley Mires  Supervising Physician: Aletta Edouard  Patient Status: [facility] - In-pt  Chief Complaint: PE  Subjective: Patient feels much better s/p thrombolysis.  Breathing is better.  Allergies: Aspirin; Nsaids; Penicillins; Simvastatin; and Vancomycin  Medications: Prior to Admission medications   Medication Sig Start Date End Date Taking? Authorizing Provider  acetaminophen (TYLENOL) 325 MG tablet Take 650 mg by mouth every 4 (four) hours as needed for moderate pain or headache.    Yes Historical Provider, MD  atorvastatin (LIPITOR) 40 MG tablet Take 40 mg by mouth at bedtime.   Yes Historical Provider, MD  Camphor (JOINTFLEX EX) Apply 1 application topically as needed (for pain).   Yes Historical Provider, MD  furosemide (LASIX) 40 MG tablet Take 40 mg by mouth daily.   Yes Historical Provider, MD  latanoprost (XALATAN) 0.005 % ophthalmic solution Place 1 drop into both eyes at bedtime.   Yes Historical Provider, MD  loperamide (IMODIUM A-D) 2 MG tablet Take 2 mg by mouth 4 (four) times daily as needed for diarrhea or loose stools.   Yes Historical Provider, MD  LORazepam (ATIVAN) 0.5 MG tablet Take 0.5 mg by mouth every 8 (eight) hours as needed for anxiety.   Yes Historical Provider, MD  metoprolol succinate (TOPROL-XL) 25 MG 24 hr tablet Take 25 mg by mouth daily.   Yes Historical Provider, MD  omeprazole (PRILOSEC) 40 MG capsule Take 40 mg by mouth daily.   Yes Historical Provider, MD  potassium chloride SA (K-DUR,KLOR-CON) 20 MEQ tablet Take 20 mEq by mouth daily.   Yes Historical Provider, MD  traMADol (ULTRAM) 50 MG tablet Take 50 mg by mouth every 6 (six) hours as needed for moderate pain.    Yes Historical Provider, MD    Vital Signs: BP (!) 89/58   Pulse 60   Temp 97.5 F (36.4 C) (Oral)   Resp 16   Ht 5\' 5"  (1.651 m)   Wt 249 lb 12.5 oz (113.3 kg)    SpO2 (!) 87%   BMI 41.57 kg/m   Physical Exam: Neck: right neck IJ site is c/d/i with no evidence of hematoma or infection.  Imaging: Ir Angiogram Pulmonary Bilateral Selective  Result Date: 01/19/2017 INDICATION: Acute bilateral pulmonary emboli. Recent new cardiac arrhythmia and progressive right heart strain. EXAM: 1. ULTRASOUND GUIDANCE FOR VENOUS ACCESS X2 2. DIRECT PULMONARY ARTERY PRESSURE MEASUREMENTS 3. FLUOROSCOPIC GUIDED PLACEMENT OF BILATERAL PULMONARY ARTERIAL LYTIC INFUSION CATHETERS COMPARISON:  Chest CTA - 01/15/2017 MEDICATIONS: Intravenous Fentanyl and Versed were administered as conscious sedation during continuous monitoring of the patient's level of consciousness and physiological / cardiorespiratory status by the radiology RN, with a total moderate sedation time of 23minutes. CONTRAST:  None used FLUOROSCOPY TIME:  6.9 min  (82  uGym2 DAP) COMPLICATIONS: None immediate TECHNIQUE: Informed written consent was obtained from the patient after a discussion of the risks, benefits and alternatives to treatment. Questions regarding the procedure were encouraged and answered. A timeout was performed prior to the initiation of the procedure. Previous ultrasound had demonstrated right lower extremity DVT. Ultrasound scanning was performed of the right IJ vein demonstrating widely patent. As such, the right internal jugular vein was selected for vascular access. The right neck was prepped and draped in the usual sterile fashion, and a sterile drape was applied covering the operative field. Maximum barrier sterile technique with sterile gowns and  gloves were used for the procedure. A timeout was performed prior to the initiation of the procedure. Local anesthesia was provided with 1% lidocaine. Under direct ultrasound guidance, the right internal jugular vein was accessed with a micro puncture sheath ultimately allowing placement of a 6 French vascular sheath. With the use of a glidewire, an  angled pigtail catheter was advanced into the right main pulmonary artery and Pressure measurements were then obtained from the right pulmonary artery. Over a wire, the pigtail catheter was exchanged for a 105/18 cm multi side-hole EKOS ultrasound assisted infusion catheter. Slightly cranial to this initial access, the right internal jugular vein was again accessed with a micropuncture sheath ultimately allowing placement of a 6 French vascular sheath. With the use of a glidewire, a pigtail catheter was advanced into the left main pulmonary artery . Over an exchange length wire, the pigtail catheter was exchanged for a 105/12 cm multi side-hole EKOS ultrasound assisted infusion catheter. A postprocedural fluoroscopic image was obtained of the check demonstrating final catheter positioning. Both vascular sheath were secured at the right neck with interrupted 0 Prolene suture. The external catheter tubing was secured at the right chest and the lytic therapy was initiated. The patient tolerated the procedure well without immediate postprocedural complication. FINDINGS: Directly recorded proximal right pulmonary artery pressure measurements: 46/10 (23) mmHg (normal: < 25/10) Following the procedure, both ultrasound assisted infusion catheter tips terminate within the distal aspects of the bilateral lower lobe sub segmental pulmonary arteries. IMPRESSION: 1. Successful fluoroscopic guided initiation of bilateral ultrasound assisted catheter directed pulmonary arterial lysis for sub massive pulmonary embolism and right-sided heart strain. 2. Markedly elevated pressure measurements within the right main pulmonary artery compatible with critical pulmonary arterial hypertension. PLAN: - The patient will be assessed approximately 12 hours following the initiation of the catheter directed pulmonary arterial lysis for repeat pressure measurements and either removal of the catheters or continuation of the catheter directed  thrombolysis. Electronically Signed   By: Lucrezia Europe M.D.   On: 01/19/2017 12:17   Ir Angiogram Selective Each Additional Vessel  Result Date: 01/19/2017 INDICATION: Acute bilateral pulmonary emboli. Recent new cardiac arrhythmia and progressive right heart strain. EXAM: 1. ULTRASOUND GUIDANCE FOR VENOUS ACCESS X2 2. DIRECT PULMONARY ARTERY PRESSURE MEASUREMENTS 3. FLUOROSCOPIC GUIDED PLACEMENT OF BILATERAL PULMONARY ARTERIAL LYTIC INFUSION CATHETERS COMPARISON:  Chest CTA - 01/15/2017 MEDICATIONS: Intravenous Fentanyl and Versed were administered as conscious sedation during continuous monitoring of the patient's level of consciousness and physiological / cardiorespiratory status by the radiology RN, with a total moderate sedation time of 3minutes. CONTRAST:  None used FLUOROSCOPY TIME:  6.9 min  (82  uGym2 DAP) COMPLICATIONS: None immediate TECHNIQUE: Informed written consent was obtained from the patient after a discussion of the risks, benefits and alternatives to treatment. Questions regarding the procedure were encouraged and answered. A timeout was performed prior to the initiation of the procedure. Previous ultrasound had demonstrated right lower extremity DVT. Ultrasound scanning was performed of the right IJ vein demonstrating widely patent. As such, the right internal jugular vein was selected for vascular access. The right neck was prepped and draped in the usual sterile fashion, and a sterile drape was applied covering the operative field. Maximum barrier sterile technique with sterile gowns and gloves were used for the procedure. A timeout was performed prior to the initiation of the procedure. Local anesthesia was provided with 1% lidocaine. Under direct ultrasound guidance, the right internal jugular vein was accessed with a micro puncture sheath ultimately  allowing placement of a 6 French vascular sheath. With the use of a glidewire, an angled pigtail catheter was advanced into the right main  pulmonary artery and Pressure measurements were then obtained from the right pulmonary artery. Over a wire, the pigtail catheter was exchanged for a 105/18 cm multi side-hole EKOS ultrasound assisted infusion catheter. Slightly cranial to this initial access, the right internal jugular vein was again accessed with a micropuncture sheath ultimately allowing placement of a 6 French vascular sheath. With the use of a glidewire, a pigtail catheter was advanced into the left main pulmonary artery . Over an exchange length wire, the pigtail catheter was exchanged for a 105/12 cm multi side-hole EKOS ultrasound assisted infusion catheter. A postprocedural fluoroscopic image was obtained of the check demonstrating final catheter positioning. Both vascular sheath were secured at the right neck with interrupted 0 Prolene suture. The external catheter tubing was secured at the right chest and the lytic therapy was initiated. The patient tolerated the procedure well without immediate postprocedural complication. FINDINGS: Directly recorded proximal right pulmonary artery pressure measurements: 46/10 (23) mmHg (normal: < 25/10) Following the procedure, both ultrasound assisted infusion catheter tips terminate within the distal aspects of the bilateral lower lobe sub segmental pulmonary arteries. IMPRESSION: 1. Successful fluoroscopic guided initiation of bilateral ultrasound assisted catheter directed pulmonary arterial lysis for sub massive pulmonary embolism and right-sided heart strain. 2. Markedly elevated pressure measurements within the right main pulmonary artery compatible with critical pulmonary arterial hypertension. PLAN: - The patient will be assessed approximately 12 hours following the initiation of the catheter directed pulmonary arterial lysis for repeat pressure measurements and either removal of the catheters or continuation of the catheter directed thrombolysis. Electronically Signed   By: Lucrezia Europe M.D.    On: 01/19/2017 12:17   Ir Angiogram Selective Each Additional Vessel  Result Date: 01/19/2017 INDICATION: Acute bilateral pulmonary emboli. Recent new cardiac arrhythmia and progressive right heart strain. EXAM: 1. ULTRASOUND GUIDANCE FOR VENOUS ACCESS X2 2. DIRECT PULMONARY ARTERY PRESSURE MEASUREMENTS 3. FLUOROSCOPIC GUIDED PLACEMENT OF BILATERAL PULMONARY ARTERIAL LYTIC INFUSION CATHETERS COMPARISON:  Chest CTA - 01/15/2017 MEDICATIONS: Intravenous Fentanyl and Versed were administered as conscious sedation during continuous monitoring of the patient's level of consciousness and physiological / cardiorespiratory status by the radiology RN, with a total moderate sedation time of 66minutes. CONTRAST:  None used FLUOROSCOPY TIME:  6.9 min  (82  uGym2 DAP) COMPLICATIONS: None immediate TECHNIQUE: Informed written consent was obtained from the patient after a discussion of the risks, benefits and alternatives to treatment. Questions regarding the procedure were encouraged and answered. A timeout was performed prior to the initiation of the procedure. Previous ultrasound had demonstrated right lower extremity DVT. Ultrasound scanning was performed of the right IJ vein demonstrating widely patent. As such, the right internal jugular vein was selected for vascular access. The right neck was prepped and draped in the usual sterile fashion, and a sterile drape was applied covering the operative field. Maximum barrier sterile technique with sterile gowns and gloves were used for the procedure. A timeout was performed prior to the initiation of the procedure. Local anesthesia was provided with 1% lidocaine. Under direct ultrasound guidance, the right internal jugular vein was accessed with a micro puncture sheath ultimately allowing placement of a 6 French vascular sheath. With the use of a glidewire, an angled pigtail catheter was advanced into the right main pulmonary artery and Pressure measurements were then obtained  from the right pulmonary artery. Over a wire,  the pigtail catheter was exchanged for a 105/18 cm multi side-hole EKOS ultrasound assisted infusion catheter. Slightly cranial to this initial access, the right internal jugular vein was again accessed with a micropuncture sheath ultimately allowing placement of a 6 French vascular sheath. With the use of a glidewire, a pigtail catheter was advanced into the left main pulmonary artery . Over an exchange length wire, the pigtail catheter was exchanged for a 105/12 cm multi side-hole EKOS ultrasound assisted infusion catheter. A postprocedural fluoroscopic image was obtained of the check demonstrating final catheter positioning. Both vascular sheath were secured at the right neck with interrupted 0 Prolene suture. The external catheter tubing was secured at the right chest and the lytic therapy was initiated. The patient tolerated the procedure well without immediate postprocedural complication. FINDINGS: Directly recorded proximal right pulmonary artery pressure measurements: 46/10 (23) mmHg (normal: < 25/10) Following the procedure, both ultrasound assisted infusion catheter tips terminate within the distal aspects of the bilateral lower lobe sub segmental pulmonary arteries. IMPRESSION: 1. Successful fluoroscopic guided initiation of bilateral ultrasound assisted catheter directed pulmonary arterial lysis for sub massive pulmonary embolism and right-sided heart strain. 2. Markedly elevated pressure measurements within the right main pulmonary artery compatible with critical pulmonary arterial hypertension. PLAN: - The patient will be assessed approximately 12 hours following the initiation of the catheter directed pulmonary arterial lysis for repeat pressure measurements and either removal of the catheters or continuation of the catheter directed thrombolysis. Electronically Signed   By: Lucrezia Europe M.D.   On: 01/19/2017 12:17   Ir US Guide Vasc Access Right  Result  Date: 01/19/2017 INDICATION: Acute bilateral pulmonary emboli. Recent new cardiac arrhythmia and progressive right heart strain. EXAM: 1. ULTRASOUND GUIDANCE FOR VENOUS ACCESS X2 2. DIRECT PULMONARY ARTERY PRESSURE MEASUREMENTS 3. FLUOROSCOPIC GUIDED PLACEMENT OF BILATERAL PULMONARY ARTERIAL LYTIC INFUSION CATHETERS COMPARISON:  Chest CTA - 01/15/2017 MEDICATIONS: Intravenous Fentanyl and Versed were administered as conscious sedation during continuous monitoring of the patient's level of consciousness and physiological / cardiorespiratory status by the radiology RN, with a total moderate sedation time of 62minutes. CONTRAST:  None used FLUOROSCOPY TIME:  6.9 min  (82  uGym2 DAP) COMPLICATIONS: None immediate TECHNIQUE: Informed written consent was obtained from the patient after a discussion of the risks, benefits and alternatives to treatment. Questions regarding the procedure were encouraged and answered. A timeout was performed prior to the initiation of the procedure. Previous ultrasound had demonstrated right lower extremity DVT. Ultrasound scanning was performed of the right IJ vein demonstrating widely patent. As such, the right internal jugular vein was selected for vascular access. The right neck was prepped and draped in the usual sterile fashion, and a sterile drape was applied covering the operative field. Maximum barrier sterile technique with sterile gowns and gloves were used for the procedure. A timeout was performed prior to the initiation of the procedure. Local anesthesia was provided with 1% lidocaine. Under direct ultrasound guidance, the right internal jugular vein was accessed with a micro puncture sheath ultimately allowing placement of a 6 French vascular sheath. With the use of a glidewire, an angled pigtail catheter was advanced into the right main pulmonary artery and Pressure measurements were then obtained from the right pulmonary artery. Over a wire, the pigtail catheter was exchanged  for a 105/18 cm multi side-hole EKOS ultrasound assisted infusion catheter. Slightly cranial to this initial access, the right internal jugular vein was again accessed with a micropuncture sheath ultimately allowing placement of a 6 [place]  vascular sheath. With the use of a glidewire, a pigtail catheter was advanced into the left main pulmonary artery . Over an exchange length wire, the pigtail catheter was exchanged for a 105/12 cm multi side-hole EKOS ultrasound assisted infusion catheter. A postprocedural fluoroscopic image was obtained of the check demonstrating final catheter positioning. Both vascular sheath were secured at the right neck with interrupted 0 Prolene suture. The external catheter tubing was secured at the right chest and the lytic therapy was initiated. The patient tolerated the procedure well without immediate postprocedural complication. FINDINGS: Directly recorded proximal right pulmonary artery pressure measurements: 46/10 (23) mmHg (normal: < 25/10) Following the procedure, both ultrasound assisted infusion catheter tips terminate within the distal aspects of the bilateral lower lobe sub segmental pulmonary arteries. IMPRESSION: 1. Successful fluoroscopic guided initiation of bilateral ultrasound assisted catheter directed pulmonary arterial lysis for sub massive pulmonary embolism and right-sided heart strain. 2. Markedly elevated pressure measurements within the right main pulmonary artery compatible with critical pulmonary arterial hypertension. PLAN: - The patient will be assessed approximately 12 hours following the initiation of the catheter directed pulmonary arterial lysis for repeat pressure measurements and either removal of the catheters or continuation of the catheter directed thrombolysis. Electronically Signed   By: Lucrezia Europe M.D.   On: 01/19/2017 12:17   Dg Chest Port 1 View  Result Date: 01/20/2017 CLINICAL DATA:  Respiratory failure. EXAM: PORTABLE CHEST 1 VIEW  COMPARISON:  01/19/2017. FINDINGS: Interim removal of pulmonary infusion catheters. Stable cardiomegaly. Low lung volumes with basilar subsegmental atelectasis. Small right pleural effusion cannot be excluded. No pneumothorax. Postsurgical changes right shoulder. IMPRESSION: 1. Interim removal of pulmonary infusion catheters. 2. Stable cardiomegaly. 3. Low lung volumes with mild basilar atelectasis . Small right pleural effusion cannot be excluded. Electronically Signed   By: Marcello Moores  Register   On: 01/20/2017 07:05   Dg Chest Port 1 View  Result Date: 01/19/2017 CLINICAL DATA:  82 year old female with acute saddle embolus. Postoperative day 1 status post catheter directed ultrasound-assisted bilateral pulmonary arterial thrombolysis initiated via R IJ x2. Hemoptysis EXAM: PORTABLE CHEST 1 VIEW COMPARISON:  01/15/2017 portable chest and earlier. FINDINGS: Portable AP semi upright view at 0947 hours. Right IJ approach pulmonary artery infusion catheters are in place. Left chest pacer or resuscitation pads are in place. Stable lung volumes. Stable cardiac size and mediastinal contours. Allowing for portable technique the lungs are clear. IMPRESSION: Right IJ approach pulmonary artery infusion catheters in place, otherwise stable and negative portable radiographic appearance of the chest. Electronically Signed   By: Genevie Ann M.D.   On: 01/19/2017 10:07   Ir Infusion Thrombol Arterial Initial (ms)  Result Date: 01/19/2017 INDICATION: Acute bilateral pulmonary emboli. Recent new cardiac arrhythmia and progressive right heart strain. EXAM: 1. ULTRASOUND GUIDANCE FOR VENOUS ACCESS X2 2. DIRECT PULMONARY ARTERY PRESSURE MEASUREMENTS 3. FLUOROSCOPIC GUIDED PLACEMENT OF BILATERAL PULMONARY ARTERIAL LYTIC INFUSION CATHETERS COMPARISON:  Chest CTA - 01/15/2017 MEDICATIONS: Intravenous Fentanyl and Versed were administered as conscious sedation during continuous monitoring of the patient's level of consciousness and  physiological / cardiorespiratory status by the radiology RN, with a total moderate sedation time of 59minutes. CONTRAST:  None used FLUOROSCOPY TIME:  6.9 min  (82  uGym2 DAP) COMPLICATIONS: None immediate TECHNIQUE: Informed written consent was obtained from the patient after a discussion of the risks, benefits and alternatives to treatment. Questions regarding the procedure were encouraged and answered. A timeout was performed prior to the initiation of the procedure. Previous ultrasound had  demonstrated right lower extremity DVT. Ultrasound scanning was performed of the right IJ vein demonstrating widely patent. As such, the right internal jugular vein was selected for vascular access. The right neck was prepped and draped in the usual sterile fashion, and a sterile drape was applied covering the operative field. Maximum barrier sterile technique with sterile gowns and gloves were used for the procedure. A timeout was performed prior to the initiation of the procedure. Local anesthesia was provided with 1% lidocaine. Under direct ultrasound guidance, the right internal jugular vein was accessed with a micro puncture sheath ultimately allowing placement of a 6 French vascular sheath. With the use of a glidewire, an angled pigtail catheter was advanced into the right main pulmonary artery and Pressure measurements were then obtained from the right pulmonary artery. Over a wire, the pigtail catheter was exchanged for a 105/18 cm multi side-hole EKOS ultrasound assisted infusion catheter. Slightly cranial to this initial access, the right internal jugular vein was again accessed with a micropuncture sheath ultimately allowing placement of a 6 French vascular sheath. With the use of a glidewire, a pigtail catheter was advanced into the left main pulmonary artery . Over an exchange length wire, the pigtail catheter was exchanged for a 105/12 cm multi side-hole EKOS ultrasound assisted infusion catheter. A  postprocedural fluoroscopic image was obtained of the check demonstrating final catheter positioning. Both vascular sheath were secured at the right neck with interrupted 0 Prolene suture. The external catheter tubing was secured at the right chest and the lytic therapy was initiated. The patient tolerated the procedure well without immediate postprocedural complication. FINDINGS: Directly recorded proximal right pulmonary artery pressure measurements: 46/10 (23) mmHg (normal: < 25/10) Following the procedure, both ultrasound assisted infusion catheter tips terminate within the distal aspects of the bilateral lower lobe sub segmental pulmonary arteries. IMPRESSION: 1. Successful fluoroscopic guided initiation of bilateral ultrasound assisted catheter directed pulmonary arterial lysis for sub massive pulmonary embolism and right-sided heart strain. 2. Markedly elevated pressure measurements within the right main pulmonary artery compatible with critical pulmonary arterial hypertension. PLAN: - The patient will be assessed approximately 12 hours following the initiation of the catheter directed pulmonary arterial lysis for repeat pressure measurements and either removal of the catheters or continuation of the catheter directed thrombolysis. Electronically Signed   By: Lucrezia Europe M.D.   On: 01/19/2017 12:17   Ir Infusion Thrombol Arterial Initial (ms)  Result Date: 01/19/2017 INDICATION: Acute bilateral pulmonary emboli. Recent new cardiac arrhythmia and progressive right heart strain. EXAM: 1. ULTRASOUND GUIDANCE FOR VENOUS ACCESS X2 2. DIRECT PULMONARY ARTERY PRESSURE MEASUREMENTS 3. FLUOROSCOPIC GUIDED PLACEMENT OF BILATERAL PULMONARY ARTERIAL LYTIC INFUSION CATHETERS COMPARISON:  Chest CTA - 01/15/2017 MEDICATIONS: Intravenous Fentanyl and Versed were administered as conscious sedation during continuous monitoring of the patient's level of consciousness and physiological / cardiorespiratory status by the  radiology RN, with a total moderate sedation time of 53minutes. CONTRAST:  None used FLUOROSCOPY TIME:  6.9 min  (82  uGym2 DAP) COMPLICATIONS: None immediate TECHNIQUE: Informed written consent was obtained from the patient after a discussion of the risks, benefits and alternatives to treatment. Questions regarding the procedure were encouraged and answered. A timeout was performed prior to the initiation of the procedure. Previous ultrasound had demonstrated right lower extremity DVT. Ultrasound scanning was performed of the right IJ vein demonstrating widely patent. As such, the right internal jugular vein was selected for vascular access. The right neck was prepped and draped in the usual sterile fashion,  and a sterile drape was applied covering the operative field. Maximum barrier sterile technique with sterile gowns and gloves were used for the procedure. A timeout was performed prior to the initiation of the procedure. Local anesthesia was provided with 1% lidocaine. Under direct ultrasound guidance, the right internal jugular vein was accessed with a micro puncture sheath ultimately allowing placement of a 6 French vascular sheath. With the use of a glidewire, an angled pigtail catheter was advanced into the right main pulmonary artery and Pressure measurements were then obtained from the right pulmonary artery. Over a wire, the pigtail catheter was exchanged for a 105/18 cm multi side-hole EKOS ultrasound assisted infusion catheter. Slightly cranial to this initial access, the right internal jugular vein was again accessed with a micropuncture sheath ultimately allowing placement of a 6 French vascular sheath. With the use of a glidewire, a pigtail catheter was advanced into the left main pulmonary artery . Over an exchange length wire, the pigtail catheter was exchanged for a 105/12 cm multi side-hole EKOS ultrasound assisted infusion catheter. A postprocedural fluoroscopic image was obtained of the check  demonstrating final catheter positioning. Both vascular sheath were secured at the right neck with interrupted 0 Prolene suture. The external catheter tubing was secured at the right chest and the lytic therapy was initiated. The patient tolerated the procedure well without immediate postprocedural complication. FINDINGS: Directly recorded proximal right pulmonary artery pressure measurements: 46/10 (23) mmHg (normal: < 25/10) Following the procedure, both ultrasound assisted infusion catheter tips terminate within the distal aspects of the bilateral lower lobe sub segmental pulmonary arteries. IMPRESSION: 1. Successful fluoroscopic guided initiation of bilateral ultrasound assisted catheter directed pulmonary arterial lysis for sub massive pulmonary embolism and right-sided heart strain. 2. Markedly elevated pressure measurements within the right main pulmonary artery compatible with critical pulmonary arterial hypertension. PLAN: - The patient will be assessed approximately 12 hours following the initiation of the catheter directed pulmonary arterial lysis for repeat pressure measurements and either removal of the catheters or continuation of the catheter directed thrombolysis. Electronically Signed   By: Lucrezia Europe M.D.   On: 01/19/2017 12:17   Ir Jacolyn Reedy F/u Eval Art/ven Final Day (ms)  Result Date: 01/19/2017 CLINICAL DATA:  Sub massive pulmonary emboli, status post 12+ hour ultrasound assisted catheter directed bilateral pulmonary arterial thrombolytic infusion, complicated only by Mild sporadic hemoptysis. EXAM: IR THROMB F/U EVAL ART/VEN FINAL DAY ANESTHESIA/SEDATION: None used MEDICATIONS: None CONTRAST:  None PROCEDURE: Stable catheter position confirmed on preprocedure chest radiography. Repeat pulmonary arterial pressure measurements obtained bedside . The catheters and sheaths were removed and hemostasis achieved with manual compression. The patient tolerated the procedure well. COMPLICATIONS: None  immediate FINDINGS: Postprocedure pulmonary artery pressure 41/15 (25) mmHg. IMPRESSION: 1. Technically successful catheter directed bilateral pulmonary artery ultrasound assisted thrombolytic infusion. Electronically Signed   By: Lucrezia Europe M.D.   On: 01/19/2017 12:04    Labs:  CBC:  Recent Labs  01/18/17 2233 01/19/17 0549 01/19/17 1245 01/20/17 0431  WBC 10.1 8.4 7.7 6.1  HGB 12.4 11.9* 11.5* 10.7*  HCT 38.2 37.9 36.3 33.6*  PLT 90* 75* 77* 72*    COAGS:  Recent Labs  01/16/17 0003  INR 1.20    BMP:  Recent Labs  01/16/17 0545 01/17/17 0529 01/18/17 0356 01/20/17 0431  NA 139 138 135 132*  K 4.0 3.9 4.4 3.9  CL 109 107 102 103  CO2 19* 20* 23 23  GLUCOSE 139* 120* 163* 140*  BUN 28* 23*  26* 27*  CALCIUM 7.8* 8.6* 8.9 8.2*  CREATININE 1.35* 1.13* 1.28* 1.06*  GFRNONAA 36* 45* 38* 48*  GFRAA 42* 52* 45* 56*    LIVER FUNCTION TESTS:  Recent Labs  01/15/17 2338  BILITOT 1.0  AST 94*  ALT 58*  ALKPHOS 138*  PROT 5.7*  ALBUMIN 2.7*    Assessment and Plan: 1. S/p PE lysis -patient is doing well and actually improved -Site is clean dry and intact.   -no further IR intervention at this time.  Electronically Signed: Henreitta Cea 01/20/2017, 2:23 PM   I spent a total of 15 Minutes at the the patient's bedside AND on the patient's hospital floor or unit, greater than 50% of which was counseling/coordinating care for pulmonary embolus

## 2017-01-20 NOTE — Progress Notes (Signed)
Progress Note  Patient Name: Jennifer Medina Date of Encounter: 01/20/2017  Primary Cardiologist: Reola Calkins [place] [facility])  Subjective   Occasional pleurisy, mild hemoptysis, breathing much better. Monitor shows NSR/mild sinus bradycardia with occasional PVCs, no new VT, AFib or pauses.  Inpatient Medications    Scheduled Meds: . atorvastatin  40 mg Oral QHS  . chlorhexidine  15 mL Mouth Rinse BID  . diltiazem  10 mg Intravenous Once  . latanoprost  1 drop Both Eyes QHS  . mouth rinse  15 mL Mouth Rinse q12n4p  . pantoprazole  40 mg Oral Daily  . sodium chloride flush  3 mL Intravenous Q12H   Continuous Infusions: . sodium chloride Stopped (01/19/17 1152)  . sodium chloride Stopped (01/19/17 1152)  . sodium chloride    . sodium chloride    . amiodarone Stopped (01/20/17 0320)  . heparin 1,050 Units/hr (01/19/17 2259)   PRN Meds: sodium chloride, acetaminophen, LORazepam, [DISCONTINUED] ondansetron **OR** ondansetron (ZOFRAN) IV, sodium chloride flush, traMADol   Vital Signs    Vitals:   01/20/17 0600 01/20/17 0700 01/20/17 0750 01/20/17 0800  BP: (!) 93/50 (!) 89/51  (!) 106/91  Pulse: (!) 51 (!) 52  (!) 54  Resp: 14 14  19   Temp:   97.5 F (36.4 C)   TempSrc:   Oral   SpO2: 98% 99%  99%  Weight:      Height:        Intake/Output Summary (Last 24 hours) at 01/20/17 0950 Last data filed at 01/20/17 0800  Gross per 24 hour  Intake           1319.5 ml  Output              560 ml  Net            759.5 ml   Filed Weights   01/16/17 0200 01/18/17 0447 01/18/17 1815  Weight: 106.9 kg (235 lb 10.8 oz) 110.5 kg (243 lb 9.6 oz) 113.3 kg (249 lb 12.5 oz)    Telemetry    NSR/mild sinus bradycardia with occasional PVCs, no new VT, AFib or pauses. - Personally Reviewed  ECG    atrial fibrillation, RBBB - Personally Reviewed  Physical Exam  Comfortable at rest GEN: No acute distress.   Neck: No JVD Cardiac: RRR, no murmurs, rubs, or gallops.  Respiratory: Clear to  auscultation bilaterally. GI: Soft, nontender, non-distended  MS: 1+ R calf edema edema; No deformity. Neuro:  Nonfocal  Psych: Normal affect   Labs    Chemistry Recent Labs Lab 01/15/17 2338  01/17/17 0529 01/18/17 0356 01/20/17 0431  NA 138  < > 138 135 132*  K 4.1  < > 3.9 4.4 3.9  CL 102  < > 107 102 103  CO2 19*  < > 20* 23 23  GLUCOSE 127*  < > 120* 163* 140*  BUN 29*  < > 23* 26* 27*  CREATININE 1.40*  < > 1.13* 1.28* 1.06*  CALCIUM 8.3*  < > 8.6* 8.9 8.2*  PROT 5.7*  --   --   --   --   ALBUMIN 2.7*  --   --   --   --   AST 94*  --   --   --   --   ALT 58*  --   --   --   --   ALKPHOS 138*  --   --   --   --   BILITOT 1.0  --   --   --   --  GFRNONAA 34*  < > 45* 38* 48*  GFRAA 40*  < > 52* 45* 56*  ANIONGAP 17*  < > 11 10 6   < > = values in this interval not displayed.   Hematology Recent Labs Lab 01/19/17 0549 01/19/17 1245 01/20/17 0431  WBC 8.4 7.7 6.1  RBC 4.05 3.88 3.63*  HGB 11.9* 11.5* 10.7*  HCT 37.9 36.3 33.6*  MCV 93.6 93.6 92.6  MCH 29.4 29.6 29.5  MCHC 31.4 31.7 31.8  RDW 15.7* 15.8* 15.8*  PLT 75* 77* 72*    Cardiac Enzymes Recent Labs Lab 01/15/17 2338 01/16/17 0545 01/16/17 1224  TROPONINI 0.38* 0.30* 0.23*   No results for input(s): TROPIPOC in the last 168 hours.   BNP Recent Labs Lab 01/15/17 2338  BNP 841.8*     DDimer No results for input(s): DDIMER in the last 168 hours.   Radiology    Ir Angiogram Pulmonary Bilateral Selective  Result Date: 01/19/2017 INDICATION: Acute bilateral pulmonary emboli. Recent new cardiac arrhythmia and progressive right heart strain. EXAM: 1. ULTRASOUND GUIDANCE FOR VENOUS ACCESS X2 2. DIRECT PULMONARY ARTERY PRESSURE MEASUREMENTS 3. FLUOROSCOPIC GUIDED PLACEMENT OF BILATERAL PULMONARY ARTERIAL LYTIC INFUSION CATHETERS COMPARISON:  Chest CTA - 01/15/2017 MEDICATIONS: Intravenous Fentanyl and Versed were administered as conscious sedation during continuous monitoring of the patient's  level of consciousness and physiological / cardiorespiratory status by the radiology RN, with a total moderate sedation time of 36minutes. CONTRAST:  None used FLUOROSCOPY TIME:  6.9 min  (82  uGym2 DAP) COMPLICATIONS: None immediate TECHNIQUE: Informed written consent was obtained from the patient after a discussion of the risks, benefits and alternatives to treatment. Questions regarding the procedure were encouraged and answered. A timeout was performed prior to the initiation of the procedure. Previous ultrasound had demonstrated right lower extremity DVT. Ultrasound scanning was performed of the right IJ vein demonstrating widely patent. As such, the right internal jugular vein was selected for vascular access. The right neck was prepped and draped in the usual sterile fashion, and a sterile drape was applied covering the operative field. Maximum barrier sterile technique with sterile gowns and gloves were used for the procedure. A timeout was performed prior to the initiation of the procedure. Local anesthesia was provided with 1% lidocaine. Under direct ultrasound guidance, the right internal jugular vein was accessed with a micro puncture sheath ultimately allowing placement of a 6 French vascular sheath. With the use of a glidewire, an angled pigtail catheter was advanced into the right main pulmonary artery and Pressure measurements were then obtained from the right pulmonary artery. Over a wire, the pigtail catheter was exchanged for a 105/18 cm multi side-hole EKOS ultrasound assisted infusion catheter. Slightly cranial to this initial access, the right internal jugular vein was again accessed with a micropuncture sheath ultimately allowing placement of a 6 French vascular sheath. With the use of a glidewire, a pigtail catheter was advanced into the left main pulmonary artery . Over an exchange length wire, the pigtail catheter was exchanged for a 105/12 cm multi side-hole EKOS ultrasound assisted  infusion catheter. A postprocedural fluoroscopic image was obtained of the check demonstrating final catheter positioning. Both vascular sheath were secured at the right neck with interrupted 0 Prolene suture. The external catheter tubing was secured at the right chest and the lytic therapy was initiated. The patient tolerated the procedure well without immediate postprocedural complication. FINDINGS: Directly recorded proximal right pulmonary artery pressure measurements: 46/10 (23) mmHg (normal: < 25/10) Following the procedure,  both ultrasound assisted infusion catheter tips terminate within the distal aspects of the bilateral lower lobe sub segmental pulmonary arteries. IMPRESSION: 1. Successful fluoroscopic guided initiation of bilateral ultrasound assisted catheter directed pulmonary arterial lysis for sub massive pulmonary embolism and right-sided heart strain. 2. Markedly elevated pressure measurements within the right main pulmonary artery compatible with critical pulmonary arterial hypertension. PLAN: - The patient will be assessed approximately 12 hours following the initiation of the catheter directed pulmonary arterial lysis for repeat pressure measurements and either removal of the catheters or continuation of the catheter directed thrombolysis. Electronically Signed   By: Lucrezia Europe M.D.   On: 01/19/2017 12:17   Ir Angiogram Selective Each Additional Vessel  Result Date: 01/19/2017 INDICATION: Acute bilateral pulmonary emboli. Recent new cardiac arrhythmia and progressive right heart strain. EXAM: 1. ULTRASOUND GUIDANCE FOR VENOUS ACCESS X2 2. DIRECT PULMONARY ARTERY PRESSURE MEASUREMENTS 3. FLUOROSCOPIC GUIDED PLACEMENT OF BILATERAL PULMONARY ARTERIAL LYTIC INFUSION CATHETERS COMPARISON:  Chest CTA - 01/15/2017 MEDICATIONS: Intravenous Fentanyl and Versed were administered as conscious sedation during continuous monitoring of the patient's level of consciousness and physiological /  cardiorespiratory status by the radiology RN, with a total moderate sedation time of 98minutes. CONTRAST:  None used FLUOROSCOPY TIME:  6.9 min  (82  uGym2 DAP) COMPLICATIONS: None immediate TECHNIQUE: Informed written consent was obtained from the patient after a discussion of the risks, benefits and alternatives to treatment. Questions regarding the procedure were encouraged and answered. A timeout was performed prior to the initiation of the procedure. Previous ultrasound had demonstrated right lower extremity DVT. Ultrasound scanning was performed of the right IJ vein demonstrating widely patent. As such, the right internal jugular vein was selected for vascular access. The right neck was prepped and draped in the usual sterile fashion, and a sterile drape was applied covering the operative field. Maximum barrier sterile technique with sterile gowns and gloves were used for the procedure. A timeout was performed prior to the initiation of the procedure. Local anesthesia was provided with 1% lidocaine. Under direct ultrasound guidance, the right internal jugular vein was accessed with a micro puncture sheath ultimately allowing placement of a 6 French vascular sheath. With the use of a glidewire, an angled pigtail catheter was advanced into the right main pulmonary artery and Pressure measurements were then obtained from the right pulmonary artery. Over a wire, the pigtail catheter was exchanged for a 105/18 cm multi side-hole EKOS ultrasound assisted infusion catheter. Slightly cranial to this initial access, the right internal jugular vein was again accessed with a micropuncture sheath ultimately allowing placement of a 6 French vascular sheath. With the use of a glidewire, a pigtail catheter was advanced into the left main pulmonary artery . Over an exchange length wire, the pigtail catheter was exchanged for a 105/12 cm multi side-hole EKOS ultrasound assisted infusion catheter. A postprocedural fluoroscopic  image was obtained of the check demonstrating final catheter positioning. Both vascular sheath were secured at the right neck with interrupted 0 Prolene suture. The external catheter tubing was secured at the right chest and the lytic therapy was initiated. The patient tolerated the procedure well without immediate postprocedural complication. FINDINGS: Directly recorded proximal right pulmonary artery pressure measurements: 46/10 (23) mmHg (normal: < 25/10) Following the procedure, both ultrasound assisted infusion catheter tips terminate within the distal aspects of the bilateral lower lobe sub segmental pulmonary arteries. IMPRESSION: 1. Successful fluoroscopic guided initiation of bilateral ultrasound assisted catheter directed pulmonary arterial lysis for sub massive pulmonary embolism and  right-sided heart strain. 2. Markedly elevated pressure measurements within the right main pulmonary artery compatible with critical pulmonary arterial hypertension. PLAN: - The patient will be assessed approximately 12 hours following the initiation of the catheter directed pulmonary arterial lysis for repeat pressure measurements and either removal of the catheters or continuation of the catheter directed thrombolysis. Electronically Signed   By: Lucrezia Europe M.D.   On: 01/19/2017 12:17   Ir Angiogram Selective Each Additional Vessel  Result Date: 01/19/2017 INDICATION: Acute bilateral pulmonary emboli. Recent new cardiac arrhythmia and progressive right heart strain. EXAM: 1. ULTRASOUND GUIDANCE FOR VENOUS ACCESS X2 2. DIRECT PULMONARY ARTERY PRESSURE MEASUREMENTS 3. FLUOROSCOPIC GUIDED PLACEMENT OF BILATERAL PULMONARY ARTERIAL LYTIC INFUSION CATHETERS COMPARISON:  Chest CTA - 01/15/2017 MEDICATIONS: Intravenous Fentanyl and Versed were administered as conscious sedation during continuous monitoring of the patient's level of consciousness and physiological / cardiorespiratory status by the radiology RN, with a total  moderate sedation time of 78minutes. CONTRAST:  None used FLUOROSCOPY TIME:  6.9 min  (82  uGym2 DAP) COMPLICATIONS: None immediate TECHNIQUE: Informed written consent was obtained from the patient after a discussion of the risks, benefits and alternatives to treatment. Questions regarding the procedure were encouraged and answered. A timeout was performed prior to the initiation of the procedure. Previous ultrasound had demonstrated right lower extremity DVT. Ultrasound scanning was performed of the right IJ vein demonstrating widely patent. As such, the right internal jugular vein was selected for vascular access. The right neck was prepped and draped in the usual sterile fashion, and a sterile drape was applied covering the operative field. Maximum barrier sterile technique with sterile gowns and gloves were used for the procedure. A timeout was performed prior to the initiation of the procedure. Local anesthesia was provided with 1% lidocaine. Under direct ultrasound guidance, the right internal jugular vein was accessed with a micro puncture sheath ultimately allowing placement of a 6 French vascular sheath. With the use of a glidewire, an angled pigtail catheter was advanced into the right main pulmonary artery and Pressure measurements were then obtained from the right pulmonary artery. Over a wire, the pigtail catheter was exchanged for a 105/18 cm multi side-hole EKOS ultrasound assisted infusion catheter. Slightly cranial to this initial access, the right internal jugular vein was again accessed with a micropuncture sheath ultimately allowing placement of a 6 French vascular sheath. With the use of a glidewire, a pigtail catheter was advanced into the left main pulmonary artery . Over an exchange length wire, the pigtail catheter was exchanged for a 105/12 cm multi side-hole EKOS ultrasound assisted infusion catheter. A postprocedural fluoroscopic image was obtained of the check demonstrating final  catheter positioning. Both vascular sheath were secured at the right neck with interrupted 0 Prolene suture. The external catheter tubing was secured at the right chest and the lytic therapy was initiated. The patient tolerated the procedure well without immediate postprocedural complication. FINDINGS: Directly recorded proximal right pulmonary artery pressure measurements: 46/10 (23) mmHg (normal: < 25/10) Following the procedure, both ultrasound assisted infusion catheter tips terminate within the distal aspects of the bilateral lower lobe sub segmental pulmonary arteries. IMPRESSION: 1. Successful fluoroscopic guided initiation of bilateral ultrasound assisted catheter directed pulmonary arterial lysis for sub massive pulmonary embolism and right-sided heart strain. 2. Markedly elevated pressure measurements within the right main pulmonary artery compatible with critical pulmonary arterial hypertension. PLAN: - The patient will be assessed approximately 12 hours following the initiation of the catheter directed pulmonary arterial lysis for  repeat pressure measurements and either removal of the catheters or continuation of the catheter directed thrombolysis. Electronically Signed   By: Lucrezia Europe M.D.   On: 01/19/2017 12:17   Ir US Guide Vasc Access Right  Result Date: 01/19/2017 INDICATION: Acute bilateral pulmonary emboli. Recent new cardiac arrhythmia and progressive right heart strain. EXAM: 1. ULTRASOUND GUIDANCE FOR VENOUS ACCESS X2 2. DIRECT PULMONARY ARTERY PRESSURE MEASUREMENTS 3. FLUOROSCOPIC GUIDED PLACEMENT OF BILATERAL PULMONARY ARTERIAL LYTIC INFUSION CATHETERS COMPARISON:  Chest CTA - 01/15/2017 MEDICATIONS: Intravenous Fentanyl and Versed were administered as conscious sedation during continuous monitoring of the patient's level of consciousness and physiological / cardiorespiratory status by the radiology RN, with a total moderate sedation time of 4minutes. CONTRAST:  None used FLUOROSCOPY  TIME:  6.9 min  (82  uGym2 DAP) COMPLICATIONS: None immediate TECHNIQUE: Informed written consent was obtained from the patient after a discussion of the risks, benefits and alternatives to treatment. Questions regarding the procedure were encouraged and answered. A timeout was performed prior to the initiation of the procedure. Previous ultrasound had demonstrated right lower extremity DVT. Ultrasound scanning was performed of the right IJ vein demonstrating widely patent. As such, the right internal jugular vein was selected for vascular access. The right neck was prepped and draped in the usual sterile fashion, and a sterile drape was applied covering the operative field. Maximum barrier sterile technique with sterile gowns and gloves were used for the procedure. A timeout was performed prior to the initiation of the procedure. Local anesthesia was provided with 1% lidocaine. Under direct ultrasound guidance, the right internal jugular vein was accessed with a micro puncture sheath ultimately allowing placement of a 6 French vascular sheath. With the use of a glidewire, an angled pigtail catheter was advanced into the right main pulmonary artery and Pressure measurements were then obtained from the right pulmonary artery. Over a wire, the pigtail catheter was exchanged for a 105/18 cm multi side-hole EKOS ultrasound assisted infusion catheter. Slightly cranial to this initial access, the right internal jugular vein was again accessed with a micropuncture sheath ultimately allowing placement of a 6 French vascular sheath. With the use of a glidewire, a pigtail catheter was advanced into the left main pulmonary artery . Over an exchange length wire, the pigtail catheter was exchanged for a 105/12 cm multi side-hole EKOS ultrasound assisted infusion catheter. A postprocedural fluoroscopic image was obtained of the check demonstrating final catheter positioning. Both vascular sheath were secured at the right neck  with interrupted 0 Prolene suture. The external catheter tubing was secured at the right chest and the lytic therapy was initiated. The patient tolerated the procedure well without immediate postprocedural complication. FINDINGS: Directly recorded proximal right pulmonary artery pressure measurements: 46/10 (23) mmHg (normal: < 25/10) Following the procedure, both ultrasound assisted infusion catheter tips terminate within the distal aspects of the bilateral lower lobe sub segmental pulmonary arteries. IMPRESSION: 1. Successful fluoroscopic guided initiation of bilateral ultrasound assisted catheter directed pulmonary arterial lysis for sub massive pulmonary embolism and right-sided heart strain. 2. Markedly elevated pressure measurements within the right main pulmonary artery compatible with critical pulmonary arterial hypertension. PLAN: - The patient will be assessed approximately 12 hours following the initiation of the catheter directed pulmonary arterial lysis for repeat pressure measurements and either removal of the catheters or continuation of the catheter directed thrombolysis. Electronically Signed   By: Lucrezia Europe M.D.   On: 01/19/2017 12:17   Dg Chest Port 1 View  Result Date: 01/20/2017  CLINICAL DATA:  Respiratory failure. EXAM: PORTABLE CHEST 1 VIEW COMPARISON:  01/19/2017. FINDINGS: Interim removal of pulmonary infusion catheters. Stable cardiomegaly. Low lung volumes with basilar subsegmental atelectasis. Small right pleural effusion cannot be excluded. No pneumothorax. Postsurgical changes right shoulder. IMPRESSION: 1. Interim removal of pulmonary infusion catheters. 2. Stable cardiomegaly. 3. Low lung volumes with mild basilar atelectasis . Small right pleural effusion cannot be excluded. Electronically Signed   By: Marcello Moores  Register   On: 01/20/2017 07:05   Dg Chest Port 1 View  Result Date: 01/19/2017 CLINICAL DATA:  82 year old female with acute saddle embolus. Postoperative day 1  status post catheter directed ultrasound-assisted bilateral pulmonary arterial thrombolysis initiated via R IJ x2. Hemoptysis EXAM: PORTABLE CHEST 1 VIEW COMPARISON:  01/15/2017 portable chest and earlier. FINDINGS: Portable AP semi upright view at 0947 hours. Right IJ approach pulmonary artery infusion catheters are in place. Left chest pacer or resuscitation pads are in place. Stable lung volumes. Stable cardiac size and mediastinal contours. Allowing for portable technique the lungs are clear. IMPRESSION: Right IJ approach pulmonary artery infusion catheters in place, otherwise stable and negative portable radiographic appearance of the chest. Electronically Signed   By: Genevie Ann M.D.   On: 01/19/2017 10:07   Ir Infusion Thrombol Arterial Initial (ms)  Result Date: 01/19/2017 INDICATION: Acute bilateral pulmonary emboli. Recent new cardiac arrhythmia and progressive right heart strain. EXAM: 1. ULTRASOUND GUIDANCE FOR VENOUS ACCESS X2 2. DIRECT PULMONARY ARTERY PRESSURE MEASUREMENTS 3. FLUOROSCOPIC GUIDED PLACEMENT OF BILATERAL PULMONARY ARTERIAL LYTIC INFUSION CATHETERS COMPARISON:  Chest CTA - 01/15/2017 MEDICATIONS: Intravenous Fentanyl and Versed were administered as conscious sedation during continuous monitoring of the patient's level of consciousness and physiological / cardiorespiratory status by the radiology RN, with a total moderate sedation time of 78minutes. CONTRAST:  None used FLUOROSCOPY TIME:  6.9 min  (82  uGym2 DAP) COMPLICATIONS: None immediate TECHNIQUE: Informed written consent was obtained from the patient after a discussion of the risks, benefits and alternatives to treatment. Questions regarding the procedure were encouraged and answered. A timeout was performed prior to the initiation of the procedure. Previous ultrasound had demonstrated right lower extremity DVT. Ultrasound scanning was performed of the right IJ vein demonstrating widely patent. As such, the right internal jugular  vein was selected for vascular access. The right neck was prepped and draped in the usual sterile fashion, and a sterile drape was applied covering the operative field. Maximum barrier sterile technique with sterile gowns and gloves were used for the procedure. A timeout was performed prior to the initiation of the procedure. Local anesthesia was provided with 1% lidocaine. Under direct ultrasound guidance, the right internal jugular vein was accessed with a micro puncture sheath ultimately allowing placement of a 6 French vascular sheath. With the use of a glidewire, an angled pigtail catheter was advanced into the right main pulmonary artery and Pressure measurements were then obtained from the right pulmonary artery. Over a wire, the pigtail catheter was exchanged for a 105/18 cm multi side-hole EKOS ultrasound assisted infusion catheter. Slightly cranial to this initial access, the right internal jugular vein was again accessed with a micropuncture sheath ultimately allowing placement of a 6 French vascular sheath. With the use of a glidewire, a pigtail catheter was advanced into the left main pulmonary artery . Over an exchange length wire, the pigtail catheter was exchanged for a 105/12 cm multi side-hole EKOS ultrasound assisted infusion catheter. A postprocedural fluoroscopic image was obtained of the check demonstrating final catheter positioning.  Both vascular sheath were secured at the right neck with interrupted 0 Prolene suture. The external catheter tubing was secured at the right chest and the lytic therapy was initiated. The patient tolerated the procedure well without immediate postprocedural complication. FINDINGS: Directly recorded proximal right pulmonary artery pressure measurements: 46/10 (23) mmHg (normal: < 25/10) Following the procedure, both ultrasound assisted infusion catheter tips terminate within the distal aspects of the bilateral lower lobe sub segmental pulmonary arteries.  IMPRESSION: 1. Successful fluoroscopic guided initiation of bilateral ultrasound assisted catheter directed pulmonary arterial lysis for sub massive pulmonary embolism and right-sided heart strain. 2. Markedly elevated pressure measurements within the right main pulmonary artery compatible with critical pulmonary arterial hypertension. PLAN: - The patient will be assessed approximately 12 hours following the initiation of the catheter directed pulmonary arterial lysis for repeat pressure measurements and either removal of the catheters or continuation of the catheter directed thrombolysis. Electronically Signed   By: Lucrezia Europe M.D.   On: 01/19/2017 12:17   Ir Infusion Thrombol Arterial Initial (ms)  Result Date: 01/19/2017 INDICATION: Acute bilateral pulmonary emboli. Recent new cardiac arrhythmia and progressive right heart strain. EXAM: 1. ULTRASOUND GUIDANCE FOR VENOUS ACCESS X2 2. DIRECT PULMONARY ARTERY PRESSURE MEASUREMENTS 3. FLUOROSCOPIC GUIDED PLACEMENT OF BILATERAL PULMONARY ARTERIAL LYTIC INFUSION CATHETERS COMPARISON:  Chest CTA - 01/15/2017 MEDICATIONS: Intravenous Fentanyl and Versed were administered as conscious sedation during continuous monitoring of the patient's level of consciousness and physiological / cardiorespiratory status by the radiology RN, with a total moderate sedation time of 68minutes. CONTRAST:  None used FLUOROSCOPY TIME:  6.9 min  (82  uGym2 DAP) COMPLICATIONS: None immediate TECHNIQUE: Informed written consent was obtained from the patient after a discussion of the risks, benefits and alternatives to treatment. Questions regarding the procedure were encouraged and answered. A timeout was performed prior to the initiation of the procedure. Previous ultrasound had demonstrated right lower extremity DVT. Ultrasound scanning was performed of the right IJ vein demonstrating widely patent. As such, the right internal jugular vein was selected for vascular access. The right neck  was prepped and draped in the usual sterile fashion, and a sterile drape was applied covering the operative field. Maximum barrier sterile technique with sterile gowns and gloves were used for the procedure. A timeout was performed prior to the initiation of the procedure. Local anesthesia was provided with 1% lidocaine. Under direct ultrasound guidance, the right internal jugular vein was accessed with a micro puncture sheath ultimately allowing placement of a 6 French vascular sheath. With the use of a glidewire, an angled pigtail catheter was advanced into the right main pulmonary artery and Pressure measurements were then obtained from the right pulmonary artery. Over a wire, the pigtail catheter was exchanged for a 105/18 cm multi side-hole EKOS ultrasound assisted infusion catheter. Slightly cranial to this initial access, the right internal jugular vein was again accessed with a micropuncture sheath ultimately allowing placement of a 6 French vascular sheath. With the use of a glidewire, a pigtail catheter was advanced into the left main pulmonary artery . Over an exchange length wire, the pigtail catheter was exchanged for a 105/12 cm multi side-hole EKOS ultrasound assisted infusion catheter. A postprocedural fluoroscopic image was obtained of the check demonstrating final catheter positioning. Both vascular sheath were secured at the right neck with interrupted 0 Prolene suture. The external catheter tubing was secured at the right chest and the lytic therapy was initiated. The patient tolerated the procedure well without immediate postprocedural complication. FINDINGS:  Directly recorded proximal right pulmonary artery pressure measurements: 46/10 (23) mmHg (normal: < 25/10) Following the procedure, both ultrasound assisted infusion catheter tips terminate within the distal aspects of the bilateral lower lobe sub segmental pulmonary arteries. IMPRESSION: 1. Successful fluoroscopic guided initiation of  bilateral ultrasound assisted catheter directed pulmonary arterial lysis for sub massive pulmonary embolism and right-sided heart strain. 2. Markedly elevated pressure measurements within the right main pulmonary artery compatible with critical pulmonary arterial hypertension. PLAN: - The patient will be assessed approximately 12 hours following the initiation of the catheter directed pulmonary arterial lysis for repeat pressure measurements and either removal of the catheters or continuation of the catheter directed thrombolysis. Electronically Signed   By: Lucrezia Europe M.D.   On: 01/19/2017 12:17   Ir Jacolyn Reedy F/u Eval Art/ven Final Day (ms)  Result Date: 01/19/2017 CLINICAL DATA:  Sub massive pulmonary emboli, status post 12+ hour ultrasound assisted catheter directed bilateral pulmonary arterial thrombolytic infusion, complicated only by Mild sporadic hemoptysis. EXAM: IR THROMB F/U EVAL ART/VEN FINAL DAY ANESTHESIA/SEDATION: None used MEDICATIONS: None CONTRAST:  None PROCEDURE: Stable catheter position confirmed on preprocedure chest radiography. Repeat pulmonary arterial pressure measurements obtained bedside . The catheters and sheaths were removed and hemostasis achieved with manual compression. The patient tolerated the procedure well. COMPLICATIONS: None immediate FINDINGS: Postprocedure pulmonary artery pressure 41/15 (25) mmHg. IMPRESSION: 1. Technically successful catheter directed bilateral pulmonary artery ultrasound assisted thrombolytic infusion. Electronically Signed   By: Lucrezia Europe M.D.   On: 01/19/2017 12:04    Cardiac Studies   01/16/2017 LE Doppler: Findings consistent with acute deep vein thrombosis involving the right lower extremity. No evidence of deep vein thrombosis involving the visualized veins of the left lower extremity.  01/16/2017 ECHO: - Left ventricle: The cavity size was normal. Systolic function was normal. The estimated ejection fraction was in the range of 55% to 60%.  Wall motion was normal; there were no regional wall motion abnormalities. Doppler parameters are consistent with abnormal left ventricular relaxation (grade 1 diastolic dysfunction). - Ventricular septum: Septal motion showed paradox. These changes are consistent with RV-LV interaction. - Left atrium: The atrium was mildly dilated. - Right ventricle: The cavity size was dilated. Wall thickness was normal. Systolic function was severely reduced. - Right atrium: The atrium was moderately dilated. - Tricuspid valve: There was moderate-severe regurgitation. - Pulmonary arteries: Systolic pressure was moderately increased. PA peak pressure: 64 mm Hg (S).  Patient Profile     82 y.o. female with massive saddle PE complicated by RV failure, VT and paroxysmal AFib, improving after EKOS thrombolysis  Assessment & Plan    1.  Acute saddle embolism complicated by profound hypotension and syncope. LE venous duples was positive for DVT in the right posteriotibial trunk and posterior tibial veins. She is currently on IV Heparin with plan to start Coumadin in the next day or so.   2.  Acute RV failure with moderate to severe TR and moderate pulmonary HTN.  Still having intermittent low BPs, but improving. Note that sPAP of >60 mm Hg cannot be entirely attributed to the acute event. It suggests preexisting problem such as OSA or previous ignored CTEP  disease or just her diastolic HF. 3.  New onset atrial fibrillation with RVR and associated wide complex tachycardia worrisome for NSVT, in the setting of acute PE.  She continues to have intermittent PVCs. Currently on amiodarone, but we stop this at the time of discharge.  4.  Chronic diastolic CHF - she  does not have any evidence of volume overload on exam at present. Will continue to monitor for volume overload as she has been receiving large fluid boluses intermittently to maintain BP.   5.  Elevated troponin is a sign of PE severity.  She does have a family  history of CAD in her Dad at 54 and her brother had a defibrillator.  May consider outpatient workup for CAD after she recovers from PE (in several weeks). 6. Obesity: high risk for PE recurrence, consider lifelong anticoagulation, notwithstanding remote history of diverticular bleeding.  Signed, Sanda Klein, MD  01/20/2017, 9:50 AM

## 2017-01-20 NOTE — Assessment & Plan Note (Signed)
Plat 99 at admit . Currently at 54s. True baseline not known  Plan Monitor Will need to sort out true platlele baseline

## 2017-01-21 DIAGNOSIS — R0602 Shortness of breath: Secondary | ICD-10-CM

## 2017-01-21 DIAGNOSIS — R740 Nonspecific elevation of levels of transaminase and lactic acid dehydrogenase [LDH]: Secondary | ICD-10-CM

## 2017-01-21 DIAGNOSIS — R042 Hemoptysis: Secondary | ICD-10-CM

## 2017-01-21 DIAGNOSIS — I2690 Septic pulmonary embolism without acute cor pulmonale: Secondary | ICD-10-CM

## 2017-01-21 DIAGNOSIS — I2699 Other pulmonary embolism without acute cor pulmonale: Secondary | ICD-10-CM

## 2017-01-21 DIAGNOSIS — D696 Thrombocytopenia, unspecified: Secondary | ICD-10-CM

## 2017-01-21 DIAGNOSIS — N179 Acute kidney failure, unspecified: Secondary | ICD-10-CM

## 2017-01-21 LAB — CBC WITH DIFFERENTIAL/PLATELET
Basophils Absolute: 0 10*3/uL (ref 0.0–0.1)
Basophils Relative: 0 %
EOS PCT: 1 %
Eosinophils Absolute: 0.1 10*3/uL (ref 0.0–0.7)
HCT: 34.4 % — ABNORMAL LOW (ref 36.0–46.0)
HEMOGLOBIN: 10.9 g/dL — AB (ref 12.0–15.0)
LYMPHS PCT: 10 %
Lymphs Abs: 0.7 10*3/uL (ref 0.7–4.0)
MCH: 29.5 pg (ref 26.0–34.0)
MCHC: 31.7 g/dL (ref 30.0–36.0)
MCV: 93 fL (ref 78.0–100.0)
MONOS PCT: 8 %
Monocytes Absolute: 0.6 10*3/uL (ref 0.1–1.0)
Neutro Abs: 5.6 10*3/uL (ref 1.7–7.7)
Neutrophils Relative %: 81 %
PLATELETS: 78 10*3/uL — AB (ref 150–400)
RBC: 3.7 MIL/uL — AB (ref 3.87–5.11)
RDW: 15.9 % — ABNORMAL HIGH (ref 11.5–15.5)
WBC: 7 10*3/uL (ref 4.0–10.5)

## 2017-01-21 LAB — BASIC METABOLIC PANEL
Anion gap: 6 (ref 5–15)
BUN: 21 mg/dL — AB (ref 6–20)
CHLORIDE: 105 mmol/L (ref 101–111)
CO2: 25 mmol/L (ref 22–32)
CREATININE: 0.91 mg/dL (ref 0.44–1.00)
Calcium: 8.5 mg/dL — ABNORMAL LOW (ref 8.9–10.3)
GFR calc Af Amer: >60 mL/min (ref >60)
GFR calc non Af Amer: 58 mL/min — ABNORMAL LOW (ref >60)
Glucose, Bld: 163 mg/dL — ABNORMAL HIGH (ref 65–99)
Potassium: 4.1 mmol/L (ref 3.5–5.1)
Sodium: 136 mmol/L (ref 135–145)

## 2017-01-21 LAB — HEPATIC FUNCTION PANEL
ALK PHOS: 158 U/L — AB (ref 38–126)
ALT: 228 U/L — AB (ref 14–54)
AST: 61 U/L — ABNORMAL HIGH (ref 15–41)
Albumin: 2.1 g/dL — ABNORMAL LOW (ref 3.5–5.0)
BILIRUBIN INDIRECT: 0.7 mg/dL (ref 0.3–0.9)
Bilirubin, Direct: 0.2 mg/dL (ref 0.1–0.5)
Total Bilirubin: 0.9 mg/dL (ref 0.3–1.2)
Total Protein: 4.8 g/dL — ABNORMAL LOW (ref 6.5–8.1)

## 2017-01-21 LAB — MAGNESIUM: MAGNESIUM: 1.7 mg/dL (ref 1.7–2.4)

## 2017-01-21 LAB — PHOSPHORUS: PHOSPHORUS: 2.4 mg/dL — AB (ref 2.5–4.6)

## 2017-01-21 LAB — PROTIME-INR
INR: 1.14
PROTHROMBIN TIME: 14.6 s (ref 11.4–15.2)

## 2017-01-21 LAB — HEPARIN LEVEL (UNFRACTIONATED): Heparin Unfractionated: 0.36 IU/mL (ref 0.30–0.70)

## 2017-01-21 MED ORDER — RIVAROXABAN 20 MG PO TABS: 20.0000 mg | ORAL_TABLET | Freq: Every day | ORAL | Status: DC

## 2017-01-21 MED ORDER — RIVAROXABAN 15 MG PO TABS
15.0000 mg | ORAL_TABLET | Freq: Two times a day (BID) | ORAL | Status: DC
  Administered 2017-01-21 – 2017-01-23 (×5): 15 mg via ORAL
  Filled 2017-01-21 (×5): qty 1

## 2017-01-21 MED ORDER — FUROSEMIDE 40 MG PO TABS
40.0000 mg | ORAL_TABLET | Freq: Every day | ORAL | Status: DC
  Administered 2017-01-21 – 2017-01-23 (×3): 40 mg via ORAL
  Filled 2017-01-21 (×3): qty 1

## 2017-01-21 NOTE — Progress Notes (Signed)
PROGRESS NOTE        PATIENT DETAILS Name: Jennifer Medina Age: 82 y.o. Sex: female Date of Birth: [birth year] Admit Date: 01/15/2017 Admitting Physician Rigoberto Noel, MD PCP:No primary care provider on file.  Brief Narrative: Patient is a 82 y.o. female chronic diastolic heart failure who was admitted on 3/7 for a submassive pulmonary embolism, she was initially started on IV heparin, however she subsequently became hemodynamically unstable, required catheter guided thrombolytic therapy. She was managed by [facility] and transferred to the [facility] hospitalist service on 3/13, she remains on IV heparin. See below for further details  Subjective: Lying comfortably in bed-denies any epistaxis. Still having symptoms trace hemoptysis.  Assessment/Plan: Acute submassive pulmonary embolism: Initially tolerated on IV heparin, but became hemodynamically unstable requiring catheter-related thrombolytic therapy. She is currently much better, she is on IV heparin, we will change her to a newer anticoagulant agent today. Will require further hypercoagulable workup and age-appropriate cancer screening workup as an outpatient.  Right lower extremity DVT: On anticoagulation-see above.  A. fib with RVR: Cardiology following, on amiodarone-on anticoagulation as above.CHA2DSVASSC score ot aleast 3.  Elevated troponins: Likely secondary to PE, cardiology planning on outpatient workup after patient recovers from PE.  Hemoptysis: Minimum-probably secondary to PE-continue anticoagulation.  Chronic diastolic CHF: Difficult exam to assess for volume overload-but home weight is up, restart oral diuretics.  Thrombocytopenia: Suspect platelet consumption due to massive clot burden, continue to follow.  Acute kidney injury: Resolved, likely hemodynamically mediated kidney injury.  Elevated AST/ALT: Could be remnant of shock liver-plans are to follow. Benign abdominal exam. If LFTs do not  improve, then will plan on further workup.  DVT Prophylaxis: Full dose anticoagulation with Heparin  Code Status: Full code  Family Communication: None at bedside  Disposition Plan: Remain inpatient-probably will require SNF on discharge  Antimicrobial agents: Anti-infectives    None     Procedures: Echo 3/13>> - Left ventricle: The cavity size was normal. Systolic function was   normal. The estimated ejection fraction was in the range of 55%   to 60%. Wall motion was normal; there were no regional wall   motion abnormalities. Doppler parameters are consistent with   abnormal left ventricular relaxation (grade 1 diastolic   dysfunction). - Ventricular septum: Septal motion showed paradox. These changes   are consistent with RV-LV interaction. - Left atrium: The atrium was mildly dilated. - Right ventricle: The cavity size was dilated. Wall thickness was   normal. Systolic function was severely reduced. - Right atrium: The atrium was moderately dilated. - Tricuspid valve: There was moderate-severe regurgitation. - Pulmonary arteries: Systolic pressure was moderately increased.   PA peak pressure: 64 mm Hg (S).  CONSULTS:  cardiology and pulmonary/intensive care  Time spent: 25- minutes-Greater than 50% of this time was spent in counseling, explanation of diagnosis, planning of further management, and coordination of care.  MEDICATIONS: Scheduled Meds: . amiodarone  400 mg Oral BID  . atorvastatin  40 mg Oral QHS  . furosemide  40 mg Oral Daily  . latanoprost  1 drop Both Eyes QHS  . mouth rinse  15 mL Mouth Rinse BID  . pantoprazole  40 mg Oral Daily   Continuous Infusions: . heparin 1,050 Units/hr (01/20/17 1800)   PRN Meds:.acetaminophen, LORazepam, [DISCONTINUED] ondansetron **OR** ondansetron (ZOFRAN) IV, traMADol   PHYSICAL EXAM: Vital signs:  Vitals:   01/20/17 1400 01/20/17 1514 01/20/17 2135 01/21/17 0517  BP: (!) 95/51 106/66 (!) 131/57 (!) 118/54    Pulse: (!) 58 60 72 63  Resp: 18 20 18 18   Temp:  97.7 F (36.5 C) 97.7 F (36.5 C) 97.9 F (36.6 C)  TempSrc:  Oral Oral Oral  SpO2: 96% 98% 99% 98%  Weight:  118.3 kg (260 lb 12.9 oz)    Height:  5\' 5"  (1.651 m)     Filed Weights   01/18/17 0447 01/18/17 1815 01/20/17 1514  Weight: 110.5 kg (243 lb 9.6 oz) 113.3 kg (249 lb 12.5 oz) 118.3 kg (260 lb 12.9 oz)   Body mass index is 43.4 kg/m.   General appearance :Awake, alert, not in any distress. Speech Clear. Eyes:, pupils equally reactive to light and accomodation,no scleral icterus. HEENT: Atraumatic and Normocephalic Neck: supple, no JVD. No cervical lymphadenopathy. Resp:Good air entry bilaterally, no added sounds  CVS: S1 S2 regular, no murmurs.  GI: Bowel sounds present, Non tender and not distended with no gaurding, rigidity or rebound. Extremities: B/L Lower Ext shows no edema, both legs are warm to touch Neurology:  speech clear,Non focal, sensation is grossly intact. Musculoskeletal:No digital cyanosis Skin:No Rash, warm and dry Wounds:N/A  I have personally reviewed following labs and imaging studies  LABORATORY DATA: CBC:  Recent Labs Lab 01/15/17 2338  01/18/17 2233 01/19/17 0549 01/19/17 1245 01/20/17 0431 01/21/17 0229  WBC 8.7  < > 10.1 8.4 7.7 6.1 7.0  NEUTROABS 6.0  --   --   --   --   --  5.6  HGB 13.8  < > 12.4 11.9* 11.5* 10.7* 10.9*  HCT 43.3  < > 38.2 37.9 36.3 33.6* 34.4*  MCV 95.2  < > 93.9 93.6 93.6 92.6 93.0  PLT 90*  < > 90* 75* 77* 72* 78*  < > = values in this interval not displayed.  Basic Metabolic Panel:  Recent Labs Lab 01/16/17 0545 01/17/17 0529 01/18/17 0356 01/18/17 2101 01/20/17 0431 01/21/17 0229  NA 139 138 135  --  132* 136  K 4.0 3.9 4.4  --  3.9 4.1  CL 109 107 102  --  103 105  CO2 19* 20* 23  --  23 25  GLUCOSE 139* 120* 163*  --  140* 163*  BUN 28* 23* 26*  --  27* 21*  CREATININE 1.35* 1.13* 1.28*  --  1.06* 0.91  CALCIUM 7.8* 8.6* 8.9  --  8.2*  8.5*  MG  --   --   --  1.7  --  1.7  PHOS  --   --   --   --   --  2.4*    GFR: Estimated Creatinine Clearance: 63.4 mL/min (by C-G formula based on SCr of 0.91 mg/dL).  Liver Function Tests:  Recent Labs Lab 01/15/17 2338 01/21/17 0229  AST 94* 61*  ALT 58* 228*  ALKPHOS 138* 158*  BILITOT 1.0 0.9  PROT 5.7* 4.8*  ALBUMIN 2.7* 2.1*   No results for input(s): LIPASE, AMYLASE in the last 168 hours. No results for input(s): AMMONIA in the last 168 hours.  Coagulation Profile:  Recent Labs Lab 01/16/17 0003 01/21/17 0229  INR 1.20 1.14    Cardiac Enzymes:  Recent Labs Lab 01/15/17 2338 01/16/17 0545 01/16/17 1224  TROPONINI 0.38* 0.30* 0.23*    BNP (last 3 results) No results for input(s): PROBNP in the last 8760 hours.  HbA1C: No results for  input(s): HGBA1C in the last 72 hours.  CBG:  Recent Labs Lab 01/16/17 0115 01/16/17 0336 01/16/17 0753 01/16/17 1125 01/16/17 1538  GLUCAP 127* 142* 115* 112* 133*    Lipid Profile: No results for input(s): CHOL, HDL, LDLCALC, TRIG, CHOLHDL, LDLDIRECT in the last 72 hours.  Thyroid Function Tests: No results for input(s): TSH, T4TOTAL, FREET4, T3FREE, THYROIDAB in the last 72 hours.  Anemia Panel: No results for input(s): VITAMINB12, FOLATE, FERRITIN, TIBC, IRON, RETICCTPCT in the last 72 hours.  Urine analysis:    Component Value Date/Time   COLORURINE AMBER (A) 01/16/2017 0303   APPEARANCEUR HAZY (A) 01/16/2017 0303   LABSPEC >1.046 (H) 01/16/2017 0303   PHURINE 5.0 01/16/2017 0303   GLUCOSEU NEGATIVE 01/16/2017 0303   HGBUR LARGE (A) 01/16/2017 0303   BILIRUBINUR NEGATIVE 01/16/2017 0303   KETONESUR NEGATIVE 01/16/2017 0303   PROTEINUR 100 (A) 01/16/2017 0303   NITRITE NEGATIVE 01/16/2017 0303   LEUKOCYTESUR NEGATIVE 01/16/2017 0303    Sepsis Labs: Lactic Acid, Venous    Component Value Date/Time   LATICACIDVEN 2.0 ([place]) 01/16/2017 0545    MICROBIOLOGY: Recent Results (from the past  240 hour(s))  MRSA PCR Screening     Status: None   Collection Time: 01/16/17  1:18 AM  Result Value Ref Range Status   MRSA by PCR NEGATIVE NEGATIVE Final    Comment:        The GeneXpert MRSA Assay (FDA approved for NASAL specimens only), is one component of a comprehensive MRSA colonization surveillance program. It is not intended to diagnose MRSA infection nor to guide or monitor treatment for MRSA infections.     RADIOLOGY STUDIES/RESULTS: Ir Angiogram Pulmonary Bilateral Selective  Result Date: 01/19/2017 INDICATION: Acute bilateral pulmonary emboli. Recent new cardiac arrhythmia and progressive right heart strain. EXAM: 1. ULTRASOUND GUIDANCE FOR VENOUS ACCESS X2 2. DIRECT PULMONARY ARTERY PRESSURE MEASUREMENTS 3. FLUOROSCOPIC GUIDED PLACEMENT OF BILATERAL PULMONARY ARTERIAL LYTIC INFUSION CATHETERS COMPARISON:  Chest CTA - 01/15/2017 MEDICATIONS: Intravenous Fentanyl and Versed were administered as conscious sedation during continuous monitoring of the patient's level of consciousness and physiological / cardiorespiratory status by the radiology RN, with a total moderate sedation time of 75minutes. CONTRAST:  None used FLUOROSCOPY TIME:  6.9 min  (82  uGym2 DAP) COMPLICATIONS: None immediate TECHNIQUE: Informed written consent was obtained from the patient after a discussion of the risks, benefits and alternatives to treatment. Questions regarding the procedure were encouraged and answered. A timeout was performed prior to the initiation of the procedure. Previous ultrasound had demonstrated right lower extremity DVT. Ultrasound scanning was performed of the right IJ vein demonstrating widely patent. As such, the right internal jugular vein was selected for vascular access. The right neck was prepped and draped in the usual sterile fashion, and a sterile drape was applied covering the operative field. Maximum barrier sterile technique with sterile gowns and gloves were used for the  procedure. A timeout was performed prior to the initiation of the procedure. Local anesthesia was provided with 1% lidocaine. Under direct ultrasound guidance, the right internal jugular vein was accessed with a micro puncture sheath ultimately allowing placement of a 6 French vascular sheath. With the use of a glidewire, an angled pigtail catheter was advanced into the right main pulmonary artery and Pressure measurements were then obtained from the right pulmonary artery. Over a wire, the pigtail catheter was exchanged for a 105/18 cm multi side-hole EKOS ultrasound assisted infusion catheter. Slightly cranial to this initial access, the right internal  jugular vein was again accessed with a micropuncture sheath ultimately allowing placement of a 6 French vascular sheath. With the use of a glidewire, a pigtail catheter was advanced into the left main pulmonary artery . Over an exchange length wire, the pigtail catheter was exchanged for a 105/12 cm multi side-hole EKOS ultrasound assisted infusion catheter. A postprocedural fluoroscopic image was obtained of the check demonstrating final catheter positioning. Both vascular sheath were secured at the right neck with interrupted 0 Prolene suture. The external catheter tubing was secured at the right chest and the lytic therapy was initiated. The patient tolerated the procedure well without immediate postprocedural complication. FINDINGS: Directly recorded proximal right pulmonary artery pressure measurements: 46/10 (23) mmHg (normal: < 25/10) Following the procedure, both ultrasound assisted infusion catheter tips terminate within the distal aspects of the bilateral lower lobe sub segmental pulmonary arteries. IMPRESSION: 1. Successful fluoroscopic guided initiation of bilateral ultrasound assisted catheter directed pulmonary arterial lysis for sub massive pulmonary embolism and right-sided heart strain. 2. Markedly elevated pressure measurements within the right  main pulmonary artery compatible with critical pulmonary arterial hypertension. PLAN: - The patient will be assessed approximately 12 hours following the initiation of the catheter directed pulmonary arterial lysis for repeat pressure measurements and either removal of the catheters or continuation of the catheter directed thrombolysis. Electronically Signed   By: Lucrezia Europe M.D.   On: 01/19/2017 12:17   Ir Angiogram Selective Each Additional Vessel  Result Date: 01/19/2017 INDICATION: Acute bilateral pulmonary emboli. Recent new cardiac arrhythmia and progressive right heart strain. EXAM: 1. ULTRASOUND GUIDANCE FOR VENOUS ACCESS X2 2. DIRECT PULMONARY ARTERY PRESSURE MEASUREMENTS 3. FLUOROSCOPIC GUIDED PLACEMENT OF BILATERAL PULMONARY ARTERIAL LYTIC INFUSION CATHETERS COMPARISON:  Chest CTA - 01/15/2017 MEDICATIONS: Intravenous Fentanyl and Versed were administered as conscious sedation during continuous monitoring of the patient's level of consciousness and physiological / cardiorespiratory status by the radiology RN, with a total moderate sedation time of 60minutes. CONTRAST:  None used FLUOROSCOPY TIME:  6.9 min  (82  uGym2 DAP) COMPLICATIONS: None immediate TECHNIQUE: Informed written consent was obtained from the patient after a discussion of the risks, benefits and alternatives to treatment. Questions regarding the procedure were encouraged and answered. A timeout was performed prior to the initiation of the procedure. Previous ultrasound had demonstrated right lower extremity DVT. Ultrasound scanning was performed of the right IJ vein demonstrating widely patent. As such, the right internal jugular vein was selected for vascular access. The right neck was prepped and draped in the usual sterile fashion, and a sterile drape was applied covering the operative field. Maximum barrier sterile technique with sterile gowns and gloves were used for the procedure. A timeout was performed prior to the initiation  of the procedure. Local anesthesia was provided with 1% lidocaine. Under direct ultrasound guidance, the right internal jugular vein was accessed with a micro puncture sheath ultimately allowing placement of a 6 French vascular sheath. With the use of a glidewire, an angled pigtail catheter was advanced into the right main pulmonary artery and Pressure measurements were then obtained from the right pulmonary artery. Over a wire, the pigtail catheter was exchanged for a 105/18 cm multi side-hole EKOS ultrasound assisted infusion catheter. Slightly cranial to this initial access, the right internal jugular vein was again accessed with a micropuncture sheath ultimately allowing placement of a 6 French vascular sheath. With the use of a glidewire, a pigtail catheter was advanced into the left main pulmonary artery . Over an exchange length wire,  the pigtail catheter was exchanged for a 105/12 cm multi side-hole EKOS ultrasound assisted infusion catheter. A postprocedural fluoroscopic image was obtained of the check demonstrating final catheter positioning. Both vascular sheath were secured at the right neck with interrupted 0 Prolene suture. The external catheter tubing was secured at the right chest and the lytic therapy was initiated. The patient tolerated the procedure well without immediate postprocedural complication. FINDINGS: Directly recorded proximal right pulmonary artery pressure measurements: 46/10 (23) mmHg (normal: < 25/10) Following the procedure, both ultrasound assisted infusion catheter tips terminate within the distal aspects of the bilateral lower lobe sub segmental pulmonary arteries. IMPRESSION: 1. Successful fluoroscopic guided initiation of bilateral ultrasound assisted catheter directed pulmonary arterial lysis for sub massive pulmonary embolism and right-sided heart strain. 2. Markedly elevated pressure measurements within the right main pulmonary artery compatible with critical pulmonary  arterial hypertension. PLAN: - The patient will be assessed approximately 12 hours following the initiation of the catheter directed pulmonary arterial lysis for repeat pressure measurements and either removal of the catheters or continuation of the catheter directed thrombolysis. Electronically Signed   By: Lucrezia Europe M.D.   On: 01/19/2017 12:17   Ir Angiogram Selective Each Additional Vessel  Result Date: 01/19/2017 INDICATION: Acute bilateral pulmonary emboli. Recent new cardiac arrhythmia and progressive right heart strain. EXAM: 1. ULTRASOUND GUIDANCE FOR VENOUS ACCESS X2 2. DIRECT PULMONARY ARTERY PRESSURE MEASUREMENTS 3. FLUOROSCOPIC GUIDED PLACEMENT OF BILATERAL PULMONARY ARTERIAL LYTIC INFUSION CATHETERS COMPARISON:  Chest CTA - 01/15/2017 MEDICATIONS: Intravenous Fentanyl and Versed were administered as conscious sedation during continuous monitoring of the patient's level of consciousness and physiological / cardiorespiratory status by the radiology RN, with a total moderate sedation time of 20minutes. CONTRAST:  None used FLUOROSCOPY TIME:  6.9 min  (82  uGym2 DAP) COMPLICATIONS: None immediate TECHNIQUE: Informed written consent was obtained from the patient after a discussion of the risks, benefits and alternatives to treatment. Questions regarding the procedure were encouraged and answered. A timeout was performed prior to the initiation of the procedure. Previous ultrasound had demonstrated right lower extremity DVT. Ultrasound scanning was performed of the right IJ vein demonstrating widely patent. As such, the right internal jugular vein was selected for vascular access. The right neck was prepped and draped in the usual sterile fashion, and a sterile drape was applied covering the operative field. Maximum barrier sterile technique with sterile gowns and gloves were used for the procedure. A timeout was performed prior to the initiation of the procedure. Local anesthesia was provided with 1%  lidocaine. Under direct ultrasound guidance, the right internal jugular vein was accessed with a micro puncture sheath ultimately allowing placement of a 6 French vascular sheath. With the use of a glidewire, an angled pigtail catheter was advanced into the right main pulmonary artery and Pressure measurements were then obtained from the right pulmonary artery. Over a wire, the pigtail catheter was exchanged for a 105/18 cm multi side-hole EKOS ultrasound assisted infusion catheter. Slightly cranial to this initial access, the right internal jugular vein was again accessed with a micropuncture sheath ultimately allowing placement of a 6 French vascular sheath. With the use of a glidewire, a pigtail catheter was advanced into the left main pulmonary artery . Over an exchange length wire, the pigtail catheter was exchanged for a 105/12 cm multi side-hole EKOS ultrasound assisted infusion catheter. A postprocedural fluoroscopic image was obtained of the check demonstrating final catheter positioning. Both vascular sheath were secured at the right neck with interrupted 0  Prolene suture. The external catheter tubing was secured at the right chest and the lytic therapy was initiated. The patient tolerated the procedure well without immediate postprocedural complication. FINDINGS: Directly recorded proximal right pulmonary artery pressure measurements: 46/10 (23) mmHg (normal: < 25/10) Following the procedure, both ultrasound assisted infusion catheter tips terminate within the distal aspects of the bilateral lower lobe sub segmental pulmonary arteries. IMPRESSION: 1. Successful fluoroscopic guided initiation of bilateral ultrasound assisted catheter directed pulmonary arterial lysis for sub massive pulmonary embolism and right-sided heart strain. 2. Markedly elevated pressure measurements within the right main pulmonary artery compatible with critical pulmonary arterial hypertension. PLAN: - The patient will be assessed  approximately 12 hours following the initiation of the catheter directed pulmonary arterial lysis for repeat pressure measurements and either removal of the catheters or continuation of the catheter directed thrombolysis. Electronically Signed   By: Lucrezia Europe M.D.   On: 01/19/2017 12:17   Ir US Guide Vasc Access Right  Result Date: 01/19/2017 INDICATION: Acute bilateral pulmonary emboli. Recent new cardiac arrhythmia and progressive right heart strain. EXAM: 1. ULTRASOUND GUIDANCE FOR VENOUS ACCESS X2 2. DIRECT PULMONARY ARTERY PRESSURE MEASUREMENTS 3. FLUOROSCOPIC GUIDED PLACEMENT OF BILATERAL PULMONARY ARTERIAL LYTIC INFUSION CATHETERS COMPARISON:  Chest CTA - 01/15/2017 MEDICATIONS: Intravenous Fentanyl and Versed were administered as conscious sedation during continuous monitoring of the patient's level of consciousness and physiological / cardiorespiratory status by the radiology RN, with a total moderate sedation time of 71minutes. CONTRAST:  None used FLUOROSCOPY TIME:  6.9 min  (82  uGym2 DAP) COMPLICATIONS: None immediate TECHNIQUE: Informed written consent was obtained from the patient after a discussion of the risks, benefits and alternatives to treatment. Questions regarding the procedure were encouraged and answered. A timeout was performed prior to the initiation of the procedure. Previous ultrasound had demonstrated right lower extremity DVT. Ultrasound scanning was performed of the right IJ vein demonstrating widely patent. As such, the right internal jugular vein was selected for vascular access. The right neck was prepped and draped in the usual sterile fashion, and a sterile drape was applied covering the operative field. Maximum barrier sterile technique with sterile gowns and gloves were used for the procedure. A timeout was performed prior to the initiation of the procedure. Local anesthesia was provided with 1% lidocaine. Under direct ultrasound guidance, the right internal jugular vein  was accessed with a micro puncture sheath ultimately allowing placement of a 6 French vascular sheath. With the use of a glidewire, an angled pigtail catheter was advanced into the right main pulmonary artery and Pressure measurements were then obtained from the right pulmonary artery. Over a wire, the pigtail catheter was exchanged for a 105/18 cm multi side-hole EKOS ultrasound assisted infusion catheter. Slightly cranial to this initial access, the right internal jugular vein was again accessed with a micropuncture sheath ultimately allowing placement of a 6 French vascular sheath. With the use of a glidewire, a pigtail catheter was advanced into the left main pulmonary artery . Over an exchange length wire, the pigtail catheter was exchanged for a 105/12 cm multi side-hole EKOS ultrasound assisted infusion catheter. A postprocedural fluoroscopic image was obtained of the check demonstrating final catheter positioning. Both vascular sheath were secured at the right neck with interrupted 0 Prolene suture. The external catheter tubing was secured at the right chest and the lytic therapy was initiated. The patient tolerated the procedure well without immediate postprocedural complication. FINDINGS: Directly recorded proximal right pulmonary artery pressure measurements: 46/10 (23) mmHg (normal: <  25/10) Following the procedure, both ultrasound assisted infusion catheter tips terminate within the distal aspects of the bilateral lower lobe sub segmental pulmonary arteries. IMPRESSION: 1. Successful fluoroscopic guided initiation of bilateral ultrasound assisted catheter directed pulmonary arterial lysis for sub massive pulmonary embolism and right-sided heart strain. 2. Markedly elevated pressure measurements within the right main pulmonary artery compatible with critical pulmonary arterial hypertension. PLAN: - The patient will be assessed approximately 12 hours following the initiation of the catheter directed  pulmonary arterial lysis for repeat pressure measurements and either removal of the catheters or continuation of the catheter directed thrombolysis. Electronically Signed   By: Lucrezia Europe M.D.   On: 01/19/2017 12:17   Dg Chest Port 1 View  Result Date: 01/20/2017 CLINICAL DATA:  Respiratory failure. EXAM: PORTABLE CHEST 1 VIEW COMPARISON:  01/19/2017. FINDINGS: Interim removal of pulmonary infusion catheters. Stable cardiomegaly. Low lung volumes with basilar subsegmental atelectasis. Small right pleural effusion cannot be excluded. No pneumothorax. Postsurgical changes right shoulder. IMPRESSION: 1. Interim removal of pulmonary infusion catheters. 2. Stable cardiomegaly. 3. Low lung volumes with mild basilar atelectasis . Small right pleural effusion cannot be excluded. Electronically Signed   By: Marcello Moores  Register   On: 01/20/2017 07:05   Dg Chest Port 1 View  Result Date: 01/19/2017 CLINICAL DATA:  82 year old female with acute saddle embolus. Postoperative day 1 status post catheter directed ultrasound-assisted bilateral pulmonary arterial thrombolysis initiated via R IJ x2. Hemoptysis EXAM: PORTABLE CHEST 1 VIEW COMPARISON:  01/15/2017 portable chest and earlier. FINDINGS: Portable AP semi upright view at 0947 hours. Right IJ approach pulmonary artery infusion catheters are in place. Left chest pacer or resuscitation pads are in place. Stable lung volumes. Stable cardiac size and mediastinal contours. Allowing for portable technique the lungs are clear. IMPRESSION: Right IJ approach pulmonary artery infusion catheters in place, otherwise stable and negative portable radiographic appearance of the chest. Electronically Signed   By: Genevie Ann M.D.   On: 01/19/2017 10:07   Dg Chest Port 1 View  Result Date: 01/15/2017 CLINICAL DATA:  Shortness of breath EXAM: PORTABLE CHEST 1 VIEW COMPARISON:  01/15/2017, FINDINGS: No acute infiltrate or effusion. Stable cardiomediastinal silhouette. No pneumothorax.  IMPRESSION: Stable borderline to mild cardiomegaly.  No edema.  No infiltrate. Electronically Signed   By: Donavan Foil M.D.   On: 01/15/2017 23:38   Ir Infusion Thrombol Arterial Initial (ms)  Result Date: 01/19/2017 INDICATION: Acute bilateral pulmonary emboli. Recent new cardiac arrhythmia and progressive right heart strain. EXAM: 1. ULTRASOUND GUIDANCE FOR VENOUS ACCESS X2 2. DIRECT PULMONARY ARTERY PRESSURE MEASUREMENTS 3. FLUOROSCOPIC GUIDED PLACEMENT OF BILATERAL PULMONARY ARTERIAL LYTIC INFUSION CATHETERS COMPARISON:  Chest CTA - 01/15/2017 MEDICATIONS: Intravenous Fentanyl and Versed were administered as conscious sedation during continuous monitoring of the patient's level of consciousness and physiological / cardiorespiratory status by the radiology RN, with a total moderate sedation time of 27minutes. CONTRAST:  None used FLUOROSCOPY TIME:  6.9 min  (82  uGym2 DAP) COMPLICATIONS: None immediate TECHNIQUE: Informed written consent was obtained from the patient after a discussion of the risks, benefits and alternatives to treatment. Questions regarding the procedure were encouraged and answered. A timeout was performed prior to the initiation of the procedure. Previous ultrasound had demonstrated right lower extremity DVT. Ultrasound scanning was performed of the right IJ vein demonstrating widely patent. As such, the right internal jugular vein was selected for vascular access. The right neck was prepped and draped in the usual sterile fashion, and a sterile drape was  applied covering the operative field. Maximum barrier sterile technique with sterile gowns and gloves were used for the procedure. A timeout was performed prior to the initiation of the procedure. Local anesthesia was provided with 1% lidocaine. Under direct ultrasound guidance, the right internal jugular vein was accessed with a micro puncture sheath ultimately allowing placement of a 6 French vascular sheath. With the use of a  glidewire, an angled pigtail catheter was advanced into the right main pulmonary artery and Pressure measurements were then obtained from the right pulmonary artery. Over a wire, the pigtail catheter was exchanged for a 105/18 cm multi side-hole EKOS ultrasound assisted infusion catheter. Slightly cranial to this initial access, the right internal jugular vein was again accessed with a micropuncture sheath ultimately allowing placement of a 6 French vascular sheath. With the use of a glidewire, a pigtail catheter was advanced into the left main pulmonary artery . Over an exchange length wire, the pigtail catheter was exchanged for a 105/12 cm multi side-hole EKOS ultrasound assisted infusion catheter. A postprocedural fluoroscopic image was obtained of the check demonstrating final catheter positioning. Both vascular sheath were secured at the right neck with interrupted 0 Prolene suture. The external catheter tubing was secured at the right chest and the lytic therapy was initiated. The patient tolerated the procedure well without immediate postprocedural complication. FINDINGS: Directly recorded proximal right pulmonary artery pressure measurements: 46/10 (23) mmHg (normal: < 25/10) Following the procedure, both ultrasound assisted infusion catheter tips terminate within the distal aspects of the bilateral lower lobe sub segmental pulmonary arteries. IMPRESSION: 1. Successful fluoroscopic guided initiation of bilateral ultrasound assisted catheter directed pulmonary arterial lysis for sub massive pulmonary embolism and right-sided heart strain. 2. Markedly elevated pressure measurements within the right main pulmonary artery compatible with critical pulmonary arterial hypertension. PLAN: - The patient will be assessed approximately 12 hours following the initiation of the catheter directed pulmonary arterial lysis for repeat pressure measurements and either removal of the catheters or continuation of the catheter  directed thrombolysis. Electronically Signed   By: Lucrezia Europe M.D.   On: 01/19/2017 12:17   Ir Infusion Thrombol Arterial Initial (ms)  Result Date: 01/19/2017 INDICATION: Acute bilateral pulmonary emboli. Recent new cardiac arrhythmia and progressive right heart strain. EXAM: 1. ULTRASOUND GUIDANCE FOR VENOUS ACCESS X2 2. DIRECT PULMONARY ARTERY PRESSURE MEASUREMENTS 3. FLUOROSCOPIC GUIDED PLACEMENT OF BILATERAL PULMONARY ARTERIAL LYTIC INFUSION CATHETERS COMPARISON:  Chest CTA - 01/15/2017 MEDICATIONS: Intravenous Fentanyl and Versed were administered as conscious sedation during continuous monitoring of the patient's level of consciousness and physiological / cardiorespiratory status by the radiology RN, with a total moderate sedation time of 5minutes. CONTRAST:  None used FLUOROSCOPY TIME:  6.9 min  (82  uGym2 DAP) COMPLICATIONS: None immediate TECHNIQUE: Informed written consent was obtained from the patient after a discussion of the risks, benefits and alternatives to treatment. Questions regarding the procedure were encouraged and answered. A timeout was performed prior to the initiation of the procedure. Previous ultrasound had demonstrated right lower extremity DVT. Ultrasound scanning was performed of the right IJ vein demonstrating widely patent. As such, the right internal jugular vein was selected for vascular access. The right neck was prepped and draped in the usual sterile fashion, and a sterile drape was applied covering the operative field. Maximum barrier sterile technique with sterile gowns and gloves were used for the procedure. A timeout was performed prior to the initiation of the procedure. Local anesthesia was provided with 1% lidocaine. Under direct ultrasound guidance,  the right internal jugular vein was accessed with a micro puncture sheath ultimately allowing placement of a 6 French vascular sheath. With the use of a glidewire, an angled pigtail catheter was advanced into the right  main pulmonary artery and Pressure measurements were then obtained from the right pulmonary artery. Over a wire, the pigtail catheter was exchanged for a 105/18 cm multi side-hole EKOS ultrasound assisted infusion catheter. Slightly cranial to this initial access, the right internal jugular vein was again accessed with a micropuncture sheath ultimately allowing placement of a 6 French vascular sheath. With the use of a glidewire, a pigtail catheter was advanced into the left main pulmonary artery . Over an exchange length wire, the pigtail catheter was exchanged for a 105/12 cm multi side-hole EKOS ultrasound assisted infusion catheter. A postprocedural fluoroscopic image was obtained of the check demonstrating final catheter positioning. Both vascular sheath were secured at the right neck with interrupted 0 Prolene suture. The external catheter tubing was secured at the right chest and the lytic therapy was initiated. The patient tolerated the procedure well without immediate postprocedural complication. FINDINGS: Directly recorded proximal right pulmonary artery pressure measurements: 46/10 (23) mmHg (normal: < 25/10) Following the procedure, both ultrasound assisted infusion catheter tips terminate within the distal aspects of the bilateral lower lobe sub segmental pulmonary arteries. IMPRESSION: 1. Successful fluoroscopic guided initiation of bilateral ultrasound assisted catheter directed pulmonary arterial lysis for sub massive pulmonary embolism and right-sided heart strain. 2. Markedly elevated pressure measurements within the right main pulmonary artery compatible with critical pulmonary arterial hypertension. PLAN: - The patient will be assessed approximately 12 hours following the initiation of the catheter directed pulmonary arterial lysis for repeat pressure measurements and either removal of the catheters or continuation of the catheter directed thrombolysis. Electronically Signed   By: Lucrezia Europe M.D.    On: 01/19/2017 12:17   Ir Jacolyn Reedy F/u Eval Art/ven Final Day (ms)  Result Date: 01/19/2017 CLINICAL DATA:  Sub massive pulmonary emboli, status post 12+ hour ultrasound assisted catheter directed bilateral pulmonary arterial thrombolytic infusion, complicated only by Mild sporadic hemoptysis. EXAM: IR THROMB F/U EVAL ART/VEN FINAL DAY ANESTHESIA/SEDATION: None used MEDICATIONS: None CONTRAST:  None PROCEDURE: Stable catheter position confirmed on preprocedure chest radiography. Repeat pulmonary arterial pressure measurements obtained bedside . The catheters and sheaths were removed and hemostasis achieved with manual compression. The patient tolerated the procedure well. COMPLICATIONS: None immediate FINDINGS: Postprocedure pulmonary artery pressure 41/15 (25) mmHg. IMPRESSION: 1. Technically successful catheter directed bilateral pulmonary artery ultrasound assisted thrombolytic infusion. Electronically Signed   By: Lucrezia Europe M.D.   On: 01/19/2017 12:04     LOS: 6 days   Oren Binet, MD  [facility] Pager:[phone]  If 7PM-7AM, please contact night-coverage [email or web address] Password [facility] 01/21/2017, 9:56 AM

## 2017-01-21 NOTE — Care Management Important Message (Signed)
Important Message  Patient Details  Name: Jennifer Medina MRN: [id number] Date of Birth: 01/28/1935   Medicare Important Message Given:  Yes    Nathen May 01/21/2017, 12:36 PM

## 2017-01-21 NOTE — Progress Notes (Signed)
Name: Jennifer Medina MRN: [id number] DOB: September 07, 1935    ADMISSION DATE:  01/15/2017 CONSULTATION DATE:  01/15/2017  REFERRING MD :  Dr. Hal Hope   CHIEF COMPLAINT:  PE S/P EKOS   BRIEF PATIENT DESCRIPTION:  82 year old female admitted with saddle PE and syncope, and hypotension. On 3/10 developed a syncopal episode with VT seen on the monitor and brought back to the ICU for EKOS protocol.  SIGNIFICANT EVENTS  3/7 > ED with syncope > saddle PE  3/10 > Syncope/VT > EKOS  3/12 > Telemetry   STUDIES:  CTA-Pulmonary 3/7 > PE ECHO 3/8 > LVEF 78-29%, grade 1 diastolic dysfunction, RV dilated, RA mod dilated, pa peak 64 VAS [place] Lower Extremity 3/8 > Right lower extremity DVT  CXR 3/12 > Cardiomegaly, low lung volumes with mild basilar atelectasis, possible small right pleural effusion   SUBJECTIVE:  Reports minimally hemoptysis and dyspnea   VITAL SIGNS: Temp:  [97.7 F (36.5 C)-97.9 F (36.6 C)] 97.9 F (36.6 C) (03/13 0517) Pulse Rate:  [56-72] 63 (03/13 0517) Resp:  [15-20] 18 (03/13 0517) BP: (89-131)/(51-79) 118/54 (03/13 0517) SpO2:  [87 %-100 %] 98 % (03/13 0517) Weight:  [118.3 kg (260 lb 12.9 oz)] 118.3 kg (260 lb 12.9 oz) (03/12 1514)  PHYSICAL EXAMINATION: General:  Elderly female, no distress  Neuro:  Alert, oriented, grossly intact  HEENT:  Normocephalic  Cardiovascular:  RRR, no MRG, NI S1/S2  Lungs:  Diminished breath sounds, no wheeze/crackles, non-labored  Abdomen:  Obese, active bowel sounds  Musculoskeletal:  +2 to BLE Skin:  Bruising noted throughout, dry, intact    Recent Labs Lab 01/18/17 0356 01/20/17 0431 01/21/17 0229  NA 135 132* 136  K 4.4 3.9 4.1  CL 102 103 105  CO2 23 23 25   BUN 26* 27* 21*  CREATININE 1.28* 1.06* 0.91  GLUCOSE 163* 140* 163*    Recent Labs Lab 01/19/17 1245 01/20/17 0431 01/21/17 0229  HGB 11.5* 10.7* 10.9*  HCT 36.3 33.6* 34.4*  WBC 7.7 6.1 7.0  PLT 77* 72* 78*   Dg Chest Port 1 View  Result Date:  01/20/2017 CLINICAL DATA:  Respiratory failure. EXAM: PORTABLE CHEST 1 VIEW COMPARISON:  01/19/2017. FINDINGS: Interim removal of pulmonary infusion catheters. Stable cardiomegaly. Low lung volumes with basilar subsegmental atelectasis. Small right pleural effusion cannot be excluded. No pneumothorax. Postsurgical changes right shoulder. IMPRESSION: 1. Interim removal of pulmonary infusion catheters. 2. Stable cardiomegaly. 3. Low lung volumes with mild basilar atelectasis . Small right pleural effusion cannot be excluded. Electronically Signed   By: Marcello Moores  Register   On: 01/20/2017 07:05   Dg Chest Port 1 View  Result Date: 01/19/2017 CLINICAL DATA:  82 year old female with acute saddle embolus. Postoperative day 1 status post catheter directed ultrasound-assisted bilateral pulmonary arterial thrombolysis initiated via R IJ x2. Hemoptysis EXAM: PORTABLE CHEST 1 VIEW COMPARISON:  01/15/2017 portable chest and earlier. FINDINGS: Portable AP semi upright view at 0947 hours. Right IJ approach pulmonary artery infusion catheters are in place. Left chest pacer or resuscitation pads are in place. Stable lung volumes. Stable cardiac size and mediastinal contours. Allowing for portable technique the lungs are clear. IMPRESSION: Right IJ approach pulmonary artery infusion catheters in place, otherwise stable and negative portable radiographic appearance of the chest. Electronically Signed   By: Genevie Ann M.D.   On: 01/19/2017 10:07   Ir Jacolyn Reedy F/u Eval Art/ven Final Day (ms)  Result Date: 01/19/2017 CLINICAL DATA:  Sub massive pulmonary emboli, status  post 12+ hour ultrasound assisted catheter directed bilateral pulmonary arterial thrombolytic infusion, complicated only by Mild sporadic hemoptysis. EXAM: IR THROMB F/U EVAL ART/VEN FINAL DAY ANESTHESIA/SEDATION: None used MEDICATIONS: None CONTRAST:  None PROCEDURE: Stable catheter position confirmed on preprocedure chest radiography. Repeat pulmonary arterial pressure  measurements obtained bedside . The catheters and sheaths were removed and hemostasis achieved with manual compression. The patient tolerated the procedure well. COMPLICATIONS: None immediate FINDINGS: Postprocedure pulmonary artery pressure 41/15 (25) mmHg. IMPRESSION: 1. Technically successful catheter directed bilateral pulmonary artery ultrasound assisted thrombolytic infusion. Electronically Signed   By: Lucrezia Europe M.D.   On: 01/19/2017 12:04    ASSESSMENT / PLAN:  Acute Hypoxic Respiratory Failure secondary to saddle pulmonary embolism with DVT to right lower extremity s/p EKOS  Plan  -Maintain Oxygen Saturation >92 (on 2L [place] currently) -Maintain IV Heparin for another 24 hours then change to oral unless increase in hemoptysis  -Pulmonary Hygiene   PA pressure > 60 secondary to Diastolic HF vs OSA  Plan -Cardiology Following  -Will need repeat ECHO in outpatient setting  -Will follow up in outpatient   Will make follow-up appointment and see outpatient, is needed again in hospital setting please re-consult.   Hayden Pedro, AG-ACNP [facility]  Pgr: [phone]  PCCM Pgr: [phone]  STAFF NOTE: Linwood Dibbles, MD FACP have personally reviewed patient's available data, including medical history, events of note, physical examination and test results as part of my evaluation. I have discussed with resident/NP and other care providers such as pharmacist, RN and RRT. In addition, I personally evaluated patient and elicited key findings of: awake, in bed, NO SOB at rest, lungs clear, mild scant blood tinged secretions noted in pan, CT reviewed with sig clot burden, s/p ekos, mild low plat noted that was likely related to lytic and consumption, would follow plat further, would consider oral therapy as hemoptysis currently not clinically significant, her Pa pressure 64 is NOT accounted totoally from PE acute, so we need outpt sleep study for cause of Pa  pressures greater 45, also would continued diuresis as may have dCHF contibution and re[eat acho may have lower pa pressures after fluid off, her leg edema is significant, will sign off call if needed, should follow upDr Oletha Blend. Titus Mould, MD, [place] Pgr: [facility] 01/21/2017 1:37 PM

## 2017-01-21 NOTE — Plan of Care (Signed)
Problem: Safety: Goal: Ability to remain free from injury will improve Call bell within reach. Bed in low position. Hourly rounding continues.

## 2017-01-21 NOTE — Progress Notes (Addendum)
ANTICOAGULATION CONSULT NOTE - Follow Up Consult  Pharmacy Consult for Heparin Indication: pulmonary embolus  Allergies  Allergen Reactions  . Aspirin Other (See Comments)    GI bleed  . Nsaids Other (See Comments)    GI bleed  . Penicillins Shortness Of Breath and Rash  . Simvastatin Nausea Only  . Vancomycin Rash    Patient Measurements: Height: 5\' 5"  (165.1 cm) Weight: 260 lb 12.9 oz (118.3 kg) IBW/kg (Calculated) : 57 Heparin Dosing Weight: 79 Kg  Vital Signs: Temp: 97.9 F (36.6 C) (03/13 0517) Temp Source: Oral (03/13 0517) BP: 118/54 (03/13 0517) Pulse Rate: 63 (03/13 0517)  Labs:  Recent Labs  01/19/17 1245  01/19/17 2200 01/20/17 0431 01/21/17 0229  HGB 11.5*  --   --  10.7* 10.9*  HCT 36.3  --   --  33.6* 34.4*  PLT 77*  --   --  72* 78*  LABPROT  --   --   --   --  14.6  INR  --   --   --   --  1.14  HEPARINUNFRC  --   < > 0.36 0.43 0.36  CREATININE  --   --   --  1.06* 0.91  < > = values in this interval not displayed.  Estimated Creatinine Clearance: 63.4 mL/min (by C-G formula based on SCr of 0.91 mg/dL).  Assessment: 82 year old female on heparin for extensive bilateral pulmonary embolism with R heart strain and atrial fibrillation. S/p cath directed USAT (alteplase) 3/10-3/11. Heparin level remains therapeutic (0.36) on 1050 units/hr. Would like heparin level in upper end of range with large clot burden. Pt with a little bit of blood tinged sputum s/p EKOS, following closely - continues but remains mild per RN. No other bleed or IV line issues reported. Hg low stable, platelets slightly up to 78 (90 on admission).  Goal of Therapy:  Heparin level 0.3-0.7 units/ml Monitor platelets by anticoagulation protocol: Yes  Plan: Increase heparin slightly to 1100 units/hr to keep on higher end of range Daily heparin level/CBC Monitor closely for s/sx bleeding F/u long-term anticoagulation plan   Elicia Lamp, PharmD, BCPS Clinical  Pharmacist 01/21/2017 9:08 AM    ADDENDUM:  Per RN, patient with moderate oozing from IV line, which then came out. Heparin rate had not yet been increased (will d/c increase rate order). RN notified Pharmacy drip resumed - will keep at previous rate of 1050 units/h and check heparin level to confirm therapeutic after restart. IV line oozing has resolved per RN, no issues with blood-tinged sputum currently.  Plan: Resume heparin at 1050 units/h 6h heparin level Monitor for closely for s/sx bleeding   Elicia Lamp, PharmD, BCPS Clinical Pharmacist 01/21/2017 10:08 AM

## 2017-01-21 NOTE — Progress Notes (Signed)
Progress Note  Patient Name: Jennifer Medina Date of Encounter: 01/21/2017  Primary Cardiologist: Radford Pax  Subjective   Pleuritic chest discomfort woke her last night, resolved with analgesics. Having a hard time standing up with PT assistance - weak and dizzy (but BP normal and O2 sat OK on 2L [place]). No AFib or VT on monitor, HR briefly <50 overnight.  Inpatient Medications    Scheduled Meds: . amiodarone  400 mg Oral BID  . atorvastatin  40 mg Oral QHS  . latanoprost  1 drop Both Eyes QHS  . mouth rinse  15 mL Mouth Rinse BID  . pantoprazole  40 mg Oral Daily   Continuous Infusions: . heparin 1,050 Units/hr (01/20/17 1800)   PRN Meds: acetaminophen, LORazepam, [DISCONTINUED] ondansetron **OR** ondansetron (ZOFRAN) IV, traMADol   Vital Signs    Vitals:   01/20/17 1400 01/20/17 1514 01/20/17 2135 01/21/17 0517  BP: (!) 95/51 106/66 (!) 131/57 (!) 118/54  Pulse: (!) 58 60 72 63  Resp: 18 20 18 18   Temp:  97.7 F (36.5 C) 97.7 F (36.5 C) 97.9 F (36.6 C)  TempSrc:  Oral Oral Oral  SpO2: 96% 98% 99% 98%  Weight:  118.3 kg (260 lb 12.9 oz)    Height:  5\' 5"  (1.651 m)      Intake/Output Summary (Last 24 hours) at 01/21/17 0920 Last data filed at 01/20/17 1800  Gross per 24 hour  Intake               94 ml  Output              100 ml  Net               -6 ml   Filed Weights   01/18/17 0447 01/18/17 1815 01/20/17 1514  Weight: 110.5 kg (243 lb 9.6 oz) 113.3 kg (249 lb 12.5 oz) 118.3 kg (260 lb 12.9 oz)    Telemetry    NSR/mild sinus brady - Personally Reviewed  ECG    Last on 01/18/17 - Personally Reviewed  Physical Exam  Morbidly obese. GEN: No acute distress.   Neck: No JVD Cardiac: RRR, no murmurs, rubs, or gallops.  Respiratory: Clear to auscultation bilaterally. GI: Soft, nontender, non-distended  MS: No edema; No deformity. Neuro:  Nonfocal  Psych: Normal affect   Labs    Chemistry Recent Labs Lab 01/15/17 2338  01/18/17 0356  01/20/17 0431 01/21/17 0229  NA 138  < > 135 132* 136  K 4.1  < > 4.4 3.9 4.1  CL 102  < > 102 103 105  CO2 19*  < > 23 23 25   GLUCOSE 127*  < > 163* 140* 163*  BUN 29*  < > 26* 27* 21*  CREATININE 1.40*  < > 1.28* 1.06* 0.91  CALCIUM 8.3*  < > 8.9 8.2* 8.5*  PROT 5.7*  --   --   --  4.8*  ALBUMIN 2.7*  --   --   --  2.1*  AST 94*  --   --   --  61*  ALT 58*  --   --   --  228*  ALKPHOS 138*  --   --   --  158*  BILITOT 1.0  --   --   --  0.9  GFRNONAA 34*  < > 38* 48* 58*  GFRAA 40*  < > 45* 56* >60  ANIONGAP 17*  < > 10 6 6   < > =  values in this interval not displayed.   Hematology Recent Labs Lab 01/19/17 1245 01/20/17 0431 01/21/17 0229  WBC 7.7 6.1 7.0  RBC 3.88 3.63* 3.70*  HGB 11.5* 10.7* 10.9*  HCT 36.3 33.6* 34.4*  MCV 93.6 92.6 93.0  MCH 29.6 29.5 29.5  MCHC 31.7 31.8 31.7  RDW 15.8* 15.8* 15.9*  PLT 77* 72* 78*    Cardiac Enzymes Recent Labs Lab 01/15/17 2338 01/16/17 0545 01/16/17 1224  TROPONINI 0.38* 0.30* 0.23*   No results for input(s): TROPIPOC in the last 168 hours.   BNP Recent Labs Lab 01/15/17 2338  BNP 841.8*     DDimer No results for input(s): DDIMER in the last 168 hours.   Radiology    Dg Chest Port 1 View  Result Date: 01/20/2017 CLINICAL DATA:  Respiratory failure. EXAM: PORTABLE CHEST 1 VIEW COMPARISON:  01/19/2017. FINDINGS: Interim removal of pulmonary infusion catheters. Stable cardiomegaly. Low lung volumes with basilar subsegmental atelectasis. Small right pleural effusion cannot be excluded. No pneumothorax. Postsurgical changes right shoulder. IMPRESSION: 1. Interim removal of pulmonary infusion catheters. 2. Stable cardiomegaly. 3. Low lung volumes with mild basilar atelectasis . Small right pleural effusion cannot be excluded. Electronically Signed   By: Marcello Moores  Register   On: 01/20/2017 07:05   Dg Chest Port 1 View  Result Date: 01/19/2017 CLINICAL DATA:  82 year old female with acute saddle embolus. Postoperative  day 1 status post catheter directed ultrasound-assisted bilateral pulmonary arterial thrombolysis initiated via R IJ x2. Hemoptysis EXAM: PORTABLE CHEST 1 VIEW COMPARISON:  01/15/2017 portable chest and earlier. FINDINGS: Portable AP semi upright view at 0947 hours. Right IJ approach pulmonary artery infusion catheters are in place. Left chest pacer or resuscitation pads are in place. Stable lung volumes. Stable cardiac size and mediastinal contours. Allowing for portable technique the lungs are clear. IMPRESSION: Right IJ approach pulmonary artery infusion catheters in place, otherwise stable and negative portable radiographic appearance of the chest. Electronically Signed   By: Genevie Ann M.D.   On: 01/19/2017 10:07   Ir Jacolyn Reedy F/u Eval Art/ven Final Day (ms)  Result Date: 01/19/2017 CLINICAL DATA:  Sub massive pulmonary emboli, status post 12+ hour ultrasound assisted catheter directed bilateral pulmonary arterial thrombolytic infusion, complicated only by Mild sporadic hemoptysis. EXAM: IR THROMB F/U EVAL ART/VEN FINAL DAY ANESTHESIA/SEDATION: None used MEDICATIONS: None CONTRAST:  None PROCEDURE: Stable catheter position confirmed on preprocedure chest radiography. Repeat pulmonary arterial pressure measurements obtained bedside . The catheters and sheaths were removed and hemostasis achieved with manual compression. The patient tolerated the procedure well. COMPLICATIONS: None immediate FINDINGS: Postprocedure pulmonary artery pressure 41/15 (25) mmHg. IMPRESSION: 1. Technically successful catheter directed bilateral pulmonary artery ultrasound assisted thrombolytic infusion. Electronically Signed   By: Lucrezia Europe M.D.   On: 01/19/2017 12:04    Cardiac Studies    01/16/2017 LE Doppler: Findings consistent with acute deep vein thrombosis involving theright lower extremity. No evidence of deep vein thrombosis involving the visualizedveins of the left lower extremity.  01/16/2017 ECHO: - Left  ventricle: The cavity size was normal. Systolic function wasnormal. The estimated ejection fraction was in the range of 55%to 60%. Wall motion was normal; there were no regional wallmotion abnormalities. Doppler parameters are consistent withabnormal left ventricular relaxation (grade 1 diastolicdysfunction). - Ventricular septum: Septal motion showed paradox. These changesare consistent with RV-LV interaction. - Left atrium: The atrium was mildly dilated. - Right ventricle: The cavity size was dilated. Wall thickness wasnormal. Systolic function was severely reduced. - Right atrium: The  atrium was moderately dilated. - Tricuspid valve: There was moderate-severe regurgitation. - Pulmonary arteries: Systolic pressure was moderately increased.PA peak pressure: 64 mm Hg (S).   Patient Profile     82 y.o. female with massive saddle PE complicated by RV failure, VT and paroxysmal AFib, improving after EKOS thrombolysis.  Assessment & Plan    1. Acute saddle pulmonary embolism complicated by profound hypotension and syncope, resolved. LE venous duplex was positive for DVT in the right posteriotibial trunk and posterior tibial veins. She is currently on IV Heparin with plan to start oral anticoagulants.  2. Acute RV failure with moderate to severe TR and moderate pulmonary HTN. No longer with low BPs in last 24 hours. Note that sPAP of >60 mm Hg cannot be entirely attributed to the acute event. It suggests preexisting problem such as OSA or previous ignored CTEP disease or just her diastolic HF. 3. New onset atrial fibrillation with RVRand associated wide complex tachycardia worrisome for NSVT, in the setting of acute PE. She continues to have intermittent PVCs, but fewer and fewer. Currently on amiodarone, but will stop this at the time of discharge.  4. Chronic diastolic CHF- Continue to monitor for volume overload as she has been receiving large fluid boluses intermittently to  maintain BP. Physical exam is very challenging due to morbid obesity. Weight is up >11 lb since admission. Will resume low dose diuretics. 5. Elevated troponin is a sign of PE severity. She does have a family history of CAD in her Dad at 52 and her brother had a defibrillator. May consider outpatient workup for CAD after she recovers from PE (in several weeks). 6. Obesity: high risk for PE recurrence, consider lifelong anticoagulation, notwithstanding remote history of diverticular bleeding.  Signed, Sanda Klein, MD  01/21/2017, 9:20 AM

## 2017-01-21 NOTE — Progress Notes (Signed)
ANTICOAGULATION CONSULT NOTE - Follow Up Consult  Pharmacy Consult for Heparin > xarelto Indication: pulmonary embolus  Allergies  Allergen Reactions  . Aspirin Other (See Comments)    GI bleed  . Nsaids Other (See Comments)    GI bleed  . Penicillins Shortness Of Breath and Rash  . Simvastatin Nausea Only  . Vancomycin Rash    Patient Measurements: Height: 5\' 5"  (165.1 cm) Weight: 260 lb 12.9 oz (118.3 kg) IBW/kg (Calculated) : 57 Heparin Dosing Weight: 79 Kg  Vital Signs: Temp: 98.2 F (36.8 C) (03/13 1437) Temp Source: Oral (03/13 1437) BP: 129/56 (03/13 1437) Pulse Rate: 67 (03/13 1437)  Labs:  Recent Labs  01/19/17 1245  01/19/17 2200 01/20/17 0431 01/21/17 0229  HGB 11.5*  --   --  10.7* 10.9*  HCT 36.3  --   --  33.6* 34.4*  PLT 77*  --   --  72* 78*  LABPROT  --   --   --   --  14.6  INR  --   --   --   --  1.14  HEPARINUNFRC  --   < > 0.36 0.43 0.36  CREATININE  --   --   --  1.06* 0.91  < > = values in this interval not displayed.  Estimated Creatinine Clearance: 63.4 mL/min (by C-G formula based on SCr of 0.91 mg/dL).  Assessment: 81 year old female on heparin for extensive bilateral pulmonary embolism with R heart strain and atrial fibrillation. S/p cath directed USAT (alteplase) 3/10-3/11. Pt with a little bit of blood tinged sputum s/p EKOS, following closely - remains mild per RN. Some oozing from IV line earlier today but has resolved. Hg low stable, platelets slightly up to 78 (90 on admission).  Pharmacy consulted to transition to [place]. Renal function appropriate. Noted weight ~118 kg. No further bleeding issues per RN - will continue to monitor closely.  Goal of Therapy:  Heparin level 0.3-0.7 units/ml Monitor platelets by anticoagulation protocol: Yes  Plan: D/c heparin at time of 1st dose of Xarelto - communicated with RN Xarelto 15mg  PO BID with meals x 21 days; then 20mg  PO Qsupper Monitor CBC, s/sx bleeding   Elicia Lamp,  PharmD, BCPS Clinical Pharmacist 01/21/2017 2:41 PM

## 2017-01-21 NOTE — Discharge Instructions (Signed)
Information on my medicine - XARELTO (rivaroxaban)  This medication education was reviewed with me or my healthcare representative as part of my discharge preparation.  WHY WAS XARELTO PRESCRIBED FOR YOU? Xarelto was prescribed to treat blood clots that may have been found in the veins of your legs (deep vein thrombosis) or in your lungs (pulmonary embolism) and to reduce the risk of them occurring again.  What do you need to know about Xarelto? The starting dose is one 15 mg tablet taken TWICE daily with food for the FIRST 21 DAYS then on (enter date)  02/11/2017  the dose is changed to one 20 mg tablet taken ONCE A DAY with your evening meal.  DO NOT stop taking Xarelto without talking to the health care provider who prescribed the medication.  Refill your prescription for 20 mg tablets before you run out.  After discharge, you should have regular check-up appointments with your healthcare provider that is prescribing your Xarelto.  In the future your dose may need to be changed if your kidney function changes by a significant amount.  What do you do if you miss a dose? If you are taking Xarelto TWICE DAILY and you miss a dose, take it as soon as you remember. You may take two 15 mg tablets (total 30 mg) at the same time then resume your regularly scheduled 15 mg twice daily the next day.  If you are taking Xarelto ONCE DAILY and you miss a dose, take it as soon as you remember on the same day then continue your regularly scheduled once daily regimen the next day. Do not take two doses of Xarelto at the same time.   Important Safety Information Xarelto is a blood thinner medicine that can cause bleeding. You should call your healthcare provider right away if you experience any of the following: ? Bleeding from an injury or your nose that does not stop. ? Unusual colored urine (red or dark brown) or unusual colored stools (red or black). ? Unusual bruising for unknown reasons. ? A  serious fall or if you hit your head (even if there is no bleeding).  Some medicines may interact with Xarelto and might increase your risk of bleeding while on Xarelto. To help avoid this, consult your healthcare provider or pharmacist prior to using any new prescription or non-prescription medications, including herbals, vitamins, non-steroidal anti-inflammatory drugs (NSAIDs) and supplements.  This website has more information on Xarelto: [email or web address]

## 2017-01-21 NOTE — Consult Note (Signed)
            [facility] CM Primary Care Navigator  01/21/2017  Jennifer Medina [facility] July 22, 1935 [id number]   Went back to see patient at the bedside to identify possible discharge needs. Patient reports that she had "passed out and fell" at home. Her neighbor saw her and called EMS that had led to this admission.  Patient endorses Dr. Nelda Bucks with [facility] as the primary care provider.    Patient shared using [facility] and [facility] in [place] to obtain medications without any problem.  Patient states managing her own medications at home straight out of the containers.   She reports living alone, independent with self care and able to drive prior to admission. Her sister Blanch Media) will provide transportation to her doctors' appointments.  Patient mentioned that she will stay at her sister's house to recover. Her sister and niece will be her primary caregivers at home as stated.   Discharge plan could possibly be skilled nursing facility for rehabilitation prior to returning home or home with home health services per patient.  Patient voiced understanding to call primary care provider's office, once she returns home, for a post discharge follow-up appointment within a week or sooner if needs arise. Patient letter (with PCP's contact number) was provided as a reminder.  Discussed with patient regarding [facility] CM services available for health management and sheagrees toEMMI HF calls to help her manage HF at home.Patient requests calling her sister's phone number since she plans to stay at her sister's home after discharge.  Referral was made for EMMI HF calls after discharge.   For additional questions please contact:  Edwena Felty A. [place], BSN, RN-BC [facility] PRIMARY CARE Navigator Cell: [phone]

## 2017-01-21 NOTE — Evaluation (Signed)
Physical Therapy Evaluation Patient Details Name: Jennifer Medina MRN: [id number] DOB: [birth year] Today's Date: 01/21/2017   History of Present Illness   Jennifer Medina is a 82 y/o woman admitted with saddle PE, syncope, who developed hypotension and on 3/10 developed another syncopal episode with VT.  Clinical Impression  Pt presents with decreased strength, balance and activity tolerance secondary to above. PTA pt was mod-I with use of RW following recent d/c home from SNF, however, pt now requires  Max assist for all mobility and is still unable to stand. Recommend d/c to SNF for return to mod-I level of function and safe transition home as pt lives alone. Acute PT will follow.     Follow Up Recommendations SNF;Supervision/Assistance - 24 hour    Equipment Recommendations  None recommended by PT    Recommendations for Other Services OT consult     Precautions / Restrictions Precautions Precautions: Fall      Mobility  Bed Mobility Overal bed mobility: Needs Assistance Bed Mobility: Supine to Sit;Sit to Supine;Rolling Rolling: Mod assist   Supine to sit: Mod assist;+2 for physical assistance;HOB elevated Sit to supine: Mod assist   General bed mobility comments: requires assist for LE to EOB and trunk upright, supine BP was 167/63, however, upon sitting up BP 139/78, pt performed LE exercises in sitting and BP rose to 151/70, pt requires verbal cues for breathing technique, physical assist to square hips to EOB,   Transfers Overall transfer level: Needs assistance Equipment used:  (2 person lift with bed pad) Transfers: Sit to/from Stand Sit to Stand: Max assist;+2 physical assistance         General transfer comment: attempted stand x 1 with maxA x 2, however, pt with minimal buttocks clearance from bed and extreme trunk flexion, attempt stand with stedy with maxA x 2 with pt still with minimal buttocks clearance and extreme trunk flexion despite verbal cues    Ambulation/Gait             General Gait Details: NT  Stairs            Wheelchair Mobility    Modified Rankin (Stroke Patients Only)       Balance Overall balance assessment: Needs assistance Sitting-balance support: Feet supported;Single extremity supported Sitting balance-Leahy Scale: Fair Sitting balance - Comments: requires 1 UE to maintain balance seated EOB x 10 min Postural control: Posterior lean Standing balance support: Bilateral upper extremity supported Standing balance-Leahy Scale: Zero Standing balance comment: unable to achieve full upright posture, reliant on physical assist                             Pertinent Vitals/Pain Pain Assessment: 0-10 Pain Score: 5  Pain Location: back Pain Descriptors / Indicators: Discomfort Pain Intervention(s): Limited activity within patient's tolerance;Monitored during session;Repositioned    Home Living Family/patient expects to be discharged to:: Skilled nursing facility Living Arrangements: Alone               Additional Comments: pt lives alone in one story house with 1 step to enter with no rails, neighbor can provide intermittent superivsion, pt admit from home, however, was in [employer] for 16 days in February    Prior Function Level of Independence: Independent with assistive device(s)         Comments: PTA pt was using RW for household ambulation     Hand Dominance   Dominant Hand: Right  Extremity/Trunk Assessment   Upper Extremity Assessment Upper Extremity Assessment: Generalized weakness    Lower Extremity Assessment Lower Extremity Assessment: RLE deficits/detail;LLE deficits/detail RLE Deficits / Details: unable to perform LAQ, decreased ROM secondary to previous injury, able to initiate quad set LLE Deficits / Details: unable to perform LAQ, able to initiate quad set    Cervical / Trunk Assessment Cervical / Trunk Assessment: Kyphotic   Communication   Communication: No difficulties  Cognition Arousal/Alertness: Awake/alert Behavior During Therapy: WFL for tasks assessed/performed;Anxious Overall Cognitive Status: Within Functional Limits for tasks assessed                 General Comments: pt willing to participate,however, was initially fearful of movement    General Comments General comments (skin integrity, edema, etc.): edema in all extremities with ecchymosis in B UE, heparin line in R antecubital region disloged-RN notified    Exercises General Exercises - Lower Extremity Ankle Circles/Pumps: AROM;Both;20 reps;Supine Quad Sets: Strengthening;Both;10 reps;Supine Gluteal Sets: Strengthening;Both;10 reps;Supine   Assessment/Plan    PT Assessment Patient needs continued PT services  PT Problem List Decreased strength;Decreased range of motion;Decreased activity tolerance;Decreased balance;Decreased mobility;Decreased knowledge of use of DME;Pain       PT Treatment Interventions DME instruction;Gait training;Functional mobility training;Therapeutic activities;Therapeutic exercise;Stair training;Balance training;Patient/family education    PT Goals (Current goals can be found in the Care Plan section)  Acute Rehab PT Goals Patient Stated Goal: get better PT Goal Formulation: With patient Time For Goal Achievement: 02/04/17 Potential to Achieve Goals: Fair    Frequency Min 3X/week   Barriers to discharge Decreased caregiver support lives alone with neighbor only able to provide intermittent assist, current level of physical assist required for mobility    Co-evaluation               End of Session Equipment Utilized During Treatment: Gait belt;Oxygen Activity Tolerance:  (Pt limited by difficulty breathing and fatigue) Patient left: in bed;with call bell/phone within reach Nurse Communication: Mobility status;Need for lift equipment;Other (comment) (heparin line bleeding) PT Visit  Diagnosis: Unsteadiness on feet (R26.81);Other abnormalities of gait and mobility (R26.89);Muscle weakness (generalized) (M62.81);History of falling (Z91.81);Difficulty in walking, not elsewhere classified (R26.2);Dizziness and giddiness (R42);Pain Pain - part of body:  (back)         Time: 9147-8295 PT Time Calculation (min) (ACUTE ONLY): 48 min   Charges:   PT Evaluation $PT Eval Moderate Complexity: 1 Procedure PT Treatments $Therapeutic Activity: 23-37 mins   PT G CodesTracie Harrier 02/14/17, 10:54 AM   Tracie Harrier, SPT Acute Rehab SPT [phone]

## 2017-01-21 NOTE — Progress Notes (Signed)
Per Insurance check for [place] will be $242- pt has deductible $195- after that has been met pt copay will be $47

## 2017-01-22 LAB — CBC WITH DIFFERENTIAL/PLATELET
BASOS ABS: 0 10*3/uL (ref 0.0–0.1)
BASOS PCT: 0 %
Eosinophils Absolute: 0 10*3/uL (ref 0.0–0.7)
Eosinophils Relative: 1 %
HEMATOCRIT: 34.8 % — AB (ref 36.0–46.0)
HEMOGLOBIN: 11 g/dL — AB (ref 12.0–15.0)
Lymphocytes Relative: 11 %
Lymphs Abs: 0.8 10*3/uL (ref 0.7–4.0)
MCH: 29.6 pg (ref 26.0–34.0)
MCHC: 31.6 g/dL (ref 30.0–36.0)
MCV: 93.5 fL (ref 78.0–100.0)
MONO ABS: 0.7 10*3/uL (ref 0.1–1.0)
Monocytes Relative: 10 %
NEUTROS ABS: 5.4 10*3/uL (ref 1.7–7.7)
NEUTROS PCT: 78 %
Platelets: 80 10*3/uL — ABNORMAL LOW (ref 150–400)
RBC: 3.72 MIL/uL — ABNORMAL LOW (ref 3.87–5.11)
RDW: 16 % — AB (ref 11.5–15.5)
WBC: 6.9 10*3/uL (ref 4.0–10.5)

## 2017-01-22 LAB — COMPREHENSIVE METABOLIC PANEL
ALBUMIN: 2.1 g/dL — AB (ref 3.5–5.0)
ALT: 160 U/L — AB (ref 14–54)
AST: 33 U/L (ref 15–41)
Alkaline Phosphatase: 150 U/L — ABNORMAL HIGH (ref 38–126)
Anion gap: 4 — ABNORMAL LOW (ref 5–15)
BUN: 18 mg/dL (ref 6–20)
CO2: 29 mmol/L (ref 22–32)
Calcium: 8.5 mg/dL — ABNORMAL LOW (ref 8.9–10.3)
Chloride: 102 mmol/L (ref 101–111)
Creatinine, Ser: 1.13 mg/dL — ABNORMAL HIGH (ref 0.44–1.00)
GFR calc Af Amer: 52 mL/min — ABNORMAL LOW (ref >60)
GFR calc non Af Amer: 45 mL/min — ABNORMAL LOW (ref >60)
GLUCOSE: 197 mg/dL — AB (ref 65–99)
POTASSIUM: 3.8 mmol/L (ref 3.5–5.1)
Sodium: 135 mmol/L (ref 135–145)
TOTAL PROTEIN: 5.1 g/dL — AB (ref 6.5–8.1)
Total Bilirubin: 0.6 mg/dL (ref 0.3–1.2)

## 2017-01-22 LAB — MAGNESIUM: MAGNESIUM: 1.4 mg/dL — AB (ref 1.7–2.4)

## 2017-01-22 LAB — PHOSPHORUS: PHOSPHORUS: 2.5 mg/dL (ref 2.5–4.6)

## 2017-01-22 MED ORDER — MAGNESIUM SULFATE 2 GM/50ML IV SOLN
2.0000 g | Freq: Once | INTRAVENOUS | Status: AC
  Administered 2017-01-22: 2 g via INTRAVENOUS
  Filled 2017-01-22: qty 50

## 2017-01-22 MED ORDER — POTASSIUM CHLORIDE CRYS ER 20 MEQ PO TBCR
20.0000 meq | EXTENDED_RELEASE_TABLET | Freq: Every day | ORAL | Status: DC
  Administered 2017-01-22 – 2017-01-23 (×2): 20 meq via ORAL
  Filled 2017-01-22 (×2): qty 1

## 2017-01-22 NOTE — [place] FL2 (Signed)
[place] MEDICA[place] FL2 LEVEL OF CARE SCREENING TOOL     IDENTIFICATION  Patient Name: Jennifer Medina Birthdate: 1934-12-17 Sex: female Admission Date (Current Location): 01/15/2017  [facility] and [place] Number:  Herbalist and Address:  [facility] [place] [address], [place], [place] [address]      Provider Number: [id number]  Attending Physician Name and Address:  Jonetta Osgood, MD  Relative Name and Phone Number:       Current Level of Care: Hospital Recommended Level of Care: [place] Prior Approval Number:    Date Approved/Denied:   PASRR Number: [id number]  Discharge Plan: SNF    Current Diagnoses: Patient Active Problem List   Diagnosis Date Noted  . SOB (shortness of breath)   . Acute massive pulmonary embolism ([place])   . AKI (acute kidney injury) ([place]) 01/20/2017  . Thrombocytopenia ([place]) 01/20/2017  . Transaminitis 01/20/2017  . Hemoptysis 01/20/2017  . Elevated troponin   . Atrial fibrillation with RVR ([place])   . Wide-complex tachycardia ([place])   . Severe right ventricular systolic dysfunction ([place])   . Saddle embolus of pulmonary artery with acute cor pulmonale (HCC)   . Acute pulmonary embolism ([place]) 01/15/2017  . CHF (congestive heart failure) ([place]) 01/15/2017  . Hyperglycemia 01/15/2017    Orientation RESPIRATION BLADDER Height & Weight     Self, Time, Situation, Place  O2 Incontinent Weight: 260 lb 12.9 oz (118.3 kg) Height:  5\' 5"  (165.1 cm)  BEHAVIORAL SYMPTOMS/MOOD NEUROLOGICAL BOWEL NUTRITION STATUS      Continent Diet (Heart Healthy)  AMBULATORY STATUS COMMUNICATION OF NEEDS Skin   Extensive Assist Verbally Bruising                       Personal Care Assistance Level of Assistance  Bathing, Feeding, Dressing Bathing Assistance: Maximum assistance Feeding assistance: Limited assistance Dressing Assistance: Maximum assistance     Functional Limitations Info  Sight, Hearing,  Speech Sight Info: Adequate Hearing Info: Adequate Speech Info: Adequate    SPECIAL CARE FACTORS FREQUENCY  PT (By licensed PT), OT (By licensed OT)     PT Frequency: 5x week OT Frequency: 5x week            Contractures Contractures Info: Not present    Additional Factors Info  Allergies, Code Status Code Status Info: full code Allergies Info: ASPIRIN, NSAIDS, PENICILLINS, SIMVASTATIN, VANCOMYCIN           Current Medications (01/22/2017):  This is the current hospital active medication list Current Facility-Administered Medications  Medication Dose Route Frequency Provider Last Rate Last Dose  . acetaminophen (TYLENOL) tablet 650 mg  650 mg Oral Q6H PRN Chesley Mires, MD   650 mg at 01/21/17 1002  . amiodarone (PACERONE) tablet 400 mg  400 mg Oral BID Mihai Croitoru, MD   400 mg at 01/22/17 1005  . atorvastatin (LIPITOR) tablet 40 mg  40 mg Oral QHS Chesley Mires, MD   40 mg at 01/21/17 2104  . furosemide (LASIX) tablet 40 mg  40 mg Oral Daily Mihai Croitoru, MD   40 mg at 01/22/17 1005  . latanoprost (XALATAN) 0.005 % ophthalmic solution 1 drop  1 drop Both Eyes QHS Chesley Mires, MD   1 drop at 01/21/17 2104  . LORazepam (ATIVAN) tablet 0.5 mg  0.5 mg Oral Q8H PRN Chesley Mires, MD      . MEDLINE mouth rinse  15 mL Mouth Rinse BID Belva Crome  Ramaswamy, MD   15 mL at 01/22/17 1007  . ondansetron (ZOFRAN) injection 4 mg  4 mg Intravenous Q6H PRN Rise Patience, MD   4 mg at 01/18/17 1737  . pantoprazole (PROTONIX) EC tablet 40 mg  40 mg Oral Daily Chesley Mires, MD   40 mg at 01/22/17 1005  . potassium chloride SA (K-DUR,KLOR-CON) CR tablet 20 mEq  20 mEq Oral Daily Jonetta Osgood, MD   20 mEq at 01/22/17 1005  . Rivaroxaban (XARELTO) tablet 15 mg  15 mg Oral BID WC Romona Curls, RPH   15 mg at 01/22/17 0825   Followed by  . [START ON 02/11/2017] rivaroxaban (XARELTO) tablet 20 mg  20 mg Oral Q supper Romona Curls, RPH      . traMADol Veatrice Bourbon) tablet 50 mg  50 mg Oral Q6H PRN  Chesley Mires, MD   50 mg at 01/21/17 9470     Discharge Medications: Please see discharge summary for a list of discharge medications.  Relevant Imaging Results:  Relevant Lab Results:   Additional Information [id number]  Wende Neighbors, LCSW

## 2017-01-22 NOTE — Clinical Social Work Placement (Signed)
   CLINICAL SOCIAL WORK PLACEMENT  NOTE  Date:  01/22/2017  Patient Details  Name: JANASHA BARKALOW MRN: [id number] Date of Birth: 1935-05-07  Clinical Social Work is seeking post-discharge placement for this patient at the [place] level of care (*CSW will initial, date and re-position this form in  chart as items are completed):  Yes   Patient/family provided with [facility] Work Department's list of facilities offering this level of care within the geographic area requested by the patient (or if unable, by the patient's family).  Yes   Patient/family informed of their freedom to choose among providers that offer the needed level of care, that participate in Medicare, Medicaid or managed care program needed by the patient, have an available bed and are willing to accept the patient.  Yes   Patient/family informed of [facility]'s ownership interest in [facility] and [facility], as well as of the fact that they are under no obligation to receive care at these facilities.  PASRR submitted to EDS on       PASRR number received on       Existing PASRR number confirmed on       FL2 transmitted to all facilities in geographic area requested by pt/family on       FL2 transmitted to all facilities within larger geographic area on       Patient informed that his/her managed care company has contracts with or will negotiate with certain facilities, including the following:            Patient/family informed of bed offers received.  Patient chooses bed at       Physician recommends and patient chooses bed at      Patient to be transferred to   on  .  Patient to be transferred to facility by       Patient family notified on   of transfer.  Name of family member notified:        PHYSICIAN Please sign FL2     Additional Comment:    _______________________________________________ Wende Neighbors, LCSW 01/22/2017, 4:15 PM

## 2017-01-22 NOTE — Progress Notes (Signed)
PROGRESS NOTE        PATIENT DETAILS Name: Jennifer Medina Age: 82 y.o. Sex: female Date of Birth: 1935/10/29 Admit Date: 01/15/2017 Admitting Physician Rigoberto Noel, MD PCP:No primary care provider on file.  Brief Narrative: Patient is a 82 y.o. female chronic diastolic heart failure who was admitted on 3/7 for a submassive pulmonary embolism, she was initially started on IV heparin, however she subsequently became hemodynamically unstable, required catheter guided thrombolytic therapy. She was managed by [facility] and transferred to the [facility] hospitalist service on 3/13, she remains on IV heparin. See below for further details  Subjective: Lying comfortably in bed-denies any epistaxis. Still having symptoms trace hemoptysis.  Assessment/Plan: Acute submassive pulmonary embolism: Initially tolerated on IV heparin, but became hemodynamically unstable requiring catheter-related thrombolytic therapy. She was subsequently maintained on IV heparin, she has now been switched to Xarelto. Will require further hypercoagulable workup and age-appropriate cancer screening workup as an outpatient.  Right lower extremity DVT: On anticoagulation-see above.  A. fib with RVR: Cardiology following, on amiodarone-on anticoagulation as above.CHA2DSVASSC score ot aleast 3.  Elevated troponins: Likely secondary to PE, cardiology planning on outpatient workup after patient recovers from PE.  Hemoptysis: Minimum-probably secondary to PE-continue anticoagulation.  Chronic diastolic CHF: Difficult exam to assess for volume overload-but home weight is up, continue oral diuretics-follow weights.  Thrombocytopenia: Suspect platelet consumption due to massive clot burden, continue to follow.  Acute kidney injury: Resolved, likely hemodynamically mediated kidney injury.  Elevated AST/ALT: Could be remnant of shock liver-plans are to follow. Benign abdominal exam. If LFTs do not improve,  then will plan on further workup-slowly down trending.  DVT Prophylaxis: Full dose anticoagulation with  Xarelto  Code Status: Full code  Family Communication: None at bedside  Disposition Plan: Remain inpatient-probably will require SNF on discharge-likely on 3/15  Antimicrobial agents: Anti-infectives    None     Procedures: Echo 3/13>> - Left ventricle: The cavity size was normal. Systolic function was   normal. The estimated ejection fraction was in the range of 55%   to 60%. Wall motion was normal; there were no regional wall   motion abnormalities. Doppler parameters are consistent with   abnormal left ventricular relaxation (grade 1 diastolic   dysfunction). - Ventricular septum: Septal motion showed paradox. These changes   are consistent with RV-LV interaction. - Left atrium: The atrium was mildly dilated. - Right ventricle: The cavity size was dilated. Wall thickness was   normal. Systolic function was severely reduced. - Right atrium: The atrium was moderately dilated. - Tricuspid valve: There was moderate-severe regurgitation. - Pulmonary arteries: Systolic pressure was moderately increased.   PA peak pressure: 64 mm Hg (S).  CONSULTS:  cardiology and pulmonary/intensive care  Time spent: 25- minutes-Greater than 50% of this time was spent in counseling, explanation of diagnosis, planning of further management, and coordination of care.  MEDICATIONS: Scheduled Meds: . amiodarone  400 mg Oral BID  . atorvastatin  40 mg Oral QHS  . furosemide  40 mg Oral Daily  . latanoprost  1 drop Both Eyes QHS  . mouth rinse  15 mL Mouth Rinse BID  . pantoprazole  40 mg Oral Daily  . potassium chloride  20 mEq Oral Daily  . rivaroxaban  15 mg Oral BID WC   Followed by  . [START ON 02/11/2017] rivaroxaban  20 mg Oral Q supper   Continuous Infusions:  PRN Meds:.acetaminophen, LORazepam, [DISCONTINUED] ondansetron **OR** ondansetron (ZOFRAN) IV, traMADol   PHYSICAL  EXAM: Vital signs: Vitals:   01/21/17 0517 01/21/17 1437 01/21/17 2021 01/22/17 0404  BP: (!) 118/54 (!) 129/56 130/62 122/63  Pulse: 63 67 68 67  Resp: 18 18 18 18   Temp: 97.9 F (36.6 C) 98.2 F (36.8 C) 98.1 F (36.7 C) 98.3 F (36.8 C)  TempSrc: Oral Oral Oral Oral  SpO2: 98% 97% 96% 95%  Weight:      Height:       Filed Weights   01/18/17 0447 01/18/17 1815 01/20/17 1514  Weight: 110.5 kg (243 lb 9.6 oz) 113.3 kg (249 lb 12.5 oz) 118.3 kg (260 lb 12.9 oz)   Body mass index is 43.4 kg/m.   General appearance :Awake, alert, not in any distress. Speech Clear. Eyes:, pupils equally reactive to light and accomodation,no scleral icterus. HEENT: Atraumatic and Normocephalic Neck: supple, no JVD. No cervical lymphadenopathy. Resp:Good air entry bilaterally, no added sounds  CVS: S1 S2 regular, no murmurs.  GI: Bowel sounds present, Non tender and not distended with no gaurding, rigidity or rebound. Extremities: B/L Lower Ext shows no edema, both legs are warm to touch Neurology:  speech clear,Non focal, sensation is grossly intact. Musculoskeletal:No digital cyanosis Skin:No Rash, warm and dry Wounds:N/A  I have personally reviewed following labs and imaging studies  LABORATORY DATA: CBC:  Recent Labs Lab 01/15/17 2338  01/19/17 0549 01/19/17 1245 01/20/17 0431 01/21/17 0229 01/22/17 0138  WBC 8.7  < > 8.4 7.7 6.1 7.0 6.9  NEUTROABS 6.0  --   --   --   --  5.6 5.4  HGB 13.8  < > 11.9* 11.5* 10.7* 10.9* 11.0*  HCT 43.3  < > 37.9 36.3 33.6* 34.4* 34.8*  MCV 95.2  < > 93.6 93.6 92.6 93.0 93.5  PLT 90*  < > 75* 77* 72* 78* 80*  < > = values in this interval not displayed.  Basic Metabolic Panel:  Recent Labs Lab 01/17/17 0529 01/18/17 0356 01/18/17 2101 01/20/17 0431 01/21/17 0229 01/22/17 0138  NA 138 135  --  132* 136 135  K 3.9 4.4  --  3.9 4.1 3.8  CL 107 102  --  103 105 102  CO2 20* 23  --  23 25 29   GLUCOSE 120* 163*  --  140* 163* 197*  BUN  23* 26*  --  27* 21* 18  CREATININE 1.13* 1.28*  --  1.06* 0.91 1.13*  CALCIUM 8.6* 8.9  --  8.2* 8.5* 8.5*  MG  --   --  1.7  --  1.7 1.4*  PHOS  --   --   --   --  2.4* 2.5    GFR: Estimated Creatinine Clearance: 51.1 mL/min (by C-G formula based on SCr of 1.13 mg/dL (H)).  Liver Function Tests:  Recent Labs Lab 01/15/17 2338 01/21/17 0229 01/22/17 0138  AST 94* 61* 33  ALT 58* 228* 160*  ALKPHOS 138* 158* 150*  BILITOT 1.0 0.9 0.6  PROT 5.7* 4.8* 5.1*  ALBUMIN 2.7* 2.1* 2.1*   No results for input(s): LIPASE, AMYLASE in the last 168 hours. No results for input(s): AMMONIA in the last 168 hours.  Coagulation Profile:  Recent Labs Lab 01/16/17 0003 01/21/17 0229  INR 1.20 1.14    Cardiac Enzymes:  Recent Labs Lab 01/15/17 2338 01/16/17 0545 01/16/17 1224  TROPONINI 0.38* 0.30* 0.23*  BNP (last 3 results) No results for input(s): PROBNP in the last 8760 hours.  HbA1C: No results for input(s): HGBA1C in the last 72 hours.  CBG:  Recent Labs Lab 01/16/17 0115 01/16/17 0336 01/16/17 0753 01/16/17 1125 01/16/17 1538  GLUCAP 127* 142* 115* 112* 133*    Lipid Profile: No results for input(s): CHOL, HDL, LDLCALC, TRIG, CHOLHDL, LDLDIRECT in the last 72 hours.  Thyroid Function Tests: No results for input(s): TSH, T4TOTAL, FREET4, T3FREE, THYROIDAB in the last 72 hours.  Anemia Panel: No results for input(s): VITAMINB12, FOLATE, FERRITIN, TIBC, IRON, RETICCTPCT in the last 72 hours.  Urine analysis:    Component Value Date/Time   COLORURINE AMBER (A) 01/16/2017 0303   APPEARANCEUR HAZY (A) 01/16/2017 0303   LABSPEC >1.046 (H) 01/16/2017 0303   PHURINE 5.0 01/16/2017 0303   GLUCOSEU NEGATIVE 01/16/2017 0303   HGBUR LARGE (A) 01/16/2017 0303   BILIRUBINUR NEGATIVE 01/16/2017 0303   KETONESUR NEGATIVE 01/16/2017 0303   PROTEINUR 100 (A) 01/16/2017 0303   NITRITE NEGATIVE 01/16/2017 0303   LEUKOCYTESUR NEGATIVE 01/16/2017 0303    Sepsis  Labs: Lactic Acid, Venous    Component Value Date/Time   LATICACIDVEN 2.0 ([place]) 01/16/2017 0545    MICROBIOLOGY: Recent Results (from the past 240 hour(s))  MRSA PCR Screening     Status: None   Collection Time: 01/16/17  1:18 AM  Result Value Ref Range Status   MRSA by PCR NEGATIVE NEGATIVE Final    Comment:        The GeneXpert MRSA Assay (FDA approved for NASAL specimens only), is one component of a comprehensive MRSA colonization surveillance program. It is not intended to diagnose MRSA infection nor to guide or monitor treatment for MRSA infections.     RADIOLOGY STUDIES/RESULTS: Ir Angiogram Pulmonary Bilateral Selective  Result Date: 01/19/2017 INDICATION: Acute bilateral pulmonary emboli. Recent new cardiac arrhythmia and progressive right heart strain. EXAM: 1. ULTRASOUND GUIDANCE FOR VENOUS ACCESS X2 2. DIRECT PULMONARY ARTERY PRESSURE MEASUREMENTS 3. FLUOROSCOPIC GUIDED PLACEMENT OF BILATERAL PULMONARY ARTERIAL LYTIC INFUSION CATHETERS COMPARISON:  Chest CTA - 01/15/2017 MEDICATIONS: Intravenous Fentanyl and Versed were administered as conscious sedation during continuous monitoring of the patient's level of consciousness and physiological / cardiorespiratory status by the radiology RN, with a total moderate sedation time of 43minutes. CONTRAST:  None used FLUOROSCOPY TIME:  6.9 min  (82  uGym2 DAP) COMPLICATIONS: None immediate TECHNIQUE: Informed written consent was obtained from the patient after a discussion of the risks, benefits and alternatives to treatment. Questions regarding the procedure were encouraged and answered. A timeout was performed prior to the initiation of the procedure. Previous ultrasound had demonstrated right lower extremity DVT. Ultrasound scanning was performed of the right IJ vein demonstrating widely patent. As such, the right internal jugular vein was selected for vascular access. The right neck was prepped and draped in the usual sterile fashion,  and a sterile drape was applied covering the operative field. Maximum barrier sterile technique with sterile gowns and gloves were used for the procedure. A timeout was performed prior to the initiation of the procedure. Local anesthesia was provided with 1% lidocaine. Under direct ultrasound guidance, the right internal jugular vein was accessed with a micro puncture sheath ultimately allowing placement of a 6 French vascular sheath. With the use of a glidewire, an angled pigtail catheter was advanced into the right main pulmonary artery and Pressure measurements were then obtained from the right pulmonary artery. Over a wire, the pigtail catheter was exchanged for  a 105/18 cm multi side-hole EKOS ultrasound assisted infusion catheter. Slightly cranial to this initial access, the right internal jugular vein was again accessed with a micropuncture sheath ultimately allowing placement of a 6 French vascular sheath. With the use of a glidewire, a pigtail catheter was advanced into the left main pulmonary artery . Over an exchange length wire, the pigtail catheter was exchanged for a 105/12 cm multi side-hole EKOS ultrasound assisted infusion catheter. A postprocedural fluoroscopic image was obtained of the check demonstrating final catheter positioning. Both vascular sheath were secured at the right neck with interrupted 0 Prolene suture. The external catheter tubing was secured at the right chest and the lytic therapy was initiated. The patient tolerated the procedure well without immediate postprocedural complication. FINDINGS: Directly recorded proximal right pulmonary artery pressure measurements: 46/10 (23) mmHg (normal: < 25/10) Following the procedure, both ultrasound assisted infusion catheter tips terminate within the distal aspects of the bilateral lower lobe sub segmental pulmonary arteries. IMPRESSION: 1. Successful fluoroscopic guided initiation of bilateral ultrasound assisted catheter directed pulmonary  arterial lysis for sub massive pulmonary embolism and right-sided heart strain. 2. Markedly elevated pressure measurements within the right main pulmonary artery compatible with critical pulmonary arterial hypertension. PLAN: - The patient will be assessed approximately 12 hours following the initiation of the catheter directed pulmonary arterial lysis for repeat pressure measurements and either removal of the catheters or continuation of the catheter directed thrombolysis. Electronically Signed   By: Lucrezia Europe M.D.   On: 01/19/2017 12:17   Ir Angiogram Selective Each Additional Vessel  Result Date: 01/19/2017 INDICATION: Acute bilateral pulmonary emboli. Recent new cardiac arrhythmia and progressive right heart strain. EXAM: 1. ULTRASOUND GUIDANCE FOR VENOUS ACCESS X2 2. DIRECT PULMONARY ARTERY PRESSURE MEASUREMENTS 3. FLUOROSCOPIC GUIDED PLACEMENT OF BILATERAL PULMONARY ARTERIAL LYTIC INFUSION CATHETERS COMPARISON:  Chest CTA - 01/15/2017 MEDICATIONS: Intravenous Fentanyl and Versed were administered as conscious sedation during continuous monitoring of the patient's level of consciousness and physiological / cardiorespiratory status by the radiology RN, with a total moderate sedation time of 47minutes. CONTRAST:  None used FLUOROSCOPY TIME:  6.9 min  (82  uGym2 DAP) COMPLICATIONS: None immediate TECHNIQUE: Informed written consent was obtained from the patient after a discussion of the risks, benefits and alternatives to treatment. Questions regarding the procedure were encouraged and answered. A timeout was performed prior to the initiation of the procedure. Previous ultrasound had demonstrated right lower extremity DVT. Ultrasound scanning was performed of the right IJ vein demonstrating widely patent. As such, the right internal jugular vein was selected for vascular access. The right neck was prepped and draped in the usual sterile fashion, and a sterile drape was applied covering the operative field.  Maximum barrier sterile technique with sterile gowns and gloves were used for the procedure. A timeout was performed prior to the initiation of the procedure. Local anesthesia was provided with 1% lidocaine. Under direct ultrasound guidance, the right internal jugular vein was accessed with a micro puncture sheath ultimately allowing placement of a 6 French vascular sheath. With the use of a glidewire, an angled pigtail catheter was advanced into the right main pulmonary artery and Pressure measurements were then obtained from the right pulmonary artery. Over a wire, the pigtail catheter was exchanged for a 105/18 cm multi side-hole EKOS ultrasound assisted infusion catheter. Slightly cranial to this initial access, the right internal jugular vein was again accessed with a micropuncture sheath ultimately allowing placement of a 6 French vascular sheath. With the use of  a glidewire, a pigtail catheter was advanced into the left main pulmonary artery . Over an exchange length wire, the pigtail catheter was exchanged for a 105/12 cm multi side-hole EKOS ultrasound assisted infusion catheter. A postprocedural fluoroscopic image was obtained of the check demonstrating final catheter positioning. Both vascular sheath were secured at the right neck with interrupted 0 Prolene suture. The external catheter tubing was secured at the right chest and the lytic therapy was initiated. The patient tolerated the procedure well without immediate postprocedural complication. FINDINGS: Directly recorded proximal right pulmonary artery pressure measurements: 46/10 (23) mmHg (normal: < 25/10) Following the procedure, both ultrasound assisted infusion catheter tips terminate within the distal aspects of the bilateral lower lobe sub segmental pulmonary arteries. IMPRESSION: 1. Successful fluoroscopic guided initiation of bilateral ultrasound assisted catheter directed pulmonary arterial lysis for sub massive pulmonary embolism and  right-sided heart strain. 2. Markedly elevated pressure measurements within the right main pulmonary artery compatible with critical pulmonary arterial hypertension. PLAN: - The patient will be assessed approximately 12 hours following the initiation of the catheter directed pulmonary arterial lysis for repeat pressure measurements and either removal of the catheters or continuation of the catheter directed thrombolysis. Electronically Signed   By: Lucrezia Europe M.D.   On: 01/19/2017 12:17   Ir Angiogram Selective Each Additional Vessel  Result Date: 01/19/2017 INDICATION: Acute bilateral pulmonary emboli. Recent new cardiac arrhythmia and progressive right heart strain. EXAM: 1. ULTRASOUND GUIDANCE FOR VENOUS ACCESS X2 2. DIRECT PULMONARY ARTERY PRESSURE MEASUREMENTS 3. FLUOROSCOPIC GUIDED PLACEMENT OF BILATERAL PULMONARY ARTERIAL LYTIC INFUSION CATHETERS COMPARISON:  Chest CTA - 01/15/2017 MEDICATIONS: Intravenous Fentanyl and Versed were administered as conscious sedation during continuous monitoring of the patient's level of consciousness and physiological / cardiorespiratory status by the radiology RN, with a total moderate sedation time of 37minutes. CONTRAST:  None used FLUOROSCOPY TIME:  6.9 min  (82  uGym2 DAP) COMPLICATIONS: None immediate TECHNIQUE: Informed written consent was obtained from the patient after a discussion of the risks, benefits and alternatives to treatment. Questions regarding the procedure were encouraged and answered. A timeout was performed prior to the initiation of the procedure. Previous ultrasound had demonstrated right lower extremity DVT. Ultrasound scanning was performed of the right IJ vein demonstrating widely patent. As such, the right internal jugular vein was selected for vascular access. The right neck was prepped and draped in the usual sterile fashion, and a sterile drape was applied covering the operative field. Maximum barrier sterile technique with sterile gowns and  gloves were used for the procedure. A timeout was performed prior to the initiation of the procedure. Local anesthesia was provided with 1% lidocaine. Under direct ultrasound guidance, the right internal jugular vein was accessed with a micro puncture sheath ultimately allowing placement of a 6 French vascular sheath. With the use of a glidewire, an angled pigtail catheter was advanced into the right main pulmonary artery and Pressure measurements were then obtained from the right pulmonary artery. Over a wire, the pigtail catheter was exchanged for a 105/18 cm multi side-hole EKOS ultrasound assisted infusion catheter. Slightly cranial to this initial access, the right internal jugular vein was again accessed with a micropuncture sheath ultimately allowing placement of a 6 French vascular sheath. With the use of a glidewire, a pigtail catheter was advanced into the left main pulmonary artery . Over an exchange length wire, the pigtail catheter was exchanged for a 105/12 cm multi side-hole EKOS ultrasound assisted infusion catheter. A postprocedural fluoroscopic image was obtained  of the check demonstrating final catheter positioning. Both vascular sheath were secured at the right neck with interrupted 0 Prolene suture. The external catheter tubing was secured at the right chest and the lytic therapy was initiated. The patient tolerated the procedure well without immediate postprocedural complication. FINDINGS: Directly recorded proximal right pulmonary artery pressure measurements: 46/10 (23) mmHg (normal: < 25/10) Following the procedure, both ultrasound assisted infusion catheter tips terminate within the distal aspects of the bilateral lower lobe sub segmental pulmonary arteries. IMPRESSION: 1. Successful fluoroscopic guided initiation of bilateral ultrasound assisted catheter directed pulmonary arterial lysis for sub massive pulmonary embolism and right-sided heart strain. 2. Markedly elevated pressure  measurements within the right main pulmonary artery compatible with critical pulmonary arterial hypertension. PLAN: - The patient will be assessed approximately 12 hours following the initiation of the catheter directed pulmonary arterial lysis for repeat pressure measurements and either removal of the catheters or continuation of the catheter directed thrombolysis. Electronically Signed   By: Lucrezia Europe M.D.   On: 01/19/2017 12:17   Ir US Guide Vasc Access Right  Result Date: 01/19/2017 INDICATION: Acute bilateral pulmonary emboli. Recent new cardiac arrhythmia and progressive right heart strain. EXAM: 1. ULTRASOUND GUIDANCE FOR VENOUS ACCESS X2 2. DIRECT PULMONARY ARTERY PRESSURE MEASUREMENTS 3. FLUOROSCOPIC GUIDED PLACEMENT OF BILATERAL PULMONARY ARTERIAL LYTIC INFUSION CATHETERS COMPARISON:  Chest CTA - 01/15/2017 MEDICATIONS: Intravenous Fentanyl and Versed were administered as conscious sedation during continuous monitoring of the patient's level of consciousness and physiological / cardiorespiratory status by the radiology RN, with a total moderate sedation time of 70minutes. CONTRAST:  None used FLUOROSCOPY TIME:  6.9 min  (82  uGym2 DAP) COMPLICATIONS: None immediate TECHNIQUE: Informed written consent was obtained from the patient after a discussion of the risks, benefits and alternatives to treatment. Questions regarding the procedure were encouraged and answered. A timeout was performed prior to the initiation of the procedure. Previous ultrasound had demonstrated right lower extremity DVT. Ultrasound scanning was performed of the right IJ vein demonstrating widely patent. As such, the right internal jugular vein was selected for vascular access. The right neck was prepped and draped in the usual sterile fashion, and a sterile drape was applied covering the operative field. Maximum barrier sterile technique with sterile gowns and gloves were used for the procedure. A timeout was performed prior to  the initiation of the procedure. Local anesthesia was provided with 1% lidocaine. Under direct ultrasound guidance, the right internal jugular vein was accessed with a micro puncture sheath ultimately allowing placement of a 6 French vascular sheath. With the use of a glidewire, an angled pigtail catheter was advanced into the right main pulmonary artery and Pressure measurements were then obtained from the right pulmonary artery. Over a wire, the pigtail catheter was exchanged for a 105/18 cm multi side-hole EKOS ultrasound assisted infusion catheter. Slightly cranial to this initial access, the right internal jugular vein was again accessed with a micropuncture sheath ultimately allowing placement of a 6 French vascular sheath. With the use of a glidewire, a pigtail catheter was advanced into the left main pulmonary artery . Over an exchange length wire, the pigtail catheter was exchanged for a 105/12 cm multi side-hole EKOS ultrasound assisted infusion catheter. A postprocedural fluoroscopic image was obtained of the check demonstrating final catheter positioning. Both vascular sheath were secured at the right neck with interrupted 0 Prolene suture. The external catheter tubing was secured at the right chest and the lytic therapy was initiated. The patient tolerated the  procedure well without immediate postprocedural complication. FINDINGS: Directly recorded proximal right pulmonary artery pressure measurements: 46/10 (23) mmHg (normal: < 25/10) Following the procedure, both ultrasound assisted infusion catheter tips terminate within the distal aspects of the bilateral lower lobe sub segmental pulmonary arteries. IMPRESSION: 1. Successful fluoroscopic guided initiation of bilateral ultrasound assisted catheter directed pulmonary arterial lysis for sub massive pulmonary embolism and right-sided heart strain. 2. Markedly elevated pressure measurements within the right main pulmonary artery compatible with critical  pulmonary arterial hypertension. PLAN: - The patient will be assessed approximately 12 hours following the initiation of the catheter directed pulmonary arterial lysis for repeat pressure measurements and either removal of the catheters or continuation of the catheter directed thrombolysis. Electronically Signed   By: Lucrezia Europe M.D.   On: 01/19/2017 12:17   Dg Chest Port 1 View  Result Date: 01/20/2017 CLINICAL DATA:  Respiratory failure. EXAM: PORTABLE CHEST 1 VIEW COMPARISON:  01/19/2017. FINDINGS: Interim removal of pulmonary infusion catheters. Stable cardiomegaly. Low lung volumes with basilar subsegmental atelectasis. Small right pleural effusion cannot be excluded. No pneumothorax. Postsurgical changes right shoulder. IMPRESSION: 1. Interim removal of pulmonary infusion catheters. 2. Stable cardiomegaly. 3. Low lung volumes with mild basilar atelectasis . Small right pleural effusion cannot be excluded. Electronically Signed   By: Marcello Moores  Register   On: 01/20/2017 07:05   Dg Chest Port 1 View  Result Date: 01/19/2017 CLINICAL DATA:  82 year old female with acute saddle embolus. Postoperative day 1 status post catheter directed ultrasound-assisted bilateral pulmonary arterial thrombolysis initiated via R IJ x2. Hemoptysis EXAM: PORTABLE CHEST 1 VIEW COMPARISON:  01/15/2017 portable chest and earlier. FINDINGS: Portable AP semi upright view at 0947 hours. Right IJ approach pulmonary artery infusion catheters are in place. Left chest pacer or resuscitation pads are in place. Stable lung volumes. Stable cardiac size and mediastinal contours. Allowing for portable technique the lungs are clear. IMPRESSION: Right IJ approach pulmonary artery infusion catheters in place, otherwise stable and negative portable radiographic appearance of the chest. Electronically Signed   By: Genevie Ann M.D.   On: 01/19/2017 10:07   Dg Chest Port 1 View  Result Date: 01/15/2017 CLINICAL DATA:  Shortness of breath EXAM:  PORTABLE CHEST 1 VIEW COMPARISON:  01/15/2017, FINDINGS: No acute infiltrate or effusion. Stable cardiomediastinal silhouette. No pneumothorax. IMPRESSION: Stable borderline to mild cardiomegaly.  No edema.  No infiltrate. Electronically Signed   By: Donavan Foil M.D.   On: 01/15/2017 23:38   Ir Infusion Thrombol Arterial Initial (ms)  Result Date: 01/19/2017 INDICATION: Acute bilateral pulmonary emboli. Recent new cardiac arrhythmia and progressive right heart strain. EXAM: 1. ULTRASOUND GUIDANCE FOR VENOUS ACCESS X2 2. DIRECT PULMONARY ARTERY PRESSURE MEASUREMENTS 3. FLUOROSCOPIC GUIDED PLACEMENT OF BILATERAL PULMONARY ARTERIAL LYTIC INFUSION CATHETERS COMPARISON:  Chest CTA - 01/15/2017 MEDICATIONS: Intravenous Fentanyl and Versed were administered as conscious sedation during continuous monitoring of the patient's level of consciousness and physiological / cardiorespiratory status by the radiology RN, with a total moderate sedation time of 65minutes. CONTRAST:  None used FLUOROSCOPY TIME:  6.9 min  (82  uGym2 DAP) COMPLICATIONS: None immediate TECHNIQUE: Informed written consent was obtained from the patient after a discussion of the risks, benefits and alternatives to treatment. Questions regarding the procedure were encouraged and answered. A timeout was performed prior to the initiation of the procedure. Previous ultrasound had demonstrated right lower extremity DVT. Ultrasound scanning was performed of the right IJ vein demonstrating widely patent. As such, the right internal jugular vein was selected  for vascular access. The right neck was prepped and draped in the usual sterile fashion, and a sterile drape was applied covering the operative field. Maximum barrier sterile technique with sterile gowns and gloves were used for the procedure. A timeout was performed prior to the initiation of the procedure. Local anesthesia was provided with 1% lidocaine. Under direct ultrasound guidance, the right  internal jugular vein was accessed with a micro puncture sheath ultimately allowing placement of a 6 French vascular sheath. With the use of a glidewire, an angled pigtail catheter was advanced into the right main pulmonary artery and Pressure measurements were then obtained from the right pulmonary artery. Over a wire, the pigtail catheter was exchanged for a 105/18 cm multi side-hole EKOS ultrasound assisted infusion catheter. Slightly cranial to this initial access, the right internal jugular vein was again accessed with a micropuncture sheath ultimately allowing placement of a 6 French vascular sheath. With the use of a glidewire, a pigtail catheter was advanced into the left main pulmonary artery . Over an exchange length wire, the pigtail catheter was exchanged for a 105/12 cm multi side-hole EKOS ultrasound assisted infusion catheter. A postprocedural fluoroscopic image was obtained of the check demonstrating final catheter positioning. Both vascular sheath were secured at the right neck with interrupted 0 Prolene suture. The external catheter tubing was secured at the right chest and the lytic therapy was initiated. The patient tolerated the procedure well without immediate postprocedural complication. FINDINGS: Directly recorded proximal right pulmonary artery pressure measurements: 46/10 (23) mmHg (normal: < 25/10) Following the procedure, both ultrasound assisted infusion catheter tips terminate within the distal aspects of the bilateral lower lobe sub segmental pulmonary arteries. IMPRESSION: 1. Successful fluoroscopic guided initiation of bilateral ultrasound assisted catheter directed pulmonary arterial lysis for sub massive pulmonary embolism and right-sided heart strain. 2. Markedly elevated pressure measurements within the right main pulmonary artery compatible with critical pulmonary arterial hypertension. PLAN: - The patient will be assessed approximately 12 hours following the initiation of the  catheter directed pulmonary arterial lysis for repeat pressure measurements and either removal of the catheters or continuation of the catheter directed thrombolysis. Electronically Signed   By: Lucrezia Europe M.D.   On: 01/19/2017 12:17   Ir Infusion Thrombol Arterial Initial (ms)  Result Date: 01/19/2017 INDICATION: Acute bilateral pulmonary emboli. Recent new cardiac arrhythmia and progressive right heart strain. EXAM: 1. ULTRASOUND GUIDANCE FOR VENOUS ACCESS X2 2. DIRECT PULMONARY ARTERY PRESSURE MEASUREMENTS 3. FLUOROSCOPIC GUIDED PLACEMENT OF BILATERAL PULMONARY ARTERIAL LYTIC INFUSION CATHETERS COMPARISON:  Chest CTA - 01/15/2017 MEDICATIONS: Intravenous Fentanyl and Versed were administered as conscious sedation during continuous monitoring of the patient's level of consciousness and physiological / cardiorespiratory status by the radiology RN, with a total moderate sedation time of 81minutes. CONTRAST:  None used FLUOROSCOPY TIME:  6.9 min  (82  uGym2 DAP) COMPLICATIONS: None immediate TECHNIQUE: Informed written consent was obtained from the patient after a discussion of the risks, benefits and alternatives to treatment. Questions regarding the procedure were encouraged and answered. A timeout was performed prior to the initiation of the procedure. Previous ultrasound had demonstrated right lower extremity DVT. Ultrasound scanning was performed of the right IJ vein demonstrating widely patent. As such, the right internal jugular vein was selected for vascular access. The right neck was prepped and draped in the usual sterile fashion, and a sterile drape was applied covering the operative field. Maximum barrier sterile technique with sterile gowns and gloves were used for the procedure. A timeout  was performed prior to the initiation of the procedure. Local anesthesia was provided with 1% lidocaine. Under direct ultrasound guidance, the right internal jugular vein was accessed with a micro puncture sheath  ultimately allowing placement of a 6 French vascular sheath. With the use of a glidewire, an angled pigtail catheter was advanced into the right main pulmonary artery and Pressure measurements were then obtained from the right pulmonary artery. Over a wire, the pigtail catheter was exchanged for a 105/18 cm multi side-hole EKOS ultrasound assisted infusion catheter. Slightly cranial to this initial access, the right internal jugular vein was again accessed with a micropuncture sheath ultimately allowing placement of a 6 French vascular sheath. With the use of a glidewire, a pigtail catheter was advanced into the left main pulmonary artery . Over an exchange length wire, the pigtail catheter was exchanged for a 105/12 cm multi side-hole EKOS ultrasound assisted infusion catheter. A postprocedural fluoroscopic image was obtained of the check demonstrating final catheter positioning. Both vascular sheath were secured at the right neck with interrupted 0 Prolene suture. The external catheter tubing was secured at the right chest and the lytic therapy was initiated. The patient tolerated the procedure well without immediate postprocedural complication. FINDINGS: Directly recorded proximal right pulmonary artery pressure measurements: 46/10 (23) mmHg (normal: < 25/10) Following the procedure, both ultrasound assisted infusion catheter tips terminate within the distal aspects of the bilateral lower lobe sub segmental pulmonary arteries. IMPRESSION: 1. Successful fluoroscopic guided initiation of bilateral ultrasound assisted catheter directed pulmonary arterial lysis for sub massive pulmonary embolism and right-sided heart strain. 2. Markedly elevated pressure measurements within the right main pulmonary artery compatible with critical pulmonary arterial hypertension. PLAN: - The patient will be assessed approximately 12 hours following the initiation of the catheter directed pulmonary arterial lysis for repeat pressure  measurements and either removal of the catheters or continuation of the catheter directed thrombolysis. Electronically Signed   By: Lucrezia Europe M.D.   On: 01/19/2017 12:17   Ir Jacolyn Reedy F/u Eval Art/ven Final Day (ms)  Result Date: 01/19/2017 CLINICAL DATA:  Sub massive pulmonary emboli, status post 12+ hour ultrasound assisted catheter directed bilateral pulmonary arterial thrombolytic infusion, complicated only by Mild sporadic hemoptysis. EXAM: IR THROMB F/U EVAL ART/VEN FINAL DAY ANESTHESIA/SEDATION: None used MEDICATIONS: None CONTRAST:  None PROCEDURE: Stable catheter position confirmed on preprocedure chest radiography. Repeat pulmonary arterial pressure measurements obtained bedside . The catheters and sheaths were removed and hemostasis achieved with manual compression. The patient tolerated the procedure well. COMPLICATIONS: None immediate FINDINGS: Postprocedure pulmonary artery pressure 41/15 (25) mmHg. IMPRESSION: 1. Technically successful catheter directed bilateral pulmonary artery ultrasound assisted thrombolytic infusion. Electronically Signed   By: Lucrezia Europe M.D.   On: 01/19/2017 12:04     LOS: 7 days   Oren Binet, MD  [facility] Pager:[phone]  If 7PM-7AM, please contact night-coverage [email or web address] Password [email or web address] 01/22/2017, 10:19 AM

## 2017-01-22 NOTE — Progress Notes (Signed)
Progress Note  Patient Name: Jennifer Medina Date of Encounter: 01/22/2017  Primary Cardiologist: Dr. Radford Pax  Subjective   She denies chest pain and palpitations. She reports continued shortness of breath; weaning supplemental oxygen.  Inpatient Medications    Scheduled Meds: . amiodarone  400 mg Oral BID  . atorvastatin  40 mg Oral QHS  . furosemide  40 mg Oral Daily  . latanoprost  1 drop Both Eyes QHS  . mouth rinse  15 mL Mouth Rinse BID  . pantoprazole  40 mg Oral Daily  . potassium chloride  20 mEq Oral Daily  . rivaroxaban  15 mg Oral BID WC   Followed by  . [START ON 02/11/2017] rivaroxaban  20 mg Oral Q supper   Continuous Infusions:  PRN Meds: acetaminophen, LORazepam, [DISCONTINUED] ondansetron **OR** ondansetron (ZOFRAN) IV, traMADol   Vital Signs    Vitals:   01/21/17 0517 01/21/17 1437 01/21/17 2021 01/22/17 0404  BP: (!) 118/54 (!) 129/56 130/62 122/63  Pulse: 63 67 68 67  Resp: 18 18 18 18   Temp: 97.9 F (36.6 C) 98.2 F (36.8 C) 98.1 F (36.7 C) 98.3 F (36.8 C)  TempSrc: Oral Oral Oral Oral  SpO2: 98% 97% 96% 95%  Weight:      Height:       No intake or output data in the 24 hours ending 01/22/17 0842 Filed Weights   01/18/17 0447 01/18/17 1815 01/20/17 1514  Weight: 243 lb 9.6 oz (110.5 kg) 249 lb 12.5 oz (113.3 kg) 260 lb 12.9 oz (118.3 kg)     Physical Exam   General: Well developed, well nourished, female appearing in no acute distress. Head: Normocephalic, atraumatic.  Neck: Supple without bruits, JVD Lungs:  Resp regular and unlabored, CTA. Heart: RRR, S1, S2, no murmur; no rub. Abdomen: Soft, non-tender, non-distended with normoactive bowel sounds. No hepatomegaly. No rebound/guarding. No obvious abdominal masses. Extremities: No clubbing, cyanosis, 1+ edema. Distal pedal pulses are 1+ bilaterally. Extensive bruising on both arms secondary to [facility] and PIVs Neuro: Alert and oriented X 3. Moves all extremities  spontaneously. Psych: Normal affect.  Labs    Chemistry Recent Labs Lab 01/15/17 2338  01/20/17 0431 01/21/17 0229 01/22/17 0138  NA 138  < > 132* 136 135  K 4.1  < > 3.9 4.1 3.8  CL 102  < > 103 105 102  CO2 19*  < > 23 25 29   GLUCOSE 127*  < > 140* 163* 197*  BUN 29*  < > 27* 21* 18  CREATININE 1.40*  < > 1.06* 0.91 1.13*  CALCIUM 8.3*  < > 8.2* 8.5* 8.5*  PROT 5.7*  --   --  4.8* 5.1*  ALBUMIN 2.7*  --   --  2.1* 2.1*  AST 94*  --   --  61* 33  ALT 58*  --   --  228* 160*  ALKPHOS 138*  --   --  158* 150*  BILITOT 1.0  --   --  0.9 0.6  GFRNONAA 34*  < > 48* 58* 45*  GFRAA 40*  < > 56* >60 52*  ANIONGAP 17*  < > 6 6 4*  < > = values in this interval not displayed.   Hematology Recent Labs Lab 01/20/17 0431 01/21/17 0229 01/22/17 0138  WBC 6.1 7.0 6.9  RBC 3.63* 3.70* 3.72*  HGB 10.7* 10.9* 11.0*  HCT 33.6* 34.4* 34.8*  MCV 92.6 93.0 93.5  MCH 29.5 29.5 29.6  MCHC 31.8 31.7 31.6  RDW 15.8* 15.9* 16.0*  PLT 72* 78* 80*    Cardiac Enzymes Recent Labs Lab 01/15/17 2338 01/16/17 0545 01/16/17 1224  TROPONINI 0.38* 0.30* 0.23*   No results for input(s): TROPIPOC in the last 168 hours.   BNP Recent Labs Lab 01/15/17 2338  BNP 841.8*     DDimer No results for input(s): DDIMER in the last 168 hours.   Radiology    No results found.   Telemetry    NSR in the 60-70s - Personally Reviewed  ECG    No new tracings - Personally Reviewed   Cardiac Studies   01/16/2017 LE Doppler: Findings consistent with acute deep vein thrombosis involving theright lower extremity. No evidence of deep vein thrombosis involving the visualizedveins of the left lower extremity.  01/16/2017 ECHO: - Left ventricle: The cavity size was normal. Systolic function wasnormal. The estimated ejection fraction was in the range of 55%to 60%. Wall motion was normal; there were no regional wallmotion abnormalities. Doppler parameters are consistent withabnormal left  ventricular relaxation (grade 1 diastolicdysfunction). - Ventricular septum: Septal motion showed paradox. These changesare consistent with RV-LV interaction. - Left atrium: The atrium was mildly dilated. - Right ventricle: The cavity size was dilated. Wall thickness wasnormal. Systolic function was severely reduced. - Right atrium: The atrium was moderately dilated. - Tricuspid valve: There was moderate-severe regurgitation. - Pulmonary arteries: Systolic pressure was moderately increased.PA peak pressure: 64 mm Hg (S).  Patient Profile     82 y.o. female with massive saddle PE complicated by RV failure, VT and paroxysmal AFib, improving after EKOS thrombolysis.  Assessment & Plan    1. Acute saddle pulmonary embolism complicated by profound hypotension and syncope, resolved. LE venous duplex was positive for DVT in the right posteriotibial trunk and posterior tibial veins.  - she was started on treatment dose xarelto yesterday  2. Acute RV failure with moderate to severe TR and moderate pulmonary HTN. Hypotension has resolved.  Note that sPAP of >60 mm Hg cannot be entirely attributed to the acute event. It suggests preexisting problem such as OSA or previous ignored CTEP disease or just her diastolic HF.  3. New onset atrial fibrillation with RVRand associated wide complex tachycardia worrisome for NSVT, in the setting of acute PE.  - On telemetry, she is having intermittent PVCs, but fewer and fewer. Currently on amiodarone 400 mg BID, but will stop this at the time of discharge.   4. Chronic diastolic CHF- Continue to monitor for volume overload as she has been receiving large fluid boluses intermittently to maintain BP. Physical exam is very challenging due to morbid obesity.   - daily lasix started yesterday - pt needs daily weights  5. Elevated troponin is a sign of PE severity. She does have a family history of CAD in her Dad at 69 and her brother had a  defibrillator. May consider outpatient workup for CAD after she recovers from PE (in several weeks) - she denies chest pain  6. Obesity: high risk for PE recurrence, consider lifelong anticoagulation, notwithstanding remote history of diverticular bleeding.   Signed, Ledora Bottcher , PA-C 8:42 AM 01/22/2017 Pager: [phone]  I have seen and examined the patient along with Ledora Bottcher , PA-C.  I have reviewed the chart, notes and new data.  I agree with PA's note.  Key new complaints: pleurisy and hemoptysis have resolved, dyspnea is still a problem Key examination changes: clear lungs, no arrhythmia recurrence. Weight is  further increased, now 12 lb over admission.  PLAN: Stop amiodarone. Continue weight monitoring. Might need to increase diuretics , but for now her BP is still too tenuous. Anticipate gradual RV function and cardiac output improvement and subsequent diuresis over next 2-3 weeks. Recheck echo for RV function, TR severity and PAP in a month. Anticipate very slow recovery due to baseline mediocre functional status and morbid obesity. Going to SNF.  Sanda Klein, MD, [place] [phone] 01/22/2017, 12:33 PM

## 2017-01-22 NOTE — Clinical Social Work Note (Signed)
Clinical Social Work Assessment  Patient Details  Name: Jennifer Medina MRN: [id number] Date of Birth: [birth year]  Date of referral:  01/22/17               Reason for consult:  Abuse/Neglect, Discharge Planning                Permission sought to share information with:    Permission granted to share information::  Yes, Verbal Permission Granted  Name::     Kendell Bane  Agency::     Relationship::  sister  Contact Information:  [phone]  Housing/Transportation Living arrangements for the past 2 months:  Single Family Home Source of Information:  Patient Patient Interpreter Needed:  None Criminal Activity/Legal Involvement Pertinent to Current Situation/Hospitalization:  No - Comment as needed Significant Relationships:  Siblings, Other Family Members Lives with:  Self Do you feel safe going back to the place where you live?  Yes Need for family participation in patient care:  Yes (Comment)  Care giving concerns: Patient lives at home and needs assistance with most ADL  Social Worker assessment / plan:  Clinical Social Worker met patient at bedside to offer support and discuss discharge needs. Patent stated she had recently been released from [facility] and had been staying on her own since her release. Patient is aware that she needs around the clock support and supervision. patient stated she can live with her sister Blanch Media after discharge. Patient gave CSW verbal permission to contact sister to ask Blanch Media if she would be able to handel assisting patient with her care. Patient stated if sister is unable to help she can go to SNF but does not really want to. CSW faxed out referral to SNF in the [place] area as a backup. CSW to follow up with patient once bed offers are available  Employment status:  Retired Forensic scientist:  Medicare PT Recommendations:  [place] / Referral to community resources:  [place]  Patient/Family's Response to care:  Patient verbalized appreciation and understanding for CSW role and involvement in care. Patient agreeable with current discharge plan to SNF.   Patient/Family's Understanding of and Emotional Response to Diagnosis, Current Treatment, and Prognosis:  Patient has understanding of current medical state and limitations around most recent hospitalization. Patient agreeable to SNF as last resort.   Emotional Assessment Appearance:  Appears stated age Attitude/Demeanor/Rapport:  Other Affect (typically observed):    Orientation:  Oriented to Situation, Oriented to Place, Oriented to Self, Oriented to  Time Alcohol / Substance use:  Not Applicable Psych involvement (Current and /or in the community):  No (Comment)  Discharge Needs  Concerns to be addressed:  No discharge needs identified Readmission within the last 30 days:  No Current discharge risk:  Lives alone Barriers to Discharge:  No Barriers Identified   Wende Neighbors, LCSW 01/22/2017, 3:19 PM

## 2017-01-23 DIAGNOSIS — I509 Heart failure, unspecified: Secondary | ICD-10-CM | POA: Diagnosis not present

## 2017-01-23 DIAGNOSIS — E119 Type 2 diabetes mellitus without complications: Secondary | ICD-10-CM | POA: Diagnosis not present

## 2017-01-23 DIAGNOSIS — Z79899 Other long term (current) drug therapy: Secondary | ICD-10-CM | POA: Diagnosis not present

## 2017-01-23 DIAGNOSIS — R740 Nonspecific elevation of levels of transaminase and lactic acid dehydrogenase [LDH]: Secondary | ICD-10-CM | POA: Diagnosis not present

## 2017-01-23 DIAGNOSIS — I2601 Septic pulmonary embolism with acute cor pulmonale: Secondary | ICD-10-CM | POA: Diagnosis not present

## 2017-01-23 DIAGNOSIS — I2699 Other pulmonary embolism without acute cor pulmonale: Secondary | ICD-10-CM | POA: Diagnosis not present

## 2017-01-23 DIAGNOSIS — Z7901 Long term (current) use of anticoagulants: Secondary | ICD-10-CM | POA: Diagnosis not present

## 2017-01-23 DIAGNOSIS — M79672 Pain in left foot: Secondary | ICD-10-CM | POA: Diagnosis not present

## 2017-01-23 DIAGNOSIS — I82401 Acute embolism and thrombosis of unspecified deep veins of right lower extremity: Secondary | ICD-10-CM | POA: Diagnosis not present

## 2017-01-23 DIAGNOSIS — I2690 Septic pulmonary embolism without acute cor pulmonale: Secondary | ICD-10-CM | POA: Diagnosis not present

## 2017-01-23 DIAGNOSIS — R042 Hemoptysis: Secondary | ICD-10-CM | POA: Diagnosis not present

## 2017-01-23 DIAGNOSIS — D518 Other vitamin B12 deficiency anemias: Secondary | ICD-10-CM | POA: Diagnosis not present

## 2017-01-23 DIAGNOSIS — E785 Hyperlipidemia, unspecified: Secondary | ICD-10-CM | POA: Diagnosis not present

## 2017-01-23 DIAGNOSIS — D696 Thrombocytopenia, unspecified: Secondary | ICD-10-CM | POA: Diagnosis not present

## 2017-01-23 DIAGNOSIS — I4891 Unspecified atrial fibrillation: Secondary | ICD-10-CM | POA: Diagnosis not present

## 2017-01-23 DIAGNOSIS — N179 Acute kidney failure, unspecified: Secondary | ICD-10-CM | POA: Diagnosis not present

## 2017-01-23 DIAGNOSIS — I5032 Chronic diastolic (congestive) heart failure: Secondary | ICD-10-CM | POA: Diagnosis not present

## 2017-01-23 DIAGNOSIS — I2602 Saddle embolus of pulmonary artery with acute cor pulmonale: Secondary | ICD-10-CM | POA: Diagnosis not present

## 2017-01-23 DIAGNOSIS — R000 Tachycardia, unspecified: Secondary | ICD-10-CM | POA: Diagnosis not present

## 2017-01-23 DIAGNOSIS — R748 Abnormal levels of other serum enzymes: Secondary | ICD-10-CM | POA: Diagnosis not present

## 2017-01-23 DIAGNOSIS — I4901 Ventricular fibrillation: Secondary | ICD-10-CM | POA: Diagnosis not present

## 2017-01-23 DIAGNOSIS — E782 Mixed hyperlipidemia: Secondary | ICD-10-CM | POA: Diagnosis not present

## 2017-01-23 LAB — CBC WITH DIFFERENTIAL/PLATELET
BASOS ABS: 0 10*3/uL (ref 0.0–0.1)
BASOS PCT: 0 %
Eosinophils Absolute: 0.1 10*3/uL (ref 0.0–0.7)
Eosinophils Relative: 1 %
HCT: 35.1 % — ABNORMAL LOW (ref 36.0–46.0)
HEMOGLOBIN: 11.5 g/dL — AB (ref 12.0–15.0)
LYMPHS PCT: 14 %
Lymphs Abs: 1.1 10*3/uL (ref 0.7–4.0)
MCH: 30.7 pg (ref 26.0–34.0)
MCHC: 32.8 g/dL (ref 30.0–36.0)
MCV: 93.6 fL (ref 78.0–100.0)
MONO ABS: 0.8 10*3/uL (ref 0.1–1.0)
Monocytes Relative: 10 %
NEUTROS ABS: 5.6 10*3/uL (ref 1.7–7.7)
NEUTROS PCT: 75 %
Platelets: 101 10*3/uL — ABNORMAL LOW (ref 150–400)
RBC: 3.75 MIL/uL — ABNORMAL LOW (ref 3.87–5.11)
RDW: 16.2 % — ABNORMAL HIGH (ref 11.5–15.5)
WBC: 7.5 10*3/uL (ref 4.0–10.5)

## 2017-01-23 MED ORDER — TRAMADOL HCL 50 MG PO TABS
50.0000 mg | ORAL_TABLET | Freq: Four times a day (QID) | ORAL | 0 refills | Status: AC | PRN
Start: 2017-01-23 — End: ?

## 2017-01-23 MED ORDER — RIVAROXABAN 20 MG PO TABS: 20.0000 mg | ORAL_TABLET | Freq: Every day | ORAL | Status: DC

## 2017-01-23 MED ORDER — SALINE SPRAY 0.65 % NA SOLN: 1.0000 | NASAL | 0 refills | Status: AC | PRN

## 2017-01-23 MED ORDER — LORAZEPAM 0.5 MG PO TABS: 0.5000 mg | ORAL_TABLET | Freq: Three times a day (TID) | ORAL | 0 refills | Status: AC | PRN

## 2017-01-23 MED ORDER — OXYMETAZOLINE HCL 0.05 % NA SOLN
1.0000 | Freq: Two times a day (BID) | NASAL | Status: DC
  Administered 2017-01-23: 1 via NASAL
  Filled 2017-01-23: qty 15

## 2017-01-23 MED ORDER — OXYMETAZOLINE HCL 0.05 % NA SOLN: 1.0000 | Freq: Two times a day (BID) | NASAL | 0 refills | Status: AC

## 2017-01-23 MED ORDER — SALINE SPRAY 0.65 % NA SOLN
1.0000 | NASAL | Status: DC | PRN
  Filled 2017-01-23: qty 44

## 2017-01-23 MED ORDER — RIVAROXABAN 15 MG PO TABS: 15.0000 mg | ORAL_TABLET | Freq: Two times a day (BID) | ORAL | Status: DC

## 2017-01-23 NOTE — Progress Notes (Signed)
Report given to McKenzie.

## 2017-01-23 NOTE — Progress Notes (Signed)
Seen and examined, feels much better today. No further epistaxis this morning. Stable for discharge to SNF Please see discharge summary for further details.

## 2017-01-23 NOTE — Progress Notes (Signed)
Clinical Social Worker facilitated patient discharge including contacting patient family and facility to confirm patient discharge plans.  Clinical information faxed to facility and family agreeable with plan.  CSW arranged ambulance transport via [facility] to [facility] .  RN to call [phone] and ask for the RN that has room 201-2 for report prior to discharge.  Clinical Social Worker will sign off for now as social work intervention is no longer needed. Please consult [place] again if new need arises.  Rhea Pink, MSW, [place]

## 2017-01-23 NOTE — Progress Notes (Signed)
Patient has had 2 small nose bleeds one last night and one this a.m. That started after turning patient in bed. Pressure was applied by patient and small ice pack to nose applied.

## 2017-01-23 NOTE — Discharge Summary (Addendum)
PATIENT DETAILS Name: Jennifer Medina Age: 82 y.o. Sex: female Date of Birth: [birth year] MRN: [id number]. Admitting Physician: Rigoberto Noel, MD PCP:No primary care provider on file.  Admit Date: 01/15/2017 Discharge date: 01/23/2017  Recommendations for Outpatient Follow-up:  1. Follow up with PCP in 1-2 weeks 2. Please obtain CMP/CBC in one week 3. Please follow up with pulmonology and cardiology.  4. Will require further hypercoagulable workup and age-appropriate cancer screening workup as an outpatient. Please refer patient to hematology/oncology in the outpatient setting prior to discontinuation of anticoagulation.   Admitted From:  Home  Disposition: SNF   Home Health: No  Equipment/Devices: None  Discharge Condition: Stable  CODE STATUS: FULL CODE  Diet recommendation:  Heart Healthy  Brief Summary: See H&P, Labs, Consult and Test reports for all details in brief, Patient is a 82 y.o. female chronic diastolic heart failure who was admitted on 3/7 for a submassive pulmonary embolism, she was initially started on IV heparin, however she subsequently became hemodynamically unstable, required catheter guided thrombolytic therapy. She was managed by [facility] and transferred to the [facility] hospitalist service on 3/13, she remains on IV heparin. See below for further details  Brief Hospital Course: Acute submassive pulmonary embolism: Initially tolerated on IV heparin, but became hemodynamically unstable requiring catheter-related thrombolytic therapy. She was subsequently maintained on IV heparin, she has now been switched to Xarelto. Will require further hypercoagulable workup and age-appropriate cancer screening workup as an outpatient.  Right lower extremity DVT: On anticoagulation-see above.  A. fib with RVR: Back in sinus rhythm, followed closely by following, required amiodarone for rate control. Spoke with cardiology, she does not require amiodarone on  discharge-on anticoagulation as above.CHA2DSVASSC score of aleast 3. Already on anticoagulation for pulmonary embolism.  Elevated troponins: Likely secondary to PE, cardiology planning on outpatient workup after patient recovers from PE.  Hemoptysis: Very minimal, probably will have some ongoing hemoptysis given large clot burden/lung infarction. Continue anticoagulation. Continue to monitor closely at SNF.  Chronic diastolic CHF: Difficult exam to assess for volume overload-but home weight is up in her usual baseline-restarted on oral diuretics during this hospital stay, weight is down to 238 pounds. Do not think patient had acute exacerbation of underlying diastolic heart failure.  Thrombocytopenia: Suspect platelet consumption due to massive clot burden, platelet count slowly improving- continue to follow.  Acute kidney injury: Resolved, likely hemodynamically mediated kidney injury.  Elevated AST/ALT: Could be remnant of shock liver-plans are to follow. Benign abdominal exam. If LFTs do not improve, then will plan on further workup-slowly down trending-continue to trend LFTs in the outpatient setting  Procedures/Studies: Echo 3/13>> - Left ventricle: The cavity size was normal. Systolic function was normal. The estimated ejection fraction was in the range of 55% to 60%. Wall motion was normal; there were no regional wall motion abnormalities. Doppler parameters are consistent with abnormal left ventricular relaxation (grade 1 diastolic dysfunction). - Ventricular septum: Septal motion showed paradox. These changes are consistent with RV-LV interaction. - Left atrium: The atrium was mildly dilated. - Right ventricle: The cavity size was dilated. Wall thickness was normal. Systolic function was severely reduced. - Right atrium: The atrium was moderately dilated. - Tricuspid valve: There was moderate-severe regurgitation. - Pulmonary arteries: Systolic pressure was  moderately increased. PA peak pressure: 64 mm Hg (S).  3/10>>EKOS  Discharge Diagnoses:  Principal Problem:   Acute pulmonary embolism ([place]) Active Problems:   CHF (congestive heart failure) (HCC)   Hyperglycemia   Elevated  troponin   Atrial fibrillation with RVR (HCC)   Wide-complex tachycardia (HCC)   Severe right ventricular systolic dysfunction (HCC)   Saddle embolus of pulmonary artery with acute cor pulmonale (HCC)   AKI (acute kidney injury) (HCC)   Thrombocytopenia (HCC)   Transaminitis   Hemoptysis   SOB (shortness of breath)   Acute massive pulmonary embolism [facility])   Discharge Instructions:  Activity:  As tolerated with Full fall precautions use walker/cane & assistance as needed   Discharge Instructions    (HEART FAILURE PATIENTS) Call MD:  Anytime you have any of the following symptoms: 1) 3 pound weight gain in 24 hours or 5 pounds in 1 week 2) shortness of breath, with or without a dry hacking cough 3) swelling in the hands, feet or stomach 4) if you have to sleep on extra pillows at night in order to breathe.    Complete by:  As directed    Call MD for:  difficulty breathing, headache or visual disturbances    Complete by:  As directed    Diet - low sodium heart healthy    Complete by:  As directed    Increase activity slowly    Complete by:  As directed      Allergies as of 01/23/2017      Reactions   Aspirin Other (See Comments)   GI bleed   Nsaids Other (See Comments)   GI bleed   Penicillins Shortness Of Breath, Rash   Simvastatin Nausea Only   Vancomycin Rash      Medication List    TAKE these medications   acetaminophen 325 MG tablet Commonly known as:  TYLENOL Take 650 mg by mouth every 4 (four) hours as needed for moderate pain or headache.   atorvastatin 40 MG tablet Commonly known as:  LIPITOR Take 40 mg by mouth at bedtime.   furosemide 40 MG tablet Commonly known as:  LASIX Take 40 mg by mouth daily.   JOINTFLEX EX Apply  1 application topically as needed (for pain).   latanoprost 0.005 % ophthalmic solution Commonly known as:  XALATAN Place 1 drop into both eyes at bedtime.   loperamide 2 MG tablet Commonly known as:  IMODIUM A-D Take 2 mg by mouth 4 (four) times daily as needed for diarrhea or loose stools.   LORazepam 0.5 MG tablet Commonly known as:  ATIVAN Take 1 tablet (0.5 mg total) by mouth every 8 (eight) hours as needed for anxiety.   metoprolol succinate 25 MG 24 hr tablet Commonly known as:  TOPROL-XL Take 25 mg by mouth daily.   omeprazole 40 MG capsule Commonly known as:  PRILOSEC Take 40 mg by mouth daily.   oxymetazoline 0.05 % nasal spray Commonly known as:  AFRIN Place 1 spray into both nostrils 2 (two) times daily. For 5 doses and then stop   potassium chloride SA 20 MEQ tablet Commonly known as:  K-DUR,KLOR-CON Take 20 mEq by mouth daily.   Rivaroxaban 15 MG Tabs tablet Commonly known as:  XARELTO Take 1 tablet (15 mg total) by mouth 2 (two) times daily with a meal. 21 days from 3/13   rivaroxaban 20 MG Tabs tablet Commonly known as:  XARELTO Take 1 tablet (20 mg total) by mouth daily with supper. 20 mg, Oral, Daily with supper, First dose on Tue 02/11/17 at 1700 Start taking on:  02/11/2017   sodium chloride 0.65 % Soln nasal spray Commonly known as:  OCEAN Place 1 spray into both  nostrils as needed for congestion. Please use 10 times a day   traMADol 50 MG tablet Commonly known as:  ULTRAM Take 1 tablet (50 mg total) by mouth every 6 (six) hours as needed for moderate pain.      Follow-up Information    Rexene Edison, NP Follow up on 02/04/2017.   Specialty:  Pulmonary Disease Why:  Appt at 10:30 AM Contact information: [address] [place] [address] [phone]        Luke Kilroy, PA-C Follow up on 02/05/2017.   Specialties:  Cardiology, Radiology Why:  8:00 AM for hospital follow up and [facility] appt Contact information: [address] [place] [address] [phone]        Primary MD. Schedule an appointment as soon as possible for a visit in 1 week(s).          Allergies  Allergen Reactions  . Aspirin Other (See Comments)    GI bleed  . Nsaids Other (See Comments)    GI bleed  . Penicillins Shortness Of Breath and Rash  . Simvastatin Nausea Only  . Vancomycin Rash    Consultations:   cardiology, pulmonary/intensive care and IR  Other Procedures/Studies: Ir Angiogram Pulmonary Bilateral Selective  Result Date: 01/19/2017 INDICATION: Acute bilateral pulmonary emboli. Recent new cardiac arrhythmia and progressive right heart strain. EXAM: 1. ULTRASOUND GUIDANCE FOR VENOUS ACCESS X2 2. DIRECT PULMONARY ARTERY PRESSURE MEASUREMENTS 3. FLUOROSCOPIC GUIDED PLACEMENT OF BILATERAL PULMONARY ARTERIAL LYTIC INFUSION CATHETERS COMPARISON:  Chest CTA - 01/15/2017 MEDICATIONS: Intravenous Fentanyl and Versed were administered as conscious sedation during continuous monitoring of the patient's level of consciousness and physiological / cardiorespiratory status by the radiology RN, with a total moderate sedation time of 50minutes. CONTRAST:  None used FLUOROSCOPY TIME:  6.9 min  (82  uGym2 DAP) COMPLICATIONS: None immediate TECHNIQUE: Informed written consent was obtained from the patient after a discussion of the risks, benefits and alternatives to treatment. Questions regarding the procedure were encouraged and answered. A timeout was performed prior to the initiation of the procedure. Previous ultrasound had demonstrated right lower extremity DVT. Ultrasound scanning was performed of the right IJ vein demonstrating widely patent. As such, the right internal jugular vein was selected for vascular access. The right neck was prepped and draped in the usual sterile fashion, and a sterile drape was applied covering the operative field. Maximum barrier sterile technique with sterile gowns and gloves were used for the procedure. A  timeout was performed prior to the initiation of the procedure. Local anesthesia was provided with 1% lidocaine. Under direct ultrasound guidance, the right internal jugular vein was accessed with a micro puncture sheath ultimately allowing placement of a 6 French vascular sheath. With the use of a glidewire, an angled pigtail catheter was advanced into the right main pulmonary artery and Pressure measurements were then obtained from the right pulmonary artery. Over a wire, the pigtail catheter was exchanged for a 105/18 cm multi side-hole EKOS ultrasound assisted infusion catheter. Slightly cranial to this initial access, the right internal jugular vein was again accessed with a micropuncture sheath ultimately allowing placement of a 6 French vascular sheath. With the use of a glidewire, a pigtail catheter was advanced into the left main pulmonary artery . Over an exchange length wire, the pigtail catheter was exchanged for a 105/12 cm multi side-hole EKOS ultrasound assisted infusion catheter. A postprocedural fluoroscopic image was obtained of the check demonstrating final catheter positioning. Both vascular sheath were secured at  the right neck with interrupted 0 Prolene suture. The external catheter tubing was secured at the right chest and the lytic therapy was initiated. The patient tolerated the procedure well without immediate postprocedural complication. FINDINGS: Directly recorded proximal right pulmonary artery pressure measurements: 46/10 (23) mmHg (normal: < 25/10) Following the procedure, both ultrasound assisted infusion catheter tips terminate within the distal aspects of the bilateral lower lobe sub segmental pulmonary arteries. IMPRESSION: 1. Successful fluoroscopic guided initiation of bilateral ultrasound assisted catheter directed pulmonary arterial lysis for sub massive pulmonary embolism and right-sided heart strain. 2. Markedly elevated pressure measurements within the right main pulmonary  artery compatible with critical pulmonary arterial hypertension. PLAN: - The patient will be assessed approximately 12 hours following the initiation of the catheter directed pulmonary arterial lysis for repeat pressure measurements and either removal of the catheters or continuation of the catheter directed thrombolysis. Electronically Signed   By: Lucrezia Europe M.D.   On: 01/19/2017 12:17   Ir Angiogram Selective Each Additional Vessel  Result Date: 01/19/2017 INDICATION: Acute bilateral pulmonary emboli. Recent new cardiac arrhythmia and progressive right heart strain. EXAM: 1. ULTRASOUND GUIDANCE FOR VENOUS ACCESS X2 2. DIRECT PULMONARY ARTERY PRESSURE MEASUREMENTS 3. FLUOROSCOPIC GUIDED PLACEMENT OF BILATERAL PULMONARY ARTERIAL LYTIC INFUSION CATHETERS COMPARISON:  Chest CTA - 01/15/2017 MEDICATIONS: Intravenous Fentanyl and Versed were administered as conscious sedation during continuous monitoring of the patient's level of consciousness and physiological / cardiorespiratory status by the radiology RN, with a total moderate sedation time of 58minutes. CONTRAST:  None used FLUOROSCOPY TIME:  6.9 min  (82  uGym2 DAP) COMPLICATIONS: None immediate TECHNIQUE: Informed written consent was obtained from the patient after a discussion of the risks, benefits and alternatives to treatment. Questions regarding the procedure were encouraged and answered. A timeout was performed prior to the initiation of the procedure. Previous ultrasound had demonstrated right lower extremity DVT. Ultrasound scanning was performed of the right IJ vein demonstrating widely patent. As such, the right internal jugular vein was selected for vascular access. The right neck was prepped and draped in the usual sterile fashion, and a sterile drape was applied covering the operative field. Maximum barrier sterile technique with sterile gowns and gloves were used for the procedure. A timeout was performed prior to the initiation of the  procedure. Local anesthesia was provided with 1% lidocaine. Under direct ultrasound guidance, the right internal jugular vein was accessed with a micro puncture sheath ultimately allowing placement of a 6 French vascular sheath. With the use of a glidewire, an angled pigtail catheter was advanced into the right main pulmonary artery and Pressure measurements were then obtained from the right pulmonary artery. Over a wire, the pigtail catheter was exchanged for a 105/18 cm multi side-hole EKOS ultrasound assisted infusion catheter. Slightly cranial to this initial access, the right internal jugular vein was again accessed with a micropuncture sheath ultimately allowing placement of a 6 French vascular sheath. With the use of a glidewire, a pigtail catheter was advanced into the left main pulmonary artery . Over an exchange length wire, the pigtail catheter was exchanged for a 105/12 cm multi side-hole EKOS ultrasound assisted infusion catheter. A postprocedural fluoroscopic image was obtained of the check demonstrating final catheter positioning. Both vascular sheath were secured at the right neck with interrupted 0 Prolene suture. The external catheter tubing was secured at the right chest and the lytic therapy was initiated. The patient tolerated the procedure well without immediate postprocedural complication. FINDINGS: Directly recorded proximal right pulmonary artery  pressure measurements: 46/10 (23) mmHg (normal: < 25/10) Following the procedure, both ultrasound assisted infusion catheter tips terminate within the distal aspects of the bilateral lower lobe sub segmental pulmonary arteries. IMPRESSION: 1. Successful fluoroscopic guided initiation of bilateral ultrasound assisted catheter directed pulmonary arterial lysis for sub massive pulmonary embolism and right-sided heart strain. 2. Markedly elevated pressure measurements within the right main pulmonary artery compatible with critical pulmonary arterial  hypertension. PLAN: - The patient will be assessed approximately 12 hours following the initiation of the catheter directed pulmonary arterial lysis for repeat pressure measurements and either removal of the catheters or continuation of the catheter directed thrombolysis. Electronically Signed   By: Lucrezia Europe M.D.   On: 01/19/2017 12:17   Ir Angiogram Selective Each Additional Vessel  Result Date: 01/19/2017 INDICATION: Acute bilateral pulmonary emboli. Recent new cardiac arrhythmia and progressive right heart strain. EXAM: 1. ULTRASOUND GUIDANCE FOR VENOUS ACCESS X2 2. DIRECT PULMONARY ARTERY PRESSURE MEASUREMENTS 3. FLUOROSCOPIC GUIDED PLACEMENT OF BILATERAL PULMONARY ARTERIAL LYTIC INFUSION CATHETERS COMPARISON:  Chest CTA - 01/15/2017 MEDICATIONS: Intravenous Fentanyl and Versed were administered as conscious sedation during continuous monitoring of the patient's level of consciousness and physiological / cardiorespiratory status by the radiology RN, with a total moderate sedation time of 74minutes. CONTRAST:  None used FLUOROSCOPY TIME:  6.9 min  (82  uGym2 DAP) COMPLICATIONS: None immediate TECHNIQUE: Informed written consent was obtained from the patient after a discussion of the risks, benefits and alternatives to treatment. Questions regarding the procedure were encouraged and answered. A timeout was performed prior to the initiation of the procedure. Previous ultrasound had demonstrated right lower extremity DVT. Ultrasound scanning was performed of the right IJ vein demonstrating widely patent. As such, the right internal jugular vein was selected for vascular access. The right neck was prepped and draped in the usual sterile fashion, and a sterile drape was applied covering the operative field. Maximum barrier sterile technique with sterile gowns and gloves were used for the procedure. A timeout was performed prior to the initiation of the procedure. Local anesthesia was provided with 1% lidocaine.  Under direct ultrasound guidance, the right internal jugular vein was accessed with a micro puncture sheath ultimately allowing placement of a 6 French vascular sheath. With the use of a glidewire, an angled pigtail catheter was advanced into the right main pulmonary artery and Pressure measurements were then obtained from the right pulmonary artery. Over a wire, the pigtail catheter was exchanged for a 105/18 cm multi side-hole EKOS ultrasound assisted infusion catheter. Slightly cranial to this initial access, the right internal jugular vein was again accessed with a micropuncture sheath ultimately allowing placement of a 6 French vascular sheath. With the use of a glidewire, a pigtail catheter was advanced into the left main pulmonary artery . Over an exchange length wire, the pigtail catheter was exchanged for a 105/12 cm multi side-hole EKOS ultrasound assisted infusion catheter. A postprocedural fluoroscopic image was obtained of the check demonstrating final catheter positioning. Both vascular sheath were secured at the right neck with interrupted 0 Prolene suture. The external catheter tubing was secured at the right chest and the lytic therapy was initiated. The patient tolerated the procedure well without immediate postprocedural complication. FINDINGS: Directly recorded proximal right pulmonary artery pressure measurements: 46/10 (23) mmHg (normal: < 25/10) Following the procedure, both ultrasound assisted infusion catheter tips terminate within the distal aspects of the bilateral lower lobe sub segmental pulmonary arteries. IMPRESSION: 1. Successful fluoroscopic guided initiation of bilateral ultrasound assisted  catheter directed pulmonary arterial lysis for sub massive pulmonary embolism and right-sided heart strain. 2. Markedly elevated pressure measurements within the right main pulmonary artery compatible with critical pulmonary arterial hypertension. PLAN: - The patient will be assessed  approximately 12 hours following the initiation of the catheter directed pulmonary arterial lysis for repeat pressure measurements and either removal of the catheters or continuation of the catheter directed thrombolysis. Electronically Signed   By: Lucrezia Europe M.D.   On: 01/19/2017 12:17   Ir US Guide Vasc Access Right  Result Date: 01/19/2017 INDICATION: Acute bilateral pulmonary emboli. Recent new cardiac arrhythmia and progressive right heart strain. EXAM: 1. ULTRASOUND GUIDANCE FOR VENOUS ACCESS X2 2. DIRECT PULMONARY ARTERY PRESSURE MEASUREMENTS 3. FLUOROSCOPIC GUIDED PLACEMENT OF BILATERAL PULMONARY ARTERIAL LYTIC INFUSION CATHETERS COMPARISON:  Chest CTA - 01/15/2017 MEDICATIONS: Intravenous Fentanyl and Versed were administered as conscious sedation during continuous monitoring of the patient's level of consciousness and physiological / cardiorespiratory status by the radiology RN, with a total moderate sedation time of 5minutes. CONTRAST:  None used FLUOROSCOPY TIME:  6.9 min  (82  uGym2 DAP) COMPLICATIONS: None immediate TECHNIQUE: Informed written consent was obtained from the patient after a discussion of the risks, benefits and alternatives to treatment. Questions regarding the procedure were encouraged and answered. A timeout was performed prior to the initiation of the procedure. Previous ultrasound had demonstrated right lower extremity DVT. Ultrasound scanning was performed of the right IJ vein demonstrating widely patent. As such, the right internal jugular vein was selected for vascular access. The right neck was prepped and draped in the usual sterile fashion, and a sterile drape was applied covering the operative field. Maximum barrier sterile technique with sterile gowns and gloves were used for the procedure. A timeout was performed prior to the initiation of the procedure. Local anesthesia was provided with 1% lidocaine. Under direct ultrasound guidance, the right internal jugular vein  was accessed with a micro puncture sheath ultimately allowing placement of a 6 French vascular sheath. With the use of a glidewire, an angled pigtail catheter was advanced into the right main pulmonary artery and Pressure measurements were then obtained from the right pulmonary artery. Over a wire, the pigtail catheter was exchanged for a 105/18 cm multi side-hole EKOS ultrasound assisted infusion catheter. Slightly cranial to this initial access, the right internal jugular vein was again accessed with a micropuncture sheath ultimately allowing placement of a 6 French vascular sheath. With the use of a glidewire, a pigtail catheter was advanced into the left main pulmonary artery . Over an exchange length wire, the pigtail catheter was exchanged for a 105/12 cm multi side-hole EKOS ultrasound assisted infusion catheter. A postprocedural fluoroscopic image was obtained of the check demonstrating final catheter positioning. Both vascular sheath were secured at the right neck with interrupted 0 Prolene suture. The external catheter tubing was secured at the right chest and the lytic therapy was initiated. The patient tolerated the procedure well without immediate postprocedural complication. FINDINGS: Directly recorded proximal right pulmonary artery pressure measurements: 46/10 (23) mmHg (normal: < 25/10) Following the procedure, both ultrasound assisted infusion catheter tips terminate within the distal aspects of the bilateral lower lobe sub segmental pulmonary arteries. IMPRESSION: 1. Successful fluoroscopic guided initiation of bilateral ultrasound assisted catheter directed pulmonary arterial lysis for sub massive pulmonary embolism and right-sided heart strain. 2. Markedly elevated pressure measurements within the right main pulmonary artery compatible with critical pulmonary arterial hypertension. PLAN: - The patient will be assessed approximately 12 hours  following the initiation of the catheter directed  pulmonary arterial lysis for repeat pressure measurements and either removal of the catheters or continuation of the catheter directed thrombolysis. Electronically Signed   By: Lucrezia Europe M.D.   On: 01/19/2017 12:17   Dg Chest Port 1 View  Result Date: 01/20/2017 CLINICAL DATA:  Respiratory failure. EXAM: PORTABLE CHEST 1 VIEW COMPARISON:  01/19/2017. FINDINGS: Interim removal of pulmonary infusion catheters. Stable cardiomegaly. Low lung volumes with basilar subsegmental atelectasis. Small right pleural effusion cannot be excluded. No pneumothorax. Postsurgical changes right shoulder. IMPRESSION: 1. Interim removal of pulmonary infusion catheters. 2. Stable cardiomegaly. 3. Low lung volumes with mild basilar atelectasis . Small right pleural effusion cannot be excluded. Electronically Signed   By: Marcello Moores  Register   On: 01/20/2017 07:05   Dg Chest Port 1 View  Result Date: 01/19/2017 CLINICAL DATA:  82 year old female with acute saddle embolus. Postoperative day 1 status post catheter directed ultrasound-assisted bilateral pulmonary arterial thrombolysis initiated via R IJ x2. Hemoptysis EXAM: PORTABLE CHEST 1 VIEW COMPARISON:  01/15/2017 portable chest and earlier. FINDINGS: Portable AP semi upright view at 0947 hours. Right IJ approach pulmonary artery infusion catheters are in place. Left chest pacer or resuscitation pads are in place. Stable lung volumes. Stable cardiac size and mediastinal contours. Allowing for portable technique the lungs are clear. IMPRESSION: Right IJ approach pulmonary artery infusion catheters in place, otherwise stable and negative portable radiographic appearance of the chest. Electronically Signed   By: Genevie Ann M.D.   On: 01/19/2017 10:07   Dg Chest Port 1 View  Result Date: 01/15/2017 CLINICAL DATA:  Shortness of breath EXAM: PORTABLE CHEST 1 VIEW COMPARISON:  01/15/2017, FINDINGS: No acute infiltrate or effusion. Stable cardiomediastinal silhouette. No pneumothorax.  IMPRESSION: Stable borderline to mild cardiomegaly.  No edema.  No infiltrate. Electronically Signed   By: Donavan Foil M.D.   On: 01/15/2017 23:38   Ir Infusion Thrombol Arterial Initial (ms)  Result Date: 01/19/2017 INDICATION: Acute bilateral pulmonary emboli. Recent new cardiac arrhythmia and progressive right heart strain. EXAM: 1. ULTRASOUND GUIDANCE FOR VENOUS ACCESS X2 2. DIRECT PULMONARY ARTERY PRESSURE MEASUREMENTS 3. FLUOROSCOPIC GUIDED PLACEMENT OF BILATERAL PULMONARY ARTERIAL LYTIC INFUSION CATHETERS COMPARISON:  Chest CTA - 01/15/2017 MEDICATIONS: Intravenous Fentanyl and Versed were administered as conscious sedation during continuous monitoring of the patient's level of consciousness and physiological / cardiorespiratory status by the radiology RN, with a total moderate sedation time of 22minutes. CONTRAST:  None used FLUOROSCOPY TIME:  6.9 min  (82  uGym2 DAP) COMPLICATIONS: None immediate TECHNIQUE: Informed written consent was obtained from the patient after a discussion of the risks, benefits and alternatives to treatment. Questions regarding the procedure were encouraged and answered. A timeout was performed prior to the initiation of the procedure. Previous ultrasound had demonstrated right lower extremity DVT. Ultrasound scanning was performed of the right IJ vein demonstrating widely patent. As such, the right internal jugular vein was selected for vascular access. The right neck was prepped and draped in the usual sterile fashion, and a sterile drape was applied covering the operative field. Maximum barrier sterile technique with sterile gowns and gloves were used for the procedure. A timeout was performed prior to the initiation of the procedure. Local anesthesia was provided with 1% lidocaine. Under direct ultrasound guidance, the right internal jugular vein was accessed with a micro puncture sheath ultimately allowing placement of a 6 French vascular sheath. With the use of a  glidewire, an angled pigtail catheter was advanced  into the right main pulmonary artery and Pressure measurements were then obtained from the right pulmonary artery. Over a wire, the pigtail catheter was exchanged for a 105/18 cm multi side-hole EKOS ultrasound assisted infusion catheter. Slightly cranial to this initial access, the right internal jugular vein was again accessed with a micropuncture sheath ultimately allowing placement of a 6 French vascular sheath. With the use of a glidewire, a pigtail catheter was advanced into the left main pulmonary artery . Over an exchange length wire, the pigtail catheter was exchanged for a 105/12 cm multi side-hole EKOS ultrasound assisted infusion catheter. A postprocedural fluoroscopic image was obtained of the check demonstrating final catheter positioning. Both vascular sheath were secured at the right neck with interrupted 0 Prolene suture. The external catheter tubing was secured at the right chest and the lytic therapy was initiated. The patient tolerated the procedure well without immediate postprocedural complication. FINDINGS: Directly recorded proximal right pulmonary artery pressure measurements: 46/10 (23) mmHg (normal: < 25/10) Following the procedure, both ultrasound assisted infusion catheter tips terminate within the distal aspects of the bilateral lower lobe sub segmental pulmonary arteries. IMPRESSION: 1. Successful fluoroscopic guided initiation of bilateral ultrasound assisted catheter directed pulmonary arterial lysis for sub massive pulmonary embolism and right-sided heart strain. 2. Markedly elevated pressure measurements within the right main pulmonary artery compatible with critical pulmonary arterial hypertension. PLAN: - The patient will be assessed approximately 12 hours following the initiation of the catheter directed pulmonary arterial lysis for repeat pressure measurements and either removal of the catheters or continuation of the catheter  directed thrombolysis. Electronically Signed   By: Lucrezia Europe M.D.   On: 01/19/2017 12:17   Ir Infusion Thrombol Arterial Initial (ms)  Result Date: 01/19/2017 INDICATION: Acute bilateral pulmonary emboli. Recent new cardiac arrhythmia and progressive right heart strain. EXAM: 1. ULTRASOUND GUIDANCE FOR VENOUS ACCESS X2 2. DIRECT PULMONARY ARTERY PRESSURE MEASUREMENTS 3. FLUOROSCOPIC GUIDED PLACEMENT OF BILATERAL PULMONARY ARTERIAL LYTIC INFUSION CATHETERS COMPARISON:  Chest CTA - 01/15/2017 MEDICATIONS: Intravenous Fentanyl and Versed were administered as conscious sedation during continuous monitoring of the patient's level of consciousness and physiological / cardiorespiratory status by the radiology RN, with a total moderate sedation time of 5minutes. CONTRAST:  None used FLUOROSCOPY TIME:  6.9 min  (82  uGym2 DAP) COMPLICATIONS: None immediate TECHNIQUE: Informed written consent was obtained from the patient after a discussion of the risks, benefits and alternatives to treatment. Questions regarding the procedure were encouraged and answered. A timeout was performed prior to the initiation of the procedure. Previous ultrasound had demonstrated right lower extremity DVT. Ultrasound scanning was performed of the right IJ vein demonstrating widely patent. As such, the right internal jugular vein was selected for vascular access. The right neck was prepped and draped in the usual sterile fashion, and a sterile drape was applied covering the operative field. Maximum barrier sterile technique with sterile gowns and gloves were used for the procedure. A timeout was performed prior to the initiation of the procedure. Local anesthesia was provided with 1% lidocaine. Under direct ultrasound guidance, the right internal jugular vein was accessed with a micro puncture sheath ultimately allowing placement of a 6 French vascular sheath. With the use of a glidewire, an angled pigtail catheter was advanced into the right  main pulmonary artery and Pressure measurements were then obtained from the right pulmonary artery. Over a wire, the pigtail catheter was exchanged for a 105/18 cm multi side-hole EKOS ultrasound assisted infusion catheter. Slightly cranial to this initial  access, the right internal jugular vein was again accessed with a micropuncture sheath ultimately allowing placement of a 6 French vascular sheath. With the use of a glidewire, a pigtail catheter was advanced into the left main pulmonary artery . Over an exchange length wire, the pigtail catheter was exchanged for a 105/12 cm multi side-hole EKOS ultrasound assisted infusion catheter. A postprocedural fluoroscopic image was obtained of the check demonstrating final catheter positioning. Both vascular sheath were secured at the right neck with interrupted 0 Prolene suture. The external catheter tubing was secured at the right chest and the lytic therapy was initiated. The patient tolerated the procedure well without immediate postprocedural complication. FINDINGS: Directly recorded proximal right pulmonary artery pressure measurements: 46/10 (23) mmHg (normal: < 25/10) Following the procedure, both ultrasound assisted infusion catheter tips terminate within the distal aspects of the bilateral lower lobe sub segmental pulmonary arteries. IMPRESSION: 1. Successful fluoroscopic guided initiation of bilateral ultrasound assisted catheter directed pulmonary arterial lysis for sub massive pulmonary embolism and right-sided heart strain. 2. Markedly elevated pressure measurements within the right main pulmonary artery compatible with critical pulmonary arterial hypertension. PLAN: - The patient will be assessed approximately 12 hours following the initiation of the catheter directed pulmonary arterial lysis for repeat pressure measurements and either removal of the catheters or continuation of the catheter directed thrombolysis. Electronically Signed   By: Lucrezia Europe M.D.    On: 01/19/2017 12:17   Ir Jacolyn Reedy F/u Eval Art/ven Final Day (ms)  Result Date: 01/19/2017 CLINICAL DATA:  Sub massive pulmonary emboli, status post 12+ hour ultrasound assisted catheter directed bilateral pulmonary arterial thrombolytic infusion, complicated only by Mild sporadic hemoptysis. EXAM: IR THROMB F/U EVAL ART/VEN FINAL DAY ANESTHESIA/SEDATION: None used MEDICATIONS: None CONTRAST:  None PROCEDURE: Stable catheter position confirmed on preprocedure chest radiography. Repeat pulmonary arterial pressure measurements obtained bedside . The catheters and sheaths were removed and hemostasis achieved with manual compression. The patient tolerated the procedure well. COMPLICATIONS: None immediate FINDINGS: Postprocedure pulmonary artery pressure 41/15 (25) mmHg. IMPRESSION: 1. Technically successful catheter directed bilateral pulmonary artery ultrasound assisted thrombolytic infusion. Electronically Signed   By: Lucrezia Europe M.D.   On: 01/19/2017 12:04     TODAY-DAY OF DISCHARGE:  Subjective:   Jennifer Medina today has no headache,no chest abdominal pain,no new weakness tingling or numbness, feels much better wants to go home today.   Objective:   Blood pressure (!) 134/56, pulse 78, temperature 98 F (36.7 C), temperature source Oral, resp. rate (!) 21, height 5\' 5"  (1.651 m), weight 108.4 kg (238 lb 15.7 oz), SpO2 96 %.  Intake/Output Summary (Last 24 hours) at 01/23/17 0931 Last data filed at 01/23/17 0612  Gross per 24 hour  Intake              840 ml  Output              100 ml  Net              740 ml   Filed Weights   01/18/17 1815 01/20/17 1514 01/23/17 0300  Weight: 113.3 kg (249 lb 12.5 oz) 118.3 kg (260 lb 12.9 oz) 108.4 kg (238 lb 15.7 oz)    Exam: Awake Alert, Oriented *3, No new F.N deficits, Normal affect [place].AT,PERRAL Supple Neck,No JVD, No cervical lymphadenopathy appriciated.  Symmetrical Chest wall movement, Good air movement bilaterally RRR,No Gallops,Rubs  or new Murmurs, No Parasternal Heave +ve B.Sounds, Abd Soft, Non tender, No organomegaly appriciated No Cyanosis, Clubbing  or edema, No new Rash or bruise   PERTINENT RADIOLOGIC STUDIES: Ir Angiogram Pulmonary Bilateral Selective  Result Date: 01/19/2017 INDICATION: Acute bilateral pulmonary emboli. Recent new cardiac arrhythmia and progressive right heart strain. EXAM: 1. ULTRASOUND GUIDANCE FOR VENOUS ACCESS X2 2. DIRECT PULMONARY ARTERY PRESSURE MEASUREMENTS 3. FLUOROSCOPIC GUIDED PLACEMENT OF BILATERAL PULMONARY ARTERIAL LYTIC INFUSION CATHETERS COMPARISON:  Chest CTA - 01/15/2017 MEDICATIONS: Intravenous Fentanyl and Versed were administered as conscious sedation during continuous monitoring of the patient's level of consciousness and physiological / cardiorespiratory status by the radiology RN, with a total moderate sedation time of 40minutes. CONTRAST:  None used FLUOROSCOPY TIME:  6.9 min  (82  uGym2 DAP) COMPLICATIONS: None immediate TECHNIQUE: Informed written consent was obtained from the patient after a discussion of the risks, benefits and alternatives to treatment. Questions regarding the procedure were encouraged and answered. A timeout was performed prior to the initiation of the procedure. Previous ultrasound had demonstrated right lower extremity DVT. Ultrasound scanning was performed of the right IJ vein demonstrating widely patent. As such, the right internal jugular vein was selected for vascular access. The right neck was prepped and draped in the usual sterile fashion, and a sterile drape was applied covering the operative field. Maximum barrier sterile technique with sterile gowns and gloves were used for the procedure. A timeout was performed prior to the initiation of the procedure. Local anesthesia was provided with 1% lidocaine. Under direct ultrasound guidance, the right internal jugular vein was accessed with a micro puncture sheath ultimately allowing placement of a 6 French  vascular sheath. With the use of a glidewire, an angled pigtail catheter was advanced into the right main pulmonary artery and Pressure measurements were then obtained from the right pulmonary artery. Over a wire, the pigtail catheter was exchanged for a 105/18 cm multi side-hole EKOS ultrasound assisted infusion catheter. Slightly cranial to this initial access, the right internal jugular vein was again accessed with a micropuncture sheath ultimately allowing placement of a 6 French vascular sheath. With the use of a glidewire, a pigtail catheter was advanced into the left main pulmonary artery . Over an exchange length wire, the pigtail catheter was exchanged for a 105/12 cm multi side-hole EKOS ultrasound assisted infusion catheter. A postprocedural fluoroscopic image was obtained of the check demonstrating final catheter positioning. Both vascular sheath were secured at the right neck with interrupted 0 Prolene suture. The external catheter tubing was secured at the right chest and the lytic therapy was initiated. The patient tolerated the procedure well without immediate postprocedural complication. FINDINGS: Directly recorded proximal right pulmonary artery pressure measurements: 46/10 (23) mmHg (normal: < 25/10) Following the procedure, both ultrasound assisted infusion catheter tips terminate within the distal aspects of the bilateral lower lobe sub segmental pulmonary arteries. IMPRESSION: 1. Successful fluoroscopic guided initiation of bilateral ultrasound assisted catheter directed pulmonary arterial lysis for sub massive pulmonary embolism and right-sided heart strain. 2. Markedly elevated pressure measurements within the right main pulmonary artery compatible with critical pulmonary arterial hypertension. PLAN: - The patient will be assessed approximately 12 hours following the initiation of the catheter directed pulmonary arterial lysis for repeat pressure measurements and either removal of the  catheters or continuation of the catheter directed thrombolysis. Electronically Signed   By: Lucrezia Europe M.D.   On: 01/19/2017 12:17   Ir Angiogram Selective Each Additional Vessel  Result Date: 01/19/2017 INDICATION: Acute bilateral pulmonary emboli. Recent new cardiac arrhythmia and progressive right heart strain. EXAM: 1. ULTRASOUND GUIDANCE FOR VENOUS  ACCESS X2 2. DIRECT PULMONARY ARTERY PRESSURE MEASUREMENTS 3. FLUOROSCOPIC GUIDED PLACEMENT OF BILATERAL PULMONARY ARTERIAL LYTIC INFUSION CATHETERS COMPARISON:  Chest CTA - 01/15/2017 MEDICATIONS: Intravenous Fentanyl and Versed were administered as conscious sedation during continuous monitoring of the patient's level of consciousness and physiological / cardiorespiratory status by the radiology RN, with a total moderate sedation time of 37minutes. CONTRAST:  None used FLUOROSCOPY TIME:  6.9 min  (82  uGym2 DAP) COMPLICATIONS: None immediate TECHNIQUE: Informed written consent was obtained from the patient after a discussion of the risks, benefits and alternatives to treatment. Questions regarding the procedure were encouraged and answered. A timeout was performed prior to the initiation of the procedure. Previous ultrasound had demonstrated right lower extremity DVT. Ultrasound scanning was performed of the right IJ vein demonstrating widely patent. As such, the right internal jugular vein was selected for vascular access. The right neck was prepped and draped in the usual sterile fashion, and a sterile drape was applied covering the operative field. Maximum barrier sterile technique with sterile gowns and gloves were used for the procedure. A timeout was performed prior to the initiation of the procedure. Local anesthesia was provided with 1% lidocaine. Under direct ultrasound guidance, the right internal jugular vein was accessed with a micro puncture sheath ultimately allowing placement of a 6 French vascular sheath. With the use of a glidewire, an angled  pigtail catheter was advanced into the right main pulmonary artery and Pressure measurements were then obtained from the right pulmonary artery. Over a wire, the pigtail catheter was exchanged for a 105/18 cm multi side-hole EKOS ultrasound assisted infusion catheter. Slightly cranial to this initial access, the right internal jugular vein was again accessed with a micropuncture sheath ultimately allowing placement of a 6 French vascular sheath. With the use of a glidewire, a pigtail catheter was advanced into the left main pulmonary artery . Over an exchange length wire, the pigtail catheter was exchanged for a 105/12 cm multi side-hole EKOS ultrasound assisted infusion catheter. A postprocedural fluoroscopic image was obtained of the check demonstrating final catheter positioning. Both vascular sheath were secured at the right neck with interrupted 0 Prolene suture. The external catheter tubing was secured at the right chest and the lytic therapy was initiated. The patient tolerated the procedure well without immediate postprocedural complication. FINDINGS: Directly recorded proximal right pulmonary artery pressure measurements: 46/10 (23) mmHg (normal: < 25/10) Following the procedure, both ultrasound assisted infusion catheter tips terminate within the distal aspects of the bilateral lower lobe sub segmental pulmonary arteries. IMPRESSION: 1. Successful fluoroscopic guided initiation of bilateral ultrasound assisted catheter directed pulmonary arterial lysis for sub massive pulmonary embolism and right-sided heart strain. 2. Markedly elevated pressure measurements within the right main pulmonary artery compatible with critical pulmonary arterial hypertension. PLAN: - The patient will be assessed approximately 12 hours following the initiation of the catheter directed pulmonary arterial lysis for repeat pressure measurements and either removal of the catheters or continuation of the catheter directed  thrombolysis. Electronically Signed   By: Lucrezia Europe M.D.   On: 01/19/2017 12:17   Ir Angiogram Selective Each Additional Vessel  Result Date: 01/19/2017 INDICATION: Acute bilateral pulmonary emboli. Recent new cardiac arrhythmia and progressive right heart strain. EXAM: 1. ULTRASOUND GUIDANCE FOR VENOUS ACCESS X2 2. DIRECT PULMONARY ARTERY PRESSURE MEASUREMENTS 3. FLUOROSCOPIC GUIDED PLACEMENT OF BILATERAL PULMONARY ARTERIAL LYTIC INFUSION CATHETERS COMPARISON:  Chest CTA - 01/15/2017 MEDICATIONS: Intravenous Fentanyl and Versed were administered as conscious sedation during continuous monitoring of the patient's  level of consciousness and physiological / cardiorespiratory status by the radiology RN, with a total moderate sedation time of 7minutes. CONTRAST:  None used FLUOROSCOPY TIME:  6.9 min  (82  uGym2 DAP) COMPLICATIONS: None immediate TECHNIQUE: Informed written consent was obtained from the patient after a discussion of the risks, benefits and alternatives to treatment. Questions regarding the procedure were encouraged and answered. A timeout was performed prior to the initiation of the procedure. Previous ultrasound had demonstrated right lower extremity DVT. Ultrasound scanning was performed of the right IJ vein demonstrating widely patent. As such, the right internal jugular vein was selected for vascular access. The right neck was prepped and draped in the usual sterile fashion, and a sterile drape was applied covering the operative field. Maximum barrier sterile technique with sterile gowns and gloves were used for the procedure. A timeout was performed prior to the initiation of the procedure. Local anesthesia was provided with 1% lidocaine. Under direct ultrasound guidance, the right internal jugular vein was accessed with a micro puncture sheath ultimately allowing placement of a 6 French vascular sheath. With the use of a glidewire, an angled pigtail catheter was advanced into the right main  pulmonary artery and Pressure measurements were then obtained from the right pulmonary artery. Over a wire, the pigtail catheter was exchanged for a 105/18 cm multi side-hole EKOS ultrasound assisted infusion catheter. Slightly cranial to this initial access, the right internal jugular vein was again accessed with a micropuncture sheath ultimately allowing placement of a 6 French vascular sheath. With the use of a glidewire, a pigtail catheter was advanced into the left main pulmonary artery . Over an exchange length wire, the pigtail catheter was exchanged for a 105/12 cm multi side-hole EKOS ultrasound assisted infusion catheter. A postprocedural fluoroscopic image was obtained of the check demonstrating final catheter positioning. Both vascular sheath were secured at the right neck with interrupted 0 Prolene suture. The external catheter tubing was secured at the right chest and the lytic therapy was initiated. The patient tolerated the procedure well without immediate postprocedural complication. FINDINGS: Directly recorded proximal right pulmonary artery pressure measurements: 46/10 (23) mmHg (normal: < 25/10) Following the procedure, both ultrasound assisted infusion catheter tips terminate within the distal aspects of the bilateral lower lobe sub segmental pulmonary arteries. IMPRESSION: 1. Successful fluoroscopic guided initiation of bilateral ultrasound assisted catheter directed pulmonary arterial lysis for sub massive pulmonary embolism and right-sided heart strain. 2. Markedly elevated pressure measurements within the right main pulmonary artery compatible with critical pulmonary arterial hypertension. PLAN: - The patient will be assessed approximately 12 hours following the initiation of the catheter directed pulmonary arterial lysis for repeat pressure measurements and either removal of the catheters or continuation of the catheter directed thrombolysis. Electronically Signed   By: Lucrezia Europe M.D.    On: 01/19/2017 12:17   Ir US Guide Vasc Access Right  Result Date: 01/19/2017 INDICATION: Acute bilateral pulmonary emboli. Recent new cardiac arrhythmia and progressive right heart strain. EXAM: 1. ULTRASOUND GUIDANCE FOR VENOUS ACCESS X2 2. DIRECT PULMONARY ARTERY PRESSURE MEASUREMENTS 3. FLUOROSCOPIC GUIDED PLACEMENT OF BILATERAL PULMONARY ARTERIAL LYTIC INFUSION CATHETERS COMPARISON:  Chest CTA - 01/15/2017 MEDICATIONS: Intravenous Fentanyl and Versed were administered as conscious sedation during continuous monitoring of the patient's level of consciousness and physiological / cardiorespiratory status by the radiology RN, with a total moderate sedation time of 78minutes. CONTRAST:  None used FLUOROSCOPY TIME:  6.9 min  (82  uGym2 DAP) COMPLICATIONS: None immediate TECHNIQUE: Informed written consent  was obtained from the patient after a discussion of the risks, benefits and alternatives to treatment. Questions regarding the procedure were encouraged and answered. A timeout was performed prior to the initiation of the procedure. Previous ultrasound had demonstrated right lower extremity DVT. Ultrasound scanning was performed of the right IJ vein demonstrating widely patent. As such, the right internal jugular vein was selected for vascular access. The right neck was prepped and draped in the usual sterile fashion, and a sterile drape was applied covering the operative field. Maximum barrier sterile technique with sterile gowns and gloves were used for the procedure. A timeout was performed prior to the initiation of the procedure. Local anesthesia was provided with 1% lidocaine. Under direct ultrasound guidance, the right internal jugular vein was accessed with a micro puncture sheath ultimately allowing placement of a 6 French vascular sheath. With the use of a glidewire, an angled pigtail catheter was advanced into the right main pulmonary artery and Pressure measurements were then obtained from the right  pulmonary artery. Over a wire, the pigtail catheter was exchanged for a 105/18 cm multi side-hole EKOS ultrasound assisted infusion catheter. Slightly cranial to this initial access, the right internal jugular vein was again accessed with a micropuncture sheath ultimately allowing placement of a 6 French vascular sheath. With the use of a glidewire, a pigtail catheter was advanced into the left main pulmonary artery . Over an exchange length wire, the pigtail catheter was exchanged for a 105/12 cm multi side-hole EKOS ultrasound assisted infusion catheter. A postprocedural fluoroscopic image was obtained of the check demonstrating final catheter positioning. Both vascular sheath were secured at the right neck with interrupted 0 Prolene suture. The external catheter tubing was secured at the right chest and the lytic therapy was initiated. The patient tolerated the procedure well without immediate postprocedural complication. FINDINGS: Directly recorded proximal right pulmonary artery pressure measurements: 46/10 (23) mmHg (normal: < 25/10) Following the procedure, both ultrasound assisted infusion catheter tips terminate within the distal aspects of the bilateral lower lobe sub segmental pulmonary arteries. IMPRESSION: 1. Successful fluoroscopic guided initiation of bilateral ultrasound assisted catheter directed pulmonary arterial lysis for sub massive pulmonary embolism and right-sided heart strain. 2. Markedly elevated pressure measurements within the right main pulmonary artery compatible with critical pulmonary arterial hypertension. PLAN: - The patient will be assessed approximately 12 hours following the initiation of the catheter directed pulmonary arterial lysis for repeat pressure measurements and either removal of the catheters or continuation of the catheter directed thrombolysis. Electronically Signed   By: Lucrezia Europe M.D.   On: 01/19/2017 12:17   Dg Chest Port 1 View  Result Date:  01/20/2017 CLINICAL DATA:  Respiratory failure. EXAM: PORTABLE CHEST 1 VIEW COMPARISON:  01/19/2017. FINDINGS: Interim removal of pulmonary infusion catheters. Stable cardiomegaly. Low lung volumes with basilar subsegmental atelectasis. Small right pleural effusion cannot be excluded. No pneumothorax. Postsurgical changes right shoulder. IMPRESSION: 1. Interim removal of pulmonary infusion catheters. 2. Stable cardiomegaly. 3. Low lung volumes with mild basilar atelectasis . Small right pleural effusion cannot be excluded. Electronically Signed   By: Marcello Moores  Register   On: 01/20/2017 07:05   Dg Chest Port 1 View  Result Date: 01/19/2017 CLINICAL DATA:  82 year old female with acute saddle embolus. Postoperative day 1 status post catheter directed ultrasound-assisted bilateral pulmonary arterial thrombolysis initiated via R IJ x2. Hemoptysis EXAM: PORTABLE CHEST 1 VIEW COMPARISON:  01/15/2017 portable chest and earlier. FINDINGS: Portable AP semi upright view at 0947 hours. Right IJ approach  pulmonary artery infusion catheters are in place. Left chest pacer or resuscitation pads are in place. Stable lung volumes. Stable cardiac size and mediastinal contours. Allowing for portable technique the lungs are clear. IMPRESSION: Right IJ approach pulmonary artery infusion catheters in place, otherwise stable and negative portable radiographic appearance of the chest. Electronically Signed   By: Genevie Ann M.D.   On: 01/19/2017 10:07   Dg Chest Port 1 View  Result Date: 01/15/2017 CLINICAL DATA:  Shortness of breath EXAM: PORTABLE CHEST 1 VIEW COMPARISON:  01/15/2017, FINDINGS: No acute infiltrate or effusion. Stable cardiomediastinal silhouette. No pneumothorax. IMPRESSION: Stable borderline to mild cardiomegaly.  No edema.  No infiltrate. Electronically Signed   By: Donavan Foil M.D.   On: 01/15/2017 23:38   Ir Infusion Thrombol Arterial Initial (ms)  Result Date: 01/19/2017 INDICATION: Acute bilateral pulmonary  emboli. Recent new cardiac arrhythmia and progressive right heart strain. EXAM: 1. ULTRASOUND GUIDANCE FOR VENOUS ACCESS X2 2. DIRECT PULMONARY ARTERY PRESSURE MEASUREMENTS 3. FLUOROSCOPIC GUIDED PLACEMENT OF BILATERAL PULMONARY ARTERIAL LYTIC INFUSION CATHETERS COMPARISON:  Chest CTA - 01/15/2017 MEDICATIONS: Intravenous Fentanyl and Versed were administered as conscious sedation during continuous monitoring of the patient's level of consciousness and physiological / cardiorespiratory status by the radiology RN, with a total moderate sedation time of 30minutes. CONTRAST:  None used FLUOROSCOPY TIME:  6.9 min  (82  uGym2 DAP) COMPLICATIONS: None immediate TECHNIQUE: Informed written consent was obtained from the patient after a discussion of the risks, benefits and alternatives to treatment. Questions regarding the procedure were encouraged and answered. A timeout was performed prior to the initiation of the procedure. Previous ultrasound had demonstrated right lower extremity DVT. Ultrasound scanning was performed of the right IJ vein demonstrating widely patent. As such, the right internal jugular vein was selected for vascular access. The right neck was prepped and draped in the usual sterile fashion, and a sterile drape was applied covering the operative field. Maximum barrier sterile technique with sterile gowns and gloves were used for the procedure. A timeout was performed prior to the initiation of the procedure. Local anesthesia was provided with 1% lidocaine. Under direct ultrasound guidance, the right internal jugular vein was accessed with a micro puncture sheath ultimately allowing placement of a 6 French vascular sheath. With the use of a glidewire, an angled pigtail catheter was advanced into the right main pulmonary artery and Pressure measurements were then obtained from the right pulmonary artery. Over a wire, the pigtail catheter was exchanged for a 105/18 cm multi side-hole EKOS ultrasound  assisted infusion catheter. Slightly cranial to this initial access, the right internal jugular vein was again accessed with a micropuncture sheath ultimately allowing placement of a 6 French vascular sheath. With the use of a glidewire, a pigtail catheter was advanced into the left main pulmonary artery . Over an exchange length wire, the pigtail catheter was exchanged for a 105/12 cm multi side-hole EKOS ultrasound assisted infusion catheter. A postprocedural fluoroscopic image was obtained of the check demonstrating final catheter positioning. Both vascular sheath were secured at the right neck with interrupted 0 Prolene suture. The external catheter tubing was secured at the right chest and the lytic therapy was initiated. The patient tolerated the procedure well without immediate postprocedural complication. FINDINGS: Directly recorded proximal right pulmonary artery pressure measurements: 46/10 (23) mmHg (normal: < 25/10) Following the procedure, both ultrasound assisted infusion catheter tips terminate within the distal aspects of the bilateral lower lobe sub segmental pulmonary arteries. IMPRESSION: 1. Successful fluoroscopic  guided initiation of bilateral ultrasound assisted catheter directed pulmonary arterial lysis for sub massive pulmonary embolism and right-sided heart strain. 2. Markedly elevated pressure measurements within the right main pulmonary artery compatible with critical pulmonary arterial hypertension. PLAN: - The patient will be assessed approximately 12 hours following the initiation of the catheter directed pulmonary arterial lysis for repeat pressure measurements and either removal of the catheters or continuation of the catheter directed thrombolysis. Electronically Signed   By: Lucrezia Europe M.D.   On: 01/19/2017 12:17   Ir Infusion Thrombol Arterial Initial (ms)  Result Date: 01/19/2017 INDICATION: Acute bilateral pulmonary emboli. Recent new cardiac arrhythmia and progressive right  heart strain. EXAM: 1. ULTRASOUND GUIDANCE FOR VENOUS ACCESS X2 2. DIRECT PULMONARY ARTERY PRESSURE MEASUREMENTS 3. FLUOROSCOPIC GUIDED PLACEMENT OF BILATERAL PULMONARY ARTERIAL LYTIC INFUSION CATHETERS COMPARISON:  Chest CTA - 01/15/2017 MEDICATIONS: Intravenous Fentanyl and Versed were administered as conscious sedation during continuous monitoring of the patient's level of consciousness and physiological / cardiorespiratory status by the radiology RN, with a total moderate sedation time of 34minutes. CONTRAST:  None used FLUOROSCOPY TIME:  6.9 min  (82  uGym2 DAP) COMPLICATIONS: None immediate TECHNIQUE: Informed written consent was obtained from the patient after a discussion of the risks, benefits and alternatives to treatment. Questions regarding the procedure were encouraged and answered. A timeout was performed prior to the initiation of the procedure. Previous ultrasound had demonstrated right lower extremity DVT. Ultrasound scanning was performed of the right IJ vein demonstrating widely patent. As such, the right internal jugular vein was selected for vascular access. The right neck was prepped and draped in the usual sterile fashion, and a sterile drape was applied covering the operative field. Maximum barrier sterile technique with sterile gowns and gloves were used for the procedure. A timeout was performed prior to the initiation of the procedure. Local anesthesia was provided with 1% lidocaine. Under direct ultrasound guidance, the right internal jugular vein was accessed with a micro puncture sheath ultimately allowing placement of a 6 French vascular sheath. With the use of a glidewire, an angled pigtail catheter was advanced into the right main pulmonary artery and Pressure measurements were then obtained from the right pulmonary artery. Over a wire, the pigtail catheter was exchanged for a 105/18 cm multi side-hole EKOS ultrasound assisted infusion catheter. Slightly cranial to this initial  access, the right internal jugular vein was again accessed with a micropuncture sheath ultimately allowing placement of a 6 French vascular sheath. With the use of a glidewire, a pigtail catheter was advanced into the left main pulmonary artery . Over an exchange length wire, the pigtail catheter was exchanged for a 105/12 cm multi side-hole EKOS ultrasound assisted infusion catheter. A postprocedural fluoroscopic image was obtained of the check demonstrating final catheter positioning. Both vascular sheath were secured at the right neck with interrupted 0 Prolene suture. The external catheter tubing was secured at the right chest and the lytic therapy was initiated. The patient tolerated the procedure well without immediate postprocedural complication. FINDINGS: Directly recorded proximal right pulmonary artery pressure measurements: 46/10 (23) mmHg (normal: < 25/10) Following the procedure, both ultrasound assisted infusion catheter tips terminate within the distal aspects of the bilateral lower lobe sub segmental pulmonary arteries. IMPRESSION: 1. Successful fluoroscopic guided initiation of bilateral ultrasound assisted catheter directed pulmonary arterial lysis for sub massive pulmonary embolism and right-sided heart strain. 2. Markedly elevated pressure measurements within the right main pulmonary artery compatible with critical pulmonary arterial hypertension. PLAN: - The patient  will be assessed approximately 12 hours following the initiation of the catheter directed pulmonary arterial lysis for repeat pressure measurements and either removal of the catheters or continuation of the catheter directed thrombolysis. Electronically Signed   By: Lucrezia Europe M.D.   On: 01/19/2017 12:17   Ir Jacolyn Reedy F/u Eval Art/ven Final Day (ms)  Result Date: 01/19/2017 CLINICAL DATA:  Sub massive pulmonary emboli, status post 12+ hour ultrasound assisted catheter directed bilateral pulmonary arterial thrombolytic infusion,  complicated only by Mild sporadic hemoptysis. EXAM: IR THROMB F/U EVAL ART/VEN FINAL DAY ANESTHESIA/SEDATION: None used MEDICATIONS: None CONTRAST:  None PROCEDURE: Stable catheter position confirmed on preprocedure chest radiography. Repeat pulmonary arterial pressure measurements obtained bedside . The catheters and sheaths were removed and hemostasis achieved with manual compression. The patient tolerated the procedure well. COMPLICATIONS: None immediate FINDINGS: Postprocedure pulmonary artery pressure 41/15 (25) mmHg. IMPRESSION: 1. Technically successful catheter directed bilateral pulmonary artery ultrasound assisted thrombolytic infusion. Electronically Signed   By: Lucrezia Europe M.D.   On: 01/19/2017 12:04     PERTINENT LAB RESULTS: CBC:  Recent Labs  01/22/17 0138 01/23/17 0237  WBC 6.9 7.5  HGB 11.0* 11.5*  HCT 34.8* 35.1*  PLT 80* 101*   CMET CMP     Component Value Date/Time   NA 135 01/22/2017 0138   K 3.8 01/22/2017 0138   CL 102 01/22/2017 0138   CO2 29 01/22/2017 0138   GLUCOSE 197 (H) 01/22/2017 0138   BUN 18 01/22/2017 0138   CREATININE 1.13 (H) 01/22/2017 0138   CALCIUM 8.5 (L) 01/22/2017 0138   PROT 5.1 (L) 01/22/2017 0138   ALBUMIN 2.1 (L) 01/22/2017 0138   AST 33 01/22/2017 0138   ALT 160 (H) 01/22/2017 0138   ALKPHOS 150 (H) 01/22/2017 0138   BILITOT 0.6 01/22/2017 0138   GFRNONAA 45 (L) 01/22/2017 0138   GFRAA 52 (L) 01/22/2017 0138    GFR Estimated Creatinine Clearance: 48.6 mL/min (A) (by C-G formula based on SCr of 1.13 mg/dL (H)). No results for input(s): LIPASE, AMYLASE in the last 72 hours. No results for input(s): CKTOTAL, CKMB, CKMBINDEX, TROPONINI in the last 72 hours. Invalid input(s): POCBNP No results for input(s): DDIMER in the last 72 hours. No results for input(s): HGBA1C in the last 72 hours. No results for input(s): CHOL, HDL, LDLCALC, TRIG, CHOLHDL, LDLDIRECT in the last 72 hours. No results for input(s): TSH, T4TOTAL, T3FREE,  THYROIDAB in the last 72 hours.  Invalid input(s): FREET3 No results for input(s): VITAMINB12, FOLATE, FERRITIN, TIBC, IRON, RETICCTPCT in the last 72 hours. Coags:  Recent Labs  01/21/17 0229  INR 1.14   Microbiology: Recent Results (from the past 240 hour(s))  MRSA PCR Screening     Status: None   Collection Time: 01/16/17  1:18 AM  Result Value Ref Range Status   MRSA by PCR NEGATIVE NEGATIVE Final    Comment:        The GeneXpert MRSA Assay (FDA approved for NASAL specimens only), is one component of a comprehensive MRSA colonization surveillance program. It is not intended to diagnose MRSA infection nor to guide or monitor treatment for MRSA infections.     FURTHER DISCHARGE INSTRUCTIONS:  Get Medicines reviewed and adjusted: Please take all your medications with you for your next visit with your Primary MD  Laboratory/radiological data: Please request your Primary MD to go over all hospital tests and procedure/radiological results at the follow up, please ask your Primary MD to get all Hospital records sent  to his/her office.  In some cases, they will be blood work, cultures and biopsy results pending at the time of your discharge. Please request that your primary care M.D. goes through all the records of your hospital data and follows up on these results.  Also Note the following: If you experience worsening of your admission symptoms, develop shortness of breath, life threatening emergency, suicidal or homicidal thoughts you must seek medical attention immediately by calling 911 or calling your MD immediately  if symptoms less severe.  You must read complete instructions/literature along with all the possible adverse reactions/side effects for all the Medicines you take and that have been prescribed to you. Take any new Medicines after you have completely understood and accpet all the possible adverse reactions/side effects.   Do not drive when taking Pain  medications or sleeping medications (Benzodaizepines)  Do not take more than prescribed Pain, Sleep and Anxiety Medications. It is not advisable to combine anxiety,sleep and pain medications without talking with your primary care practitioner  Special Instructions: If you have smoked or chewed Tobacco  in the last 2 yrs please stop smoking, stop any regular Alcohol  and or any Recreational drug use.  Wear Seat belts while driving.  Please note: You were cared for by a hospitalist during your hospital stay. Once you are discharged, your primary care physician will handle any further medical issues. Please note that NO REFILLS for any discharge medications will be authorized once you are discharged, as it is imperative that you return to your primary care physician (or establish a relationship with a primary care physician if you do not have one) for your post hospital discharge needs so that they can reassess your need for medications and monitor your lab values.  Total Time spent coordinating discharge including counseling, education and face to face time equals 45 minutes.  SignedOren Binet 01/23/2017 9:31 AM

## 2017-01-23 NOTE — Progress Notes (Signed)
Physical Therapy Treatment Patient Details Name: Jennifer Medina MRN: [id number] DOB: April 24, 1935 Today's Date: 01/23/2017    History of Present Illness  Jennifer Medina is a 82 y/o woman admitted with saddle PE, syncope, who developed hypotension and on 3/10 developed another syncopal episode with VT.    PT Comments    Pt incontinent of urine on arrival, aware but had not asked for assist. Pt states this is baseline and briefs present for gait. Pt with increased activity tolerance and transfers today with ability to ambulate on 2 different trials and stand from bed and chair today. Pt benefits from reassurance and increased time to transition from sitting to standing. Will continue to follow.   HR 92-100 sats 93% on RA   Follow Up Recommendations  SNF;Supervision/Assistance - 24 hour     Equipment Recommendations       Recommendations for Other Services       Precautions / Restrictions Precautions Precautions: Fall Restrictions Weight Bearing Restrictions: No    Mobility  Bed Mobility Overal bed mobility: Needs Assistance Bed Mobility: Rolling;Sidelying to Sit Rolling: Min assist Sidelying to sit: Min assist       General bed mobility comments: use of rail with cues for sequence, rolling bil x 2 for pericare and linen change. Assist to eleavate trunk with cues for sequence.   Transfers Overall transfer level: Needs assistance   Transfers: Sit to/from Stand Sit to Stand: Mod assist;From elevated surface;+2 physical assistance         General transfer comment: pt able to stand with mod assist with mod cues for hand placement, sequence and safety. pt initially fearful with initial sacral elevation with cues for upright posture and position as well as encouragement pt able to achieve fully upright  Ambulation/Gait Ambulation/Gait assistance: Min assist;+2 safety/equipment Ambulation Distance (Feet): 48 Feet Assistive device: Rolling walker (2 wheeled) Gait  Pattern/deviations: Step-through pattern;Decreased stride length;Trunk flexed   Gait velocity interpretation: Below normal speed for age/gender General Gait Details: pt walked 7' then 60' with RW, chair to follow and +2 for safety. Cues for position in RW and safety with encouragement to progress gait   Stairs            Wheelchair Mobility    Modified Rankin (Stroke Patients Only)       Balance Overall balance assessment: Needs assistance   Sitting balance-Leahy Scale: Fair       Standing balance-Leahy Scale: Poor                      Cognition Arousal/Alertness: Awake/alert Behavior During Therapy: WFL for tasks assessed/performed Overall Cognitive Status: Within Functional Limits for tasks assessed                      Exercises      General Comments        Pertinent Vitals/Pain Pain Score: 4  Pain Location: back Pain Descriptors / Indicators: Sore Pain Intervention(s): Limited activity within patient's tolerance;Repositioned    Home Living                      Prior Function            PT Goals (current goals can now be found in the care plan section) Progress towards PT goals: Progressing toward goals    Frequency           PT Plan Current plan remains appropriate    Co-evaluation  End of Session Equipment Utilized During Treatment: Gait belt Activity Tolerance: Patient tolerated treatment well Patient left: in chair;with call bell/phone within [place];with chair alarm set Nurse Communication: Mobility status PT Visit Diagnosis: Unsteadiness on feet (R26.81);Difficulty in walking, not elsewhere classified (R26.2);Muscle weakness (generalized) (M62.81)     Time: 2010-0712 PT Time Calculation (min) (ACUTE ONLY): 31 min  Charges:  $Gait Training: 8-22 mins $Therapeutic Activity: 8-22 mins                    G Codes:       Jennifer Medina 2017/01/26, 1:05 PM  Jennifer Medina,  [place]

## 2017-01-23 NOTE — Care Management Note (Signed)
Case Management Note Marvetta Gibbons RN, BSN Unit 2W-Case Manager [phone]   Patient Details  Name: Jennifer Medina MRN: [id number] Date of Birth: 1935/06/26  Subjective/Objective: Pt admitted with  PE                 Action/Plan: PTA pt lived at home- plan for SNF- CSW following for placement needs  Expected Discharge Date:  01/23/17               Expected Discharge Plan:  [facility]  In-House Referral:  Clinical Social Work  Discharge planning Services  CM Consult  Post Acute Care Choice:  NA Choice offered to:  NA  DME Arranged:    DME Agency:     HH Arranged:  NA HH Agency:     Status of Service:  Completed, signed off  If discussed at [employer] of Stay Meetings, dates discussed:  3/13  Discharge Disposition: skilled facility   Additional Comments:  Dawayne Patricia, RN 01/23/2017, 10:47 AM

## 2017-01-29 DIAGNOSIS — N179 Acute kidney failure, unspecified: Secondary | ICD-10-CM | POA: Diagnosis not present

## 2017-01-29 DIAGNOSIS — I82401 Acute embolism and thrombosis of unspecified deep veins of right lower extremity: Secondary | ICD-10-CM | POA: Diagnosis not present

## 2017-01-29 DIAGNOSIS — I4891 Unspecified atrial fibrillation: Secondary | ICD-10-CM | POA: Diagnosis not present

## 2017-01-29 DIAGNOSIS — I2699 Other pulmonary embolism without acute cor pulmonale: Secondary | ICD-10-CM | POA: Diagnosis not present

## 2017-02-04 ENCOUNTER — Inpatient Hospital Stay: Payer: Medicare PPO | Admitting: Adult Health

## 2017-02-04 DIAGNOSIS — E785 Hyperlipidemia, unspecified: Secondary | ICD-10-CM | POA: Diagnosis not present

## 2017-02-04 DIAGNOSIS — I509 Heart failure, unspecified: Secondary | ICD-10-CM | POA: Diagnosis not present

## 2017-02-04 DIAGNOSIS — I4891 Unspecified atrial fibrillation: Secondary | ICD-10-CM | POA: Diagnosis not present

## 2017-02-04 DIAGNOSIS — I2699 Other pulmonary embolism without acute cor pulmonale: Secondary | ICD-10-CM | POA: Diagnosis not present

## 2017-02-05 ENCOUNTER — Ambulatory Visit: Payer: Medicare PPO | Admitting: Cardiology

## 2017-02-05 DIAGNOSIS — I509 Heart failure, unspecified: Secondary | ICD-10-CM | POA: Diagnosis not present

## 2017-02-05 DIAGNOSIS — I82401 Acute embolism and thrombosis of unspecified deep veins of right lower extremity: Secondary | ICD-10-CM | POA: Diagnosis not present

## 2017-02-05 DIAGNOSIS — I2602 Saddle embolus of pulmonary artery with acute cor pulmonale: Secondary | ICD-10-CM | POA: Diagnosis not present

## 2017-02-05 DIAGNOSIS — R042 Hemoptysis: Secondary | ICD-10-CM | POA: Diagnosis not present

## 2017-02-05 DIAGNOSIS — Z7901 Long term (current) use of anticoagulants: Secondary | ICD-10-CM | POA: Diagnosis not present

## 2017-02-05 DIAGNOSIS — I4891 Unspecified atrial fibrillation: Secondary | ICD-10-CM | POA: Diagnosis not present

## 2017-02-06 ENCOUNTER — Encounter: Payer: Self-pay | Admitting: Cardiology

## 2017-02-06 ENCOUNTER — Ambulatory Visit (INDEPENDENT_AMBULATORY_CARE_PROVIDER_SITE_OTHER): Payer: Medicare PPO | Admitting: Cardiology

## 2017-02-06 DIAGNOSIS — I4891 Unspecified atrial fibrillation: Secondary | ICD-10-CM

## 2017-02-06 DIAGNOSIS — I82409 Acute embolism and thrombosis of unspecified deep veins of unspecified lower extremity: Secondary | ICD-10-CM | POA: Insufficient documentation

## 2017-02-06 DIAGNOSIS — I82401 Acute embolism and thrombosis of unspecified deep veins of right lower extremity: Secondary | ICD-10-CM | POA: Diagnosis not present

## 2017-02-06 DIAGNOSIS — I2699 Other pulmonary embolism without acute cor pulmonale: Secondary | ICD-10-CM

## 2017-02-06 DIAGNOSIS — Z7901 Long term (current) use of anticoagulants: Secondary | ICD-10-CM | POA: Insufficient documentation

## 2017-02-06 HISTORY — DX: Morbid (severe) obesity due to excess calories: E66.01

## 2017-02-06 HISTORY — DX: Long term (current) use of anticoagulants: Z79.01

## 2017-02-06 HISTORY — DX: Acute embolism and thrombosis of unspecified deep veins of unspecified lower extremity: I82.409

## 2017-02-06 NOTE — Assessment & Plan Note (Signed)
Rt LE DVT

## 2017-02-06 NOTE — Assessment & Plan Note (Signed)
BMI 38 

## 2017-02-06 NOTE — Assessment & Plan Note (Signed)
Pt admitted 01/15/17 with submassive pulmonary embolism. She was treated conservatively secondary to a history of ICH in 2009.

## 2017-02-06 NOTE — Patient Instructions (Signed)
Medication Instructions:  Your physician recommends that you continue on your current medications as directed. Please refer to the Current Medication list given to you today.  Labwork: None   Testing/Procedures: None   Follow-Up: PLEASE CONTACT DR MUNLEY'S OFFICE AND SCHEDULE A FOLLOW UP APPOINTMENT  Any Other Special Instructions Will Be Listed Below (If Applicable).  If you need a refill on your cardiac medications before your next appointment, please call your pharmacy.

## 2017-02-06 NOTE — Assessment & Plan Note (Signed)
She was on Amiodarone during her hospitalization but converted to NSR and this was discontinued at discharge

## 2017-02-06 NOTE — Progress Notes (Signed)
02/06/2017 Jennifer Medina   06-12-1935  [id number]  Primary Physician Nicoletta Dress, MD Primary Cardiologist: Dr Bettina Gavia  HPI:  Pleasant 82 y/o, recently widowed obese female, lives alone in her own home, from [place], followed in the past by Dr Bettina Gavia. She was admitted 01/15/17 as a transfer from [facility] after she had a submassive saddle pumonary embolism. The pt says she had problems with her Rt knee earlier this year. She had fluid drawn off and was getting physical therapy at home. On the day of admission she was feeling well, got up to the sink and had SOB and diaphoresis. She called her neighbor who found her on the floor and called EMS. She was taken to [facility] then transferred to [facility]. CCM considered directed thrombolysis but the pt related a history of ICH and stroke in 2009. She was treated conservatively. Her course was complicated by CHF and PAF. She was on Amiodarone briefly but this was stopped before discharge. She is in the office today for follow up. She was discharge to a rehab facility. She is doing well, she still gets SOB with PT.    Current Outpatient Prescriptions  Medication Sig Dispense Refill  . acetaminophen (TYLENOL) 325 MG tablet Take 650 mg by mouth every 4 (four) hours as needed for moderate pain or headache.     Marland Kitchen atorvastatin (LIPITOR) 40 MG tablet Take 40 mg by mouth at bedtime.    . Camphor (JOINTFLEX EX) Apply 1 application topically as needed (for pain).    . furosemide (LASIX) 40 MG tablet Take 40 mg by mouth daily.    Marland Kitchen guaiFENesin (MUCINEX) 600 MG 12 hr tablet Take 600 mg by mouth as directed.    . latanoprost (XALATAN) 0.005 % ophthalmic solution Place 1 drop into both eyes at bedtime.    Marland Kitchen loperamide (IMODIUM A-D) 2 MG tablet Take 2 mg by mouth 4 (four) times daily as needed for diarrhea or loose stools.    Marland Kitchen LORazepam (ATIVAN) 0.5 MG tablet Take 1 tablet (0.5 mg total) by mouth every 8 (eight) hours as needed for anxiety. 10 tablet 0  . metoprolol succinate  (TOPROL-XL) 25 MG 24 hr tablet Take 25 mg by mouth daily.    Marland Kitchen omeprazole (PRILOSEC) 40 MG capsule Take 40 mg by mouth daily.    . potassium chloride SA (K-DUR,KLOR-CON) 20 MEQ tablet Take 20 mEq by mouth daily.    . Rivaroxaban (XARELTO) 15 MG TABS tablet Take 1 tablet (15 mg total) by mouth 2 (two) times daily with a meal. 21 days from 3/13 42 tablet   . [START ON 02/11/2017] rivaroxaban (XARELTO) 20 MG TABS tablet Take 1 tablet (20 mg total) by mouth daily with supper. 20 mg, Oral, Daily with supper, First dose on Tue 02/11/17 at 1700 30 tablet   . sodium chloride (OCEAN) 0.65 % SOLN nasal spray Place 1 spray into both nostrils as needed for congestion. Please use 10 times a day  0  . traMADol (ULTRAM) 50 MG tablet Take 1 tablet (50 mg total) by mouth every 6 (six) hours as needed for moderate pain. 10 tablet 0   No current facility-administered medications for this visit.     Allergies  Allergen Reactions  . Aspirin Other (See Comments)    GI bleed  . Nsaids Other (See Comments)    GI bleed  . Penicillins Shortness Of Breath and Rash  . Simvastatin Nausea Only  . Vancomycin Rash    Past Medical  History:  Diagnosis Date  . CHF (congestive heart failure) ([place])   . ICH (intracerebral hemorrhage) ([place]) 2009  . Pulmonary embolism ([place]) 01/15/2017   saddle embolism    Social History   Social History  . Marital status: Unknown    Spouse name: N/A  . Number of children: N/A  . Years of education: N/A   Occupational History  . Not on file.   Social History Main Topics  . Smoking status: Never Smoker  . Smokeless tobacco: Never Used  . Alcohol use No  . Drug use: No  . Sexual activity: Not on file   Other Topics Concern  . Not on file   Social History Narrative  . No narrative on file     Family History  Problem Relation Age of Onset  . CAD Father      Review of Systems: General: negative for chills, fever, night sweats or weight changes.  Cardiovascular: negative  for chest pain, dyspnea on exertion, edema, orthopnea, palpitations, paroxysmal nocturnal dyspnea or shortness of breath Dermatological: negative for rash Respiratory: negative for cough or wheezing Urologic: negative for hematuria Abdominal: negative for nausea, vomiting, diarrhea, bright red blood per rectum, melena, or hematemesis Neurologic: negative for visual changes, syncope, or dizziness Chronic LE edema All other systems reviewed and are otherwise negative except as noted above.    Blood pressure 137/76, pulse 69, height 5\' 4"  (1.626 m), weight 225 lb 12.8 oz (102.4 kg), SpO2 96 %.  General appearance: alert, cooperative, no distress, morbidly obese and in wheel chair Lungs: clear to auscultation bilaterally Heart: regular rate and rhythm Extremities: no edema Neurologic: Grossly normal   ASSESSMENT AND PLAN:   Acute massive pulmonary embolism ([place]) Pt admitted 01/15/17 with submassive pulmonary embolism. She was treated conservatively secondary to a history of ICH in 2009.   Atrial fibrillation with RVR ([place]) She was on Amiodarone during her hospitalization but converted to NSR and this was discontinued at discharge. NSR on exam  DVT (deep venous thrombosis) (HCC) Rt LE DVT  Chronic anticoagulation PE treatment dose Xarelto  Morbid obesity (HCC) BMI 38   PLAN  She can follow up with dr Bettina Gavia in [place]. She'll need a f/u echo in 3 months. We will be happy to see again as needed.   Kerin Ransom PA-C 02/06/2017 12:17 PM

## 2017-02-06 NOTE — Assessment & Plan Note (Signed)
PE treatment dose Xarelto

## 2017-02-12 DIAGNOSIS — I4891 Unspecified atrial fibrillation: Secondary | ICD-10-CM | POA: Diagnosis not present

## 2017-02-12 DIAGNOSIS — I82401 Acute embolism and thrombosis of unspecified deep veins of right lower extremity: Secondary | ICD-10-CM | POA: Diagnosis not present

## 2017-02-12 DIAGNOSIS — R042 Hemoptysis: Secondary | ICD-10-CM | POA: Diagnosis not present

## 2017-02-12 DIAGNOSIS — M79672 Pain in left foot: Secondary | ICD-10-CM | POA: Diagnosis not present

## 2017-02-18 ENCOUNTER — Encounter: Payer: Self-pay | Admitting: Adult Health

## 2017-02-18 ENCOUNTER — Ambulatory Visit (INDEPENDENT_AMBULATORY_CARE_PROVIDER_SITE_OTHER): Payer: Medicare PPO | Admitting: Adult Health

## 2017-02-18 DIAGNOSIS — Z7901 Long term (current) use of anticoagulants: Secondary | ICD-10-CM

## 2017-02-18 DIAGNOSIS — I2699 Other pulmonary embolism without acute cor pulmonale: Secondary | ICD-10-CM

## 2017-02-18 DIAGNOSIS — I82401 Acute embolism and thrombosis of unspecified deep veins of right lower extremity: Secondary | ICD-10-CM

## 2017-02-18 DIAGNOSIS — I5032 Chronic diastolic (congestive) heart failure: Secondary | ICD-10-CM

## 2017-02-18 NOTE — Assessment & Plan Note (Addendum)
Appears to be a provoked PE /Right DVT (after right leg /knee injury - Jan 2018 )  Hematology records requested .  Continue on Xarelto , pt education given  Most likely will need at least 6-12 months. Would set up venous doppler at 6 month to make sure DVT has resolved prior to stopping anticoagulation and then make decision .  Needs echo in 3 months - wants this done by Cardiology in [place] that she is established with .  Suggest if PAP are still elevated can consider sleep study ( to look for causes of PAH) .   Plan  Patient Instructions  Follow up with Dr. Bettina Gavia , Cardiology in [place] . Will need echo in June to follow up elevated pressure in heart.  Continue on Xarelto 20mg  daily .  Avoid all NSAID - ie advil, aleve, ibuprofen , etc.  Follow up with Dr. Chase Caller in 3 months and As needed   Please contact office for sooner follow up if symptoms do not improve or worsen or seek emergency care

## 2017-02-18 NOTE — Assessment & Plan Note (Signed)
Pt education

## 2017-02-18 NOTE — Assessment & Plan Note (Signed)
Appears to be a provoked PE /Right DVT (after right leg /knee injury - Jan 2018 )  Hematology records requested .  Continue on Xarelto , pt education given  Most likely will need at least 6-12 months. Would set up venous doppler at 6 month to make sure DVT has resolved prior to stopping anticoagulation and then make decision .  Needs echo in 3 months - wants this done by Cardiology in [place] that she is established with .  Suggest if PAP are still elevated can consider sleep study ( to look for causes of PAH) .   Plan  Patient Instructions  Follow up with Dr. Bettina Gavia , Cardiology in [place] . Will need echo in June to follow up elevated pressure in heart.  Continue on Xarelto 20mg  daily .  Avoid all NSAID - ie advil, aleve, ibuprofen , etc.  Follow up with Dr. Chase Caller in 3 months and As needed   Please contact office for sooner follow up if symptoms do not improve or worsen or seek emergency care

## 2017-02-18 NOTE — Patient Instructions (Addendum)
Follow up with Dr. Bettina Gavia , Cardiology in [place] . Will need echo in June to follow up elevated pressure in heart.  Continue on Xarelto 20mg  daily .  Avoid all NSAID - ie advil, aleve, ibuprofen , etc.  Follow up with Dr. Chase Caller in 3 months and As needed   Please contact office for sooner follow up if symptoms do not improve or worsen or seek emergency care

## 2017-02-18 NOTE — Progress Notes (Signed)
@Patient  ID: Jennifer Jennifer, female    DOB: 25-Jan-1935, 82 y.o.   MRN: [id number]  Chief Complaint  Patient presents with  . Follow-up    PE     Referring provider: No ref. provider found  HPI: 82 yo female never smoker seen for pulmonary consult during hospitalization for PE 01/2017 .  Pt was admitted with syncope w/ VT found to have saddle PE requiring EKOS protocol.   TESTS/Events:  3/10 > Syncope/VT > EKOS  CTA-Pulmonary 3/7 > PE ECHO 3/8 > LVEF 77-41%, grade 1 diastolic dysfunction, RV dilated, RA mod dilated, pa peak 64 VAS [place] Lower Extremity 3/8 > Right lower extremity DVT   02/18/2017 Follow up : Jennifer Jennifer follow up -PE /DVT  Pt returns for a post Jennifer follow up . She was admitted 01/2017 with syncope , hypotension found to have saddle PE and RLE DVT . She had VT episode , due instability underwent EKOS protocol . She was treated with HEP / Xarelto  Discharged on Xarelto . Currently on Xarelto 20mg  daily .  She is feeling better with no further hemoptysis . Breathing is better with less dyspnea.  On Echo , her PAP were elevated higher than expected with PE and was recommended to have OP sleep study to determine if sleep apnea might be contributing to pulmonary HTN .  She denies daytime sleepiness or snoring and wants to hold off on this at this time.  She says she was seen by Hematology at [facility] since discharge with Jennifer Jennifer .  Records not available at todays visit. Says she was told that PE/DVT is most likely from right leg injury that she experienced in Jan 2018 . Says she twisted her knee in Jan with severe swelling in right knee that required fluid being taken off. She had to go to rehab due to decreased ability to walk and was down for several weeks bc of this .   Allergies  Allergen Reactions  . Aspirin Other (See Comments)    GI bleed  . Nsaids Other (See Comments)    GI bleed  . Penicillins Shortness Of Breath and Rash  . Simvastatin Nausea Only  .  Vancomycin Rash    Immunization History  Administered Date(s) Administered  . Influenza, High Dose Seasonal PF 10/11/2016  . Pneumococcal Conjugate-13 11/12/2015    Past Medical History:  Diagnosis Date  . CHF (congestive heart failure) ([place])   . ICH (intracerebral hemorrhage) ([place]) 2009  . Pulmonary embolism ([place]) 01/15/2017   saddle embolism    Tobacco History: History  Smoking Status  . Never Smoker  Smokeless Tobacco  . Never Used   Counseling given: Not Answered   Outpatient Encounter Prescriptions as of 02/18/2017  Medication Sig  . acetaminophen (TYLENOL) 325 MG tablet Take 650 mg by mouth every 4 (four) hours as needed for moderate pain or headache.   Marland Kitchen atorvastatin (LIPITOR) 40 MG tablet Take 40 mg by mouth at bedtime.  . Camphor (JOINTFLEX EX) Apply 1 application topically as needed (for pain).  . furosemide (LASIX) 40 MG tablet Take 40 mg by mouth daily.  Marland Kitchen guaiFENesin (MUCINEX) 600 MG 12 hr tablet Take 600 mg by mouth as directed.  . latanoprost (XALATAN) 0.005 % ophthalmic solution Place 1 drop into both eyes at bedtime.  Marland Kitchen loperamide (IMODIUM A-D) 2 MG tablet Take 2 mg by mouth 4 (four) times daily as needed for diarrhea or loose stools.  Marland Kitchen LORazepam (ATIVAN) 0.5 MG tablet  Take 1 tablet (0.5 mg total) by mouth every 8 (eight) hours as needed for anxiety.  . metoprolol succinate (TOPROL-XL) 25 MG 24 hr tablet Take 25 mg by mouth daily.  . Omeprazole 20 MG TBEC Take 20 mg by mouth daily.   . potassium chloride SA (K-DUR,KLOR-CON) 20 MEQ tablet Take 20 mEq by mouth daily.  . rivaroxaban (XARELTO) 20 MG TABS tablet Take 1 tablet (20 mg total) by mouth daily with supper. 20 mg, Oral, Daily with supper, First dose on Tue 02/11/17 at 1700  . sodium chloride (OCEAN) 0.65 % SOLN nasal spray Place 1 spray into both nostrils as needed for congestion. Please use 10 times a day  . traMADol (ULTRAM) 50 MG tablet Take 1 tablet (50 mg total) by mouth every 6 (six) hours as needed  for moderate pain.  . [DISCONTINUED] Rivaroxaban (XARELTO) 15 MG TABS tablet Take 1 tablet (15 mg total) by mouth 2 (two) times daily with a meal. 21 days from 3/13 (Patient not taking: Reported on 02/18/2017)   No facility-administered encounter medications on file as of 02/18/2017.      Review of Systems  Constitutional:   No  weight loss, night sweats,  Fevers, chills,  +fatigue, or  lassitude.  HEENT:   No headaches,  Difficulty swallowing,  Tooth/dental problems, or  Sore throat,                No sneezing, itching, ear ache, nasal congestion, post nasal drip,   CV:  No chest pain,  Orthopnea, PND, swelling in lower extremities, anasarca, dizziness, palpitations, syncope.   GI  No heartburn, indigestion, abdominal pain, nausea, vomiting, diarrhea, change in bowel habits, loss of appetite, bloody stools.   Resp:    No chest wall deformity  Skin: no rash or lesions.  GU: no dysuria, change in color of urine, no urgency or frequency.  No flank pain, no hematuria   MS:  No joint pain or swelling.  No decreased range of motion.  No back pain.    Physical Exam  BP 104/66 (BP Location: Left Arm, Cuff Size: Large)   Pulse 63   Ht 5\' 4"  (1.626 m)   Wt 224 lb (101.6 kg)   SpO2 97%   BMI 38.45 kg/m   GEN: A/Ox3; pleasant , NAD, obese , wc    HEENT:  [place]/AT,  EACs-clear, TMs-wnl, NOSE-clear, THROAT-clear, no lesions, no postnasal drip or exudate noted.   NECK:  Supple w/ fair ROM; no JVD; normal carotid impulses w/o bruits; no thyromegaly or nodules palpated; no lymphadenopathy.    RESP  Clear  P & A; w/o, wheezes/ rales/ or rhonchi. no accessory muscle use, no dullness to percussion  CARD:  RRR, no m/r/g, tr -1 +  peripheral edema, pulses intact, no cyanosis or clubbing.  GI:   Soft & nt; nml bowel sounds; no organomegaly or masses detected.   Musco: Warm bil, no deformities or joint swelling noted.   Neuro: alert, no focal deficits noted.    Skin: Warm, no lesions or  rashes    Lab Results:  CBC    Component Value Date/Time   WBC 7.5 01/23/2017 0237   RBC 3.75 (L) 01/23/2017 0237   HGB 11.5 (L) 01/23/2017 0237   HCT 35.1 (L) 01/23/2017 0237   PLT 101 (L) 01/23/2017 0237   MCV 93.6 01/23/2017 0237   MCH 30.7 01/23/2017 0237   MCHC 32.8 01/23/2017 0237   RDW 16.2 (H) 01/23/2017 4650  LYMPHSABS 1.1 01/23/2017 0237   MONOABS 0.8 01/23/2017 0237   EOSABS 0.1 01/23/2017 0237   BASOSABS 0.0 01/23/2017 0237    BMET    Component Value Date/Time   NA 135 01/22/2017 0138   K 3.8 01/22/2017 0138   CL 102 01/22/2017 0138   CO2 29 01/22/2017 0138   GLUCOSE 197 (H) 01/22/2017 0138   BUN 18 01/22/2017 0138   CREATININE 1.13 (H) 01/22/2017 0138   CALCIUM 8.5 (L) 01/22/2017 0138   GFRNONAA 45 (L) 01/22/2017 0138   GFRAA 52 (L) 01/22/2017 0138    BNP    Component Value Date/Time   BNP 841.8 (H) 01/15/2017 2338    ProBNP No results found for: PROBNP  Imaging: Dg Chest Port 1 View  Result Date: 01/20/2017 CLINICAL DATA:  Respiratory failure. EXAM: PORTABLE CHEST 1 VIEW COMPARISON:  01/19/2017. FINDINGS: Interim removal of pulmonary infusion catheters. Stable cardiomegaly. Low lung volumes with basilar subsegmental atelectasis. Small right pleural effusion cannot be excluded. No pneumothorax. Postsurgical changes right shoulder. IMPRESSION: 1. Interim removal of pulmonary infusion catheters. 2. Stable cardiomegaly. 3. Low lung volumes with mild basilar atelectasis . Small right pleural effusion cannot be excluded. Electronically Signed   By: Marcello Moores  Register   On: 01/20/2017 07:05     Assessment & Plan:   Acute massive pulmonary embolism ([place]) Appears to be a provoked PE /Right DVT (after right leg /knee injury - Jan 2018 )  Hematology records requested .  Continue on Xarelto , pt education given  Most likely will need at least 6-12 months. Would set up venous doppler at 6 month to make sure DVT has resolved prior to stopping  anticoagulation and then make decision .  Needs echo in 3 months - wants this done by Cardiology in [place] that she is established with .  Suggest if PAP are still elevated can consider sleep study ( to look for causes of PAH) .   Plan  Patient Instructions  Follow up with Jennifer. Bettina Gavia , Cardiology in [place] . Will need echo in June to follow up elevated pressure in heart.  Continue on Xarelto 20mg  daily .  Avoid all NSAID - ie advil, aleve, ibuprofen , etc.  Follow up with Jennifer. Chase Caller in 3 months and As needed   Please contact office for sooner follow up if symptoms do not improve or worsen or seek emergency care        DVT (deep venous thrombosis) ([place]) Appears to be a provoked PE /Right DVT (after right leg /knee injury - Jan 2018 )  Hematology records requested .  Continue on Xarelto , pt education given  Most likely will need at least 6-12 months. Would set up venous doppler at 6 month to make sure DVT has resolved prior to stopping anticoagulation and then make decision .  Needs echo in 3 months - wants this done by Cardiology in [place] that she is established with .  Suggest if PAP are still elevated can consider sleep study ( to look for causes of PAH) .   Plan  Patient Instructions  Follow up with Jennifer. Bettina Gavia , Cardiology in [place] . Will need echo in June to follow up elevated pressure in heart.  Continue on Xarelto 20mg  daily .  Avoid all NSAID - ie advil, aleve, ibuprofen , etc.  Follow up with Jennifer. Chase Caller in 3 months and As needed   Please contact office for sooner follow up if symptoms do not improve or worsen or  seek emergency care        Chronic anticoagulation Pt education   CHF (congestive heart failure) ([place]) Cont follow up with cards      Rexene Edison, NP 02/18/2017

## 2017-02-18 NOTE — Assessment & Plan Note (Signed)
Cont follow up with cards  

## 2017-02-20 MED ORDER — RIVAROXABAN 20 MG PO TABS: 20.0000 mg | ORAL_TABLET | Freq: Every day | ORAL | 5 refills | Status: DC

## 2017-02-20 NOTE — Addendum Note (Signed)
Addended by: Parke Poisson E on: 02/20/2017 04:55 PM   Modules accepted: Orders

## 2017-02-21 DIAGNOSIS — D696 Thrombocytopenia, unspecified: Secondary | ICD-10-CM | POA: Diagnosis not present

## 2017-02-21 DIAGNOSIS — I480 Paroxysmal atrial fibrillation: Secondary | ICD-10-CM | POA: Diagnosis not present

## 2017-02-21 DIAGNOSIS — I2602 Saddle embolus of pulmonary artery with acute cor pulmonale: Secondary | ICD-10-CM | POA: Diagnosis not present

## 2017-02-21 DIAGNOSIS — M549 Dorsalgia, unspecified: Secondary | ICD-10-CM | POA: Diagnosis not present

## 2017-02-21 DIAGNOSIS — I5032 Chronic diastolic (congestive) heart failure: Secondary | ICD-10-CM | POA: Diagnosis not present

## 2017-02-21 DIAGNOSIS — I82401 Acute embolism and thrombosis of unspecified deep veins of right lower extremity: Secondary | ICD-10-CM | POA: Diagnosis not present

## 2017-02-24 DIAGNOSIS — I5032 Chronic diastolic (congestive) heart failure: Secondary | ICD-10-CM | POA: Diagnosis not present

## 2017-02-24 DIAGNOSIS — I480 Paroxysmal atrial fibrillation: Secondary | ICD-10-CM | POA: Diagnosis not present

## 2017-02-24 DIAGNOSIS — I2602 Saddle embolus of pulmonary artery with acute cor pulmonale: Secondary | ICD-10-CM | POA: Diagnosis not present

## 2017-02-24 DIAGNOSIS — D696 Thrombocytopenia, unspecified: Secondary | ICD-10-CM | POA: Diagnosis not present

## 2017-02-24 DIAGNOSIS — I82401 Acute embolism and thrombosis of unspecified deep veins of right lower extremity: Secondary | ICD-10-CM | POA: Diagnosis not present

## 2017-02-24 DIAGNOSIS — M549 Dorsalgia, unspecified: Secondary | ICD-10-CM | POA: Diagnosis not present

## 2017-02-25 DIAGNOSIS — I480 Paroxysmal atrial fibrillation: Secondary | ICD-10-CM | POA: Diagnosis not present

## 2017-02-25 DIAGNOSIS — Z6841 Body Mass Index (BMI) 40.0 and over, adult: Secondary | ICD-10-CM | POA: Diagnosis not present

## 2017-02-25 DIAGNOSIS — I2699 Other pulmonary embolism without acute cor pulmonale: Secondary | ICD-10-CM | POA: Diagnosis not present

## 2017-02-25 DIAGNOSIS — I5032 Chronic diastolic (congestive) heart failure: Secondary | ICD-10-CM | POA: Diagnosis not present

## 2017-02-25 DIAGNOSIS — I82401 Acute embolism and thrombosis of unspecified deep veins of right lower extremity: Secondary | ICD-10-CM | POA: Diagnosis not present

## 2017-02-26 DIAGNOSIS — I480 Paroxysmal atrial fibrillation: Secondary | ICD-10-CM | POA: Diagnosis not present

## 2017-02-26 DIAGNOSIS — I2602 Saddle embolus of pulmonary artery with acute cor pulmonale: Secondary | ICD-10-CM | POA: Diagnosis not present

## 2017-02-26 DIAGNOSIS — D696 Thrombocytopenia, unspecified: Secondary | ICD-10-CM | POA: Diagnosis not present

## 2017-02-26 DIAGNOSIS — M549 Dorsalgia, unspecified: Secondary | ICD-10-CM | POA: Diagnosis not present

## 2017-02-26 DIAGNOSIS — I82401 Acute embolism and thrombosis of unspecified deep veins of right lower extremity: Secondary | ICD-10-CM | POA: Diagnosis not present

## 2017-02-26 DIAGNOSIS — I5032 Chronic diastolic (congestive) heart failure: Secondary | ICD-10-CM | POA: Diagnosis not present

## 2017-02-27 DIAGNOSIS — M549 Dorsalgia, unspecified: Secondary | ICD-10-CM | POA: Diagnosis not present

## 2017-02-27 DIAGNOSIS — D696 Thrombocytopenia, unspecified: Secondary | ICD-10-CM | POA: Diagnosis not present

## 2017-02-27 DIAGNOSIS — I5032 Chronic diastolic (congestive) heart failure: Secondary | ICD-10-CM | POA: Diagnosis not present

## 2017-02-27 DIAGNOSIS — I2602 Saddle embolus of pulmonary artery with acute cor pulmonale: Secondary | ICD-10-CM | POA: Diagnosis not present

## 2017-02-27 DIAGNOSIS — I480 Paroxysmal atrial fibrillation: Secondary | ICD-10-CM | POA: Diagnosis not present

## 2017-02-27 DIAGNOSIS — I82401 Acute embolism and thrombosis of unspecified deep veins of right lower extremity: Secondary | ICD-10-CM | POA: Diagnosis not present

## 2017-03-03 DIAGNOSIS — M79672 Pain in left foot: Secondary | ICD-10-CM | POA: Diagnosis not present

## 2017-03-03 DIAGNOSIS — L03116 Cellulitis of left lower limb: Secondary | ICD-10-CM | POA: Diagnosis not present

## 2017-03-03 DIAGNOSIS — M549 Dorsalgia, unspecified: Secondary | ICD-10-CM | POA: Diagnosis not present

## 2017-03-03 DIAGNOSIS — M7989 Other specified soft tissue disorders: Secondary | ICD-10-CM | POA: Diagnosis not present

## 2017-03-03 DIAGNOSIS — I82441 Acute embolism and thrombosis of right tibial vein: Secondary | ICD-10-CM | POA: Diagnosis not present

## 2017-03-03 DIAGNOSIS — I5032 Chronic diastolic (congestive) heart failure: Secondary | ICD-10-CM | POA: Diagnosis not present

## 2017-03-03 DIAGNOSIS — I2602 Saddle embolus of pulmonary artery with acute cor pulmonale: Secondary | ICD-10-CM | POA: Diagnosis not present

## 2017-03-03 DIAGNOSIS — D696 Thrombocytopenia, unspecified: Secondary | ICD-10-CM | POA: Diagnosis not present

## 2017-03-03 DIAGNOSIS — I480 Paroxysmal atrial fibrillation: Secondary | ICD-10-CM | POA: Diagnosis not present

## 2017-03-03 DIAGNOSIS — R600 Localized edema: Secondary | ICD-10-CM | POA: Diagnosis not present

## 2017-03-03 DIAGNOSIS — I82401 Acute embolism and thrombosis of unspecified deep veins of right lower extremity: Secondary | ICD-10-CM | POA: Diagnosis not present

## 2017-03-06 DIAGNOSIS — I480 Paroxysmal atrial fibrillation: Secondary | ICD-10-CM | POA: Diagnosis not present

## 2017-03-06 DIAGNOSIS — M549 Dorsalgia, unspecified: Secondary | ICD-10-CM | POA: Diagnosis not present

## 2017-03-06 DIAGNOSIS — I2602 Saddle embolus of pulmonary artery with acute cor pulmonale: Secondary | ICD-10-CM | POA: Diagnosis not present

## 2017-03-06 DIAGNOSIS — D696 Thrombocytopenia, unspecified: Secondary | ICD-10-CM | POA: Diagnosis not present

## 2017-03-06 DIAGNOSIS — I82401 Acute embolism and thrombosis of unspecified deep veins of right lower extremity: Secondary | ICD-10-CM | POA: Diagnosis not present

## 2017-03-06 DIAGNOSIS — I5032 Chronic diastolic (congestive) heart failure: Secondary | ICD-10-CM | POA: Diagnosis not present

## 2017-03-10 DIAGNOSIS — I82401 Acute embolism and thrombosis of unspecified deep veins of right lower extremity: Secondary | ICD-10-CM | POA: Diagnosis not present

## 2017-03-10 DIAGNOSIS — I2602 Saddle embolus of pulmonary artery with acute cor pulmonale: Secondary | ICD-10-CM | POA: Diagnosis not present

## 2017-03-10 DIAGNOSIS — I480 Paroxysmal atrial fibrillation: Secondary | ICD-10-CM | POA: Diagnosis not present

## 2017-03-10 DIAGNOSIS — M549 Dorsalgia, unspecified: Secondary | ICD-10-CM | POA: Diagnosis not present

## 2017-03-10 DIAGNOSIS — I5032 Chronic diastolic (congestive) heart failure: Secondary | ICD-10-CM | POA: Diagnosis not present

## 2017-03-10 DIAGNOSIS — D696 Thrombocytopenia, unspecified: Secondary | ICD-10-CM | POA: Diagnosis not present

## 2017-03-14 ENCOUNTER — Other Ambulatory Visit: Payer: Self-pay

## 2017-03-14 ENCOUNTER — Encounter (HOSPITAL_COMMUNITY): Payer: Self-pay

## 2017-03-14 ENCOUNTER — Emergency Department (HOSPITAL_COMMUNITY)
Admission: EM | Admit: 2017-03-14 | Discharge: 2017-03-14 | Disposition: A | Discharge status: Home / Self Care | Payer: Medicare PPO | Attending: Emergency Medicine | Admitting: Emergency Medicine

## 2017-03-14 DIAGNOSIS — I82401 Acute embolism and thrombosis of unspecified deep veins of right lower extremity: Secondary | ICD-10-CM | POA: Diagnosis not present

## 2017-03-14 DIAGNOSIS — E876 Hypokalemia: Secondary | ICD-10-CM | POA: Diagnosis not present

## 2017-03-14 DIAGNOSIS — R002 Palpitations: Secondary | ICD-10-CM | POA: Diagnosis not present

## 2017-03-14 DIAGNOSIS — I5032 Chronic diastolic (congestive) heart failure: Secondary | ICD-10-CM | POA: Diagnosis not present

## 2017-03-14 DIAGNOSIS — D696 Thrombocytopenia, unspecified: Secondary | ICD-10-CM | POA: Diagnosis not present

## 2017-03-14 DIAGNOSIS — R079 Chest pain, unspecified: Secondary | ICD-10-CM | POA: Diagnosis not present

## 2017-03-14 DIAGNOSIS — Z7901 Long term (current) use of anticoagulants: Secondary | ICD-10-CM | POA: Diagnosis not present

## 2017-03-14 DIAGNOSIS — I2602 Saddle embolus of pulmonary artery with acute cor pulmonale: Secondary | ICD-10-CM | POA: Diagnosis not present

## 2017-03-14 DIAGNOSIS — I480 Paroxysmal atrial fibrillation: Secondary | ICD-10-CM | POA: Diagnosis not present

## 2017-03-14 DIAGNOSIS — I509 Heart failure, unspecified: Secondary | ICD-10-CM | POA: Insufficient documentation

## 2017-03-14 DIAGNOSIS — M549 Dorsalgia, unspecified: Secondary | ICD-10-CM | POA: Diagnosis not present

## 2017-03-14 LAB — BASIC METABOLIC PANEL
ANION GAP: 9 (ref 5–15)
BUN: 26 mg/dL — AB (ref 6–20)
CALCIUM: 9.1 mg/dL (ref 8.9–10.3)
CO2: 31 mmol/L (ref 22–32)
Chloride: 97 mmol/L — ABNORMAL LOW (ref 101–111)
Creatinine, Ser: 0.98 mg/dL (ref 0.44–1.00)
GFR calc Af Amer: >60 mL/min (ref >60)
GFR, EST NON AFRICAN AMERICAN: 53 mL/min — AB (ref >60)
GLUCOSE: 172 mg/dL — AB (ref 65–99)
Potassium: 3 mmol/L — ABNORMAL LOW (ref 3.5–5.1)
Sodium: 137 mmol/L (ref 135–145)

## 2017-03-14 LAB — CBC
HCT: 39.3 % (ref 36.0–46.0)
HEMOGLOBIN: 12.5 g/dL (ref 12.0–15.0)
MCH: 30.8 pg (ref 26.0–34.0)
MCHC: 31.8 g/dL (ref 30.0–36.0)
MCV: 96.8 fL (ref 78.0–100.0)
Platelets: 217 10*3/uL (ref 150–400)
RBC: 4.06 MIL/uL (ref 3.87–5.11)
RDW: 15 % (ref 11.5–15.5)
WBC: 7.4 10*3/uL (ref 4.0–10.5)

## 2017-03-14 LAB — I-STAT TROPONIN, ED: TROPONIN I, POC: 0.02 ng/mL (ref 0.00–0.08)

## 2017-03-14 MED ORDER — POTASSIUM CHLORIDE CRYS ER 20 MEQ PO TBCR
40.0000 meq | EXTENDED_RELEASE_TABLET | Freq: Once | ORAL | Status: AC
  Administered 2017-03-14: 40 meq via ORAL
  Filled 2017-03-14: qty 2

## 2017-03-14 NOTE — ED Provider Notes (Addendum)
[facility] DEPT Provider Note   CSN: [id number] Arrival date & time: 03/14/17  1531     History   Chief Complaint No chief complaint on file. Chief complaint "my heart was fluttering"  HPI Jennifer Medina is a 82 y.o. female.Complains of heart fluttering onset 11 AM today, meaning skipped heartbeats. She is presently asymptomatic. Symptoms have resolved without treatment. Brought by EMS. Denies any chest pain. She does complain of shortness of breath which is unchanged since her discharge from hospital on 01/23/2017 for pulmonary embolism. Breathing is at baseline. She denies noncompliance with medication. Nothing makes symptoms better or worse. No other associated symptoms  HPI  Past Medical History:  Diagnosis Date  . CHF (congestive heart failure) ([place])   . ICH (intracerebral hemorrhage) ([place]) 2009  . Pulmonary embolism ([place]) 01/15/2017   saddle embolism    Patient Active Problem List   Diagnosis Date Noted  . DVT (deep venous thrombosis) ([place]) 02/06/2017  . Chronic anticoagulation 02/06/2017  . Morbid obesity ([place]) 02/06/2017  . SOB (shortness of breath)   . Acute massive pulmonary embolism ([place])   . AKI (acute kidney injury) ([place]) 01/20/2017  . Thrombocytopenia ([place]) 01/20/2017  . Transaminitis 01/20/2017  . Hemoptysis 01/20/2017  . Elevated troponin   . Atrial fibrillation with RVR ([place])   . Wide-complex tachycardia ([place])   . Severe right ventricular systolic dysfunction ([place])   . Saddle embolus of pulmonary artery with acute cor pulmonale (HCC)   . Acute pulmonary embolism ([place]) 01/15/2017  . CHF (congestive heart failure) ([place]) 01/15/2017  . Hyperglycemia 01/15/2017    Past Surgical History:  Procedure Laterality Date  . APPENDECTOMY    . CHOLECYSTECTOMY    . IR GENERIC HISTORICAL  01/19/2017   IR THROMB F/U EVAL ART/VEN FINAL DAY (MS) 01/19/2017 Arne Cleveland, MD [facility]  . IR GENERIC HISTORICAL  01/18/2017   IR ANGIOGRAM PULMONARY BILATERAL  SELECTIVE 01/18/2017 Arne Cleveland, MD [facility]  . IR GENERIC HISTORICAL  01/18/2017   IR ANGIOGRAM SELECTIVE EACH ADDITIONAL VESSEL 01/18/2017 Arne Cleveland, MD [facility]  . IR GENERIC HISTORICAL  01/18/2017   IR INFUSION THROMBOL ARTERIAL INITIAL (MS) 01/18/2017 Arne Cleveland, MD [facility]  . IR GENERIC HISTORICAL  01/18/2017   IR ANGIOGRAM SELECTIVE EACH ADDITIONAL VESSEL 01/18/2017 Arne Cleveland, MD [facility]  . IR GENERIC HISTORICAL  01/18/2017   IR US GUIDE VASC ACCESS RIGHT 01/18/2017 Arne Cleveland, MD [facility]  . IR GENERIC HISTORICAL  01/18/2017   IR INFUSION THROMBOL ARTERIAL INITIAL (MS) 01/18/2017 Arne Cleveland, MD [facility]  . KNEE SURGERY    . rotator cuff surgery      OB History    No data available       Home Medications    Prior to Admission medications   Medication Sig Start Date End Date Taking? Authorizing Provider  acetaminophen (TYLENOL) 325 MG tablet Take 650 mg by mouth every 4 (four) hours as needed for moderate pain or headache.     Historical Provider, MD  atorvastatin (LIPITOR) 40 MG tablet Take 40 mg by mouth at bedtime.    Historical Provider, MD  Camphor (JOINTFLEX EX) Apply 1 application topically as needed (for pain).    Historical Provider, MD  furosemide (LASIX) 40 MG tablet Take 40 mg by mouth daily.    Historical Provider, MD  guaiFENesin (MUCINEX) 600 MG 12 hr tablet Take 600 mg by mouth as directed.    Historical Provider, MD  latanoprost (XALATAN) 0.005 %  ophthalmic solution Place 1 drop into both eyes at bedtime.    Historical Provider, MD  loperamide (IMODIUM A-D) 2 MG tablet Take 2 mg by mouth 4 (four) times daily as needed for diarrhea or loose stools.    Historical Provider, MD  LORazepam (ATIVAN) 0.5 MG tablet Take 1 tablet (0.5 mg total) by mouth every 8 (eight) hours as needed for anxiety. 01/23/17   Shanker Kristeen Mans, MD  metoprolol succinate (TOPROL-XL) 25 MG 24 hr tablet Take 25 mg by mouth daily.    Historical  Provider, MD  Omeprazole 20 MG TBEC Take 20 mg by mouth daily.     Historical Provider, MD  potassium chloride SA (K-DUR,KLOR-CON) 20 MEQ tablet Take 20 mEq by mouth daily.    Historical Provider, MD  rivaroxaban (XARELTO) 20 MG TABS tablet Take 1 tablet (20 mg total) by mouth daily with supper. 02/20/17   Tammy S Parrett, NP  sodium chloride (OCEAN) 0.65 % SOLN nasal spray Place 1 spray into both nostrils as needed for congestion. Please use 10 times a day 01/23/17   Jonetta Osgood, MD  traMADol (ULTRAM) 50 MG tablet Take 1 tablet (50 mg total) by mouth every 6 (six) hours as needed for moderate pain. 01/23/17   Shanker Kristeen Mans, MD    Family History Family History  Problem Relation Age of Onset  . CAD Father     Social History Social History  Substance Use Topics  . Smoking status: Never Smoker  . Smokeless tobacco: Never Used  . Alcohol use No     Allergies   Aspirin; Nsaids; Penicillins; Simvastatin; and Vancomycin   Review of Systems Review of Systems  Constitutional: Negative.   HENT: Negative.   Respiratory: Positive for shortness of breath.        Chronic dyspnea  Cardiovascular: Positive for palpitations.       Chronic leg edema. Swelling has been improved since treated with what there is an Lasix  Gastrointestinal: Negative.   Musculoskeletal: Negative.   Skin: Negative.   Neurological: Negative.   Psychiatric/Behavioral: Negative.   All other systems reviewed and are negative.    Physical Exam Updated Vital Signs Ht 5\' 4"  (1.626 m)   Wt 224 lb (101.6 kg)   BMI 38.45 kg/m   Physical Exam  Constitutional: She appears well-developed and well-nourished. No distress.  HENT:  Head: Normocephalic and atraumatic.  Eyes: Conjunctivae are normal. Pupils are equal, round, and reactive to light.  Neck: Neck supple. No tracheal deviation present. No thyromegaly present.  Cardiovascular: Normal rate, regular rhythm and normal heart sounds.   No murmur  heard. Pulmonary/Chest: Effort normal and breath sounds normal.  Abdominal: Soft. Bowel sounds are normal. She exhibits no distension. There is no tenderness.  Obese  Musculoskeletal: Normal range of motion. She exhibits no edema or tenderness.  Neurological: She is alert. Coordination normal.  Skin: Skin is warm and dry. No rash noted.  Psychiatric: She has a normal mood and affect.  Nursing note and vitals reviewed.    ED Treatments / Results  Labs (all labs ordered are listed, but only abnormal results are displayed) Labs Reviewed  BASIC METABOLIC PANEL  CBC  I-STAT [place], ED    EKG  EKG Interpretation  Date/Time:  Friday Mar 14 2017 14:50:20 EDT Ventricular Rate:  64 PR Interval:  134 QRS Duration: 90 QT Interval:  392 QTC Calculation: 404 R Axis:   -31 Text Interpretation:  Normal sinus rhythm Left axis deviation Nonspecific  ST and T wave abnormality Abnormal ECG No significant change since last tracing Confirmed by Winfred Leeds  MD, Ilianna Bown [phone]) on 03/14/2017 7:17:41 PM       Radiology No results found.  Procedures Procedures (including critical care time)  Medications Ordered in ED Medications - No data to display   Initial Impression / Assessment and Plan / ED Course  I have reviewed the triage vital signs and the nursing notes.  Pertinent labs & imaging results that were available during my care of the patient were reviewed by me and considered in my medical decision making (see chart for details).     Patient remained in sinus rhythm during her entire emergency department stay and remained asymptomatic. She received potassium chloride 40 mEq by mouth prior to discharge. She is instructed to follow-up with her primary care physician to get serum potassium level rechecked next week. She does take potassium chloride 20 mEq daily.  Final Clinical Impressions(s) / ED Diagnoses  Dx #1 palpitations #2 hypokalemia Final diagnoses:  None    New  Prescriptions New Prescriptions   No medications on file     Orlie Dakin, MD 03/14/17 [place], MD 03/15/17 [id number]

## 2017-03-14 NOTE — Discharge Instructions (Signed)
Your blood potassium today was slightly low at 3.0. You were given an extra dose of potassium 40 mEq while here. Continue to take your potassium supplement at home. Call Dr Denton Lank office on Monday, 03/17/2017 to arrange to get your blood potassium level rechecked next week. Return if your condition worsens for any reason

## 2017-03-14 NOTE — ED Triage Notes (Signed)
Per [facility], pt from home with recent dx of DVT in left leg x 1 week ago and is taking xarelto, today began to have intermittent palpitations, pt called cards and was told to come here. Denies shob or pain. Pt has edema in both feet and is taking lasix. Pt alert and oriented x 4. VS 122/84, HR 72, 16 RR, 97% on RA, cbg 313.

## 2017-03-17 DIAGNOSIS — I480 Paroxysmal atrial fibrillation: Secondary | ICD-10-CM | POA: Diagnosis not present

## 2017-03-17 DIAGNOSIS — I2602 Saddle embolus of pulmonary artery with acute cor pulmonale: Secondary | ICD-10-CM | POA: Diagnosis not present

## 2017-03-17 DIAGNOSIS — I5032 Chronic diastolic (congestive) heart failure: Secondary | ICD-10-CM | POA: Diagnosis not present

## 2017-03-17 DIAGNOSIS — M549 Dorsalgia, unspecified: Secondary | ICD-10-CM | POA: Diagnosis not present

## 2017-03-17 DIAGNOSIS — D696 Thrombocytopenia, unspecified: Secondary | ICD-10-CM | POA: Diagnosis not present

## 2017-03-17 DIAGNOSIS — I82401 Acute embolism and thrombosis of unspecified deep veins of right lower extremity: Secondary | ICD-10-CM | POA: Diagnosis not present

## 2017-03-18 DIAGNOSIS — I5032 Chronic diastolic (congestive) heart failure: Secondary | ICD-10-CM | POA: Diagnosis not present

## 2017-03-18 DIAGNOSIS — D696 Thrombocytopenia, unspecified: Secondary | ICD-10-CM | POA: Diagnosis not present

## 2017-03-18 DIAGNOSIS — M549 Dorsalgia, unspecified: Secondary | ICD-10-CM | POA: Diagnosis not present

## 2017-03-18 DIAGNOSIS — I2602 Saddle embolus of pulmonary artery with acute cor pulmonale: Secondary | ICD-10-CM | POA: Diagnosis not present

## 2017-03-18 DIAGNOSIS — I480 Paroxysmal atrial fibrillation: Secondary | ICD-10-CM | POA: Diagnosis not present

## 2017-03-18 DIAGNOSIS — I82401 Acute embolism and thrombosis of unspecified deep veins of right lower extremity: Secondary | ICD-10-CM | POA: Diagnosis not present

## 2017-03-20 DIAGNOSIS — I480 Paroxysmal atrial fibrillation: Secondary | ICD-10-CM | POA: Diagnosis not present

## 2017-03-20 DIAGNOSIS — I82401 Acute embolism and thrombosis of unspecified deep veins of right lower extremity: Secondary | ICD-10-CM | POA: Diagnosis not present

## 2017-03-20 DIAGNOSIS — D696 Thrombocytopenia, unspecified: Secondary | ICD-10-CM | POA: Diagnosis not present

## 2017-03-20 DIAGNOSIS — M549 Dorsalgia, unspecified: Secondary | ICD-10-CM | POA: Diagnosis not present

## 2017-03-20 DIAGNOSIS — I2602 Saddle embolus of pulmonary artery with acute cor pulmonale: Secondary | ICD-10-CM | POA: Diagnosis not present

## 2017-03-20 DIAGNOSIS — I5032 Chronic diastolic (congestive) heart failure: Secondary | ICD-10-CM | POA: Diagnosis not present

## 2017-03-21 DIAGNOSIS — D696 Thrombocytopenia, unspecified: Secondary | ICD-10-CM | POA: Diagnosis not present

## 2017-03-21 DIAGNOSIS — I2602 Saddle embolus of pulmonary artery with acute cor pulmonale: Secondary | ICD-10-CM | POA: Diagnosis not present

## 2017-03-21 DIAGNOSIS — M549 Dorsalgia, unspecified: Secondary | ICD-10-CM | POA: Diagnosis not present

## 2017-03-21 DIAGNOSIS — I480 Paroxysmal atrial fibrillation: Secondary | ICD-10-CM | POA: Diagnosis not present

## 2017-03-21 DIAGNOSIS — I82401 Acute embolism and thrombosis of unspecified deep veins of right lower extremity: Secondary | ICD-10-CM | POA: Diagnosis not present

## 2017-03-21 DIAGNOSIS — I5032 Chronic diastolic (congestive) heart failure: Secondary | ICD-10-CM | POA: Diagnosis not present

## 2017-03-24 DIAGNOSIS — I82401 Acute embolism and thrombosis of unspecified deep veins of right lower extremity: Secondary | ICD-10-CM | POA: Diagnosis not present

## 2017-03-24 DIAGNOSIS — D696 Thrombocytopenia, unspecified: Secondary | ICD-10-CM | POA: Diagnosis not present

## 2017-03-24 DIAGNOSIS — I2602 Saddle embolus of pulmonary artery with acute cor pulmonale: Secondary | ICD-10-CM | POA: Diagnosis not present

## 2017-03-24 DIAGNOSIS — M549 Dorsalgia, unspecified: Secondary | ICD-10-CM | POA: Diagnosis not present

## 2017-03-24 DIAGNOSIS — I480 Paroxysmal atrial fibrillation: Secondary | ICD-10-CM | POA: Diagnosis not present

## 2017-03-24 DIAGNOSIS — I5032 Chronic diastolic (congestive) heart failure: Secondary | ICD-10-CM | POA: Diagnosis not present

## 2017-03-26 DIAGNOSIS — I82401 Acute embolism and thrombosis of unspecified deep veins of right lower extremity: Secondary | ICD-10-CM | POA: Diagnosis not present

## 2017-03-26 DIAGNOSIS — I5032 Chronic diastolic (congestive) heart failure: Secondary | ICD-10-CM | POA: Diagnosis not present

## 2017-03-26 DIAGNOSIS — I2602 Saddle embolus of pulmonary artery with acute cor pulmonale: Secondary | ICD-10-CM | POA: Diagnosis not present

## 2017-03-26 DIAGNOSIS — M549 Dorsalgia, unspecified: Secondary | ICD-10-CM | POA: Diagnosis not present

## 2017-03-26 DIAGNOSIS — D696 Thrombocytopenia, unspecified: Secondary | ICD-10-CM | POA: Diagnosis not present

## 2017-03-26 DIAGNOSIS — I480 Paroxysmal atrial fibrillation: Secondary | ICD-10-CM | POA: Diagnosis not present

## 2017-03-27 DIAGNOSIS — I480 Paroxysmal atrial fibrillation: Secondary | ICD-10-CM | POA: Diagnosis not present

## 2017-03-27 DIAGNOSIS — D696 Thrombocytopenia, unspecified: Secondary | ICD-10-CM | POA: Diagnosis not present

## 2017-03-27 DIAGNOSIS — I82401 Acute embolism and thrombosis of unspecified deep veins of right lower extremity: Secondary | ICD-10-CM | POA: Diagnosis not present

## 2017-03-27 DIAGNOSIS — M549 Dorsalgia, unspecified: Secondary | ICD-10-CM | POA: Diagnosis not present

## 2017-03-27 DIAGNOSIS — I5032 Chronic diastolic (congestive) heart failure: Secondary | ICD-10-CM | POA: Diagnosis not present

## 2017-03-27 DIAGNOSIS — I2602 Saddle embolus of pulmonary artery with acute cor pulmonale: Secondary | ICD-10-CM | POA: Diagnosis not present

## 2017-04-02 DIAGNOSIS — I2602 Saddle embolus of pulmonary artery with acute cor pulmonale: Secondary | ICD-10-CM | POA: Diagnosis not present

## 2017-04-02 DIAGNOSIS — D696 Thrombocytopenia, unspecified: Secondary | ICD-10-CM | POA: Diagnosis not present

## 2017-04-02 DIAGNOSIS — I480 Paroxysmal atrial fibrillation: Secondary | ICD-10-CM | POA: Diagnosis not present

## 2017-04-02 DIAGNOSIS — M549 Dorsalgia, unspecified: Secondary | ICD-10-CM | POA: Diagnosis not present

## 2017-04-02 DIAGNOSIS — I82401 Acute embolism and thrombosis of unspecified deep veins of right lower extremity: Secondary | ICD-10-CM | POA: Diagnosis not present

## 2017-04-02 DIAGNOSIS — I5032 Chronic diastolic (congestive) heart failure: Secondary | ICD-10-CM | POA: Diagnosis not present

## 2017-04-03 DIAGNOSIS — I10 Essential (primary) hypertension: Secondary | ICD-10-CM | POA: Diagnosis not present

## 2017-04-03 DIAGNOSIS — E875 Hyperkalemia: Secondary | ICD-10-CM | POA: Diagnosis not present

## 2017-04-03 DIAGNOSIS — Z8719 Personal history of other diseases of the digestive system: Secondary | ICD-10-CM | POA: Diagnosis not present

## 2017-04-03 DIAGNOSIS — Z86718 Personal history of other venous thrombosis and embolism: Secondary | ICD-10-CM | POA: Diagnosis not present

## 2017-04-03 DIAGNOSIS — Z8679 Personal history of other diseases of the circulatory system: Secondary | ICD-10-CM | POA: Diagnosis not present

## 2017-04-03 DIAGNOSIS — K5790 Diverticulosis of intestine, part unspecified, without perforation or abscess without bleeding: Secondary | ICD-10-CM | POA: Diagnosis not present

## 2017-04-03 DIAGNOSIS — Z86711 Personal history of pulmonary embolism: Secondary | ICD-10-CM | POA: Diagnosis not present

## 2017-04-03 DIAGNOSIS — K922 Gastrointestinal hemorrhage, unspecified: Secondary | ICD-10-CM | POA: Diagnosis not present

## 2017-04-04 DIAGNOSIS — D62 Acute posthemorrhagic anemia: Secondary | ICD-10-CM | POA: Diagnosis not present

## 2017-04-04 DIAGNOSIS — Z86711 Personal history of pulmonary embolism: Secondary | ICD-10-CM | POA: Diagnosis not present

## 2017-04-04 DIAGNOSIS — Z8679 Personal history of other diseases of the circulatory system: Secondary | ICD-10-CM | POA: Diagnosis not present

## 2017-04-04 DIAGNOSIS — K922 Gastrointestinal hemorrhage, unspecified: Secondary | ICD-10-CM | POA: Diagnosis not present

## 2017-04-04 DIAGNOSIS — Z86718 Personal history of other venous thrombosis and embolism: Secondary | ICD-10-CM | POA: Diagnosis not present

## 2017-04-04 DIAGNOSIS — K921 Melena: Secondary | ICD-10-CM | POA: Diagnosis not present

## 2017-04-04 DIAGNOSIS — I10 Essential (primary) hypertension: Secondary | ICD-10-CM | POA: Diagnosis not present

## 2017-04-04 DIAGNOSIS — K5790 Diverticulosis of intestine, part unspecified, without perforation or abscess without bleeding: Secondary | ICD-10-CM | POA: Diagnosis not present

## 2017-04-04 DIAGNOSIS — Z8719 Personal history of other diseases of the digestive system: Secondary | ICD-10-CM | POA: Diagnosis not present

## 2017-04-04 DIAGNOSIS — K5730 Diverticulosis of large intestine without perforation or abscess without bleeding: Secondary | ICD-10-CM | POA: Diagnosis not present

## 2017-04-04 DIAGNOSIS — E875 Hyperkalemia: Secondary | ICD-10-CM | POA: Diagnosis not present

## 2017-04-04 DIAGNOSIS — R195 Other fecal abnormalities: Secondary | ICD-10-CM | POA: Diagnosis not present

## 2017-04-04 DIAGNOSIS — D120 Benign neoplasm of cecum: Secondary | ICD-10-CM | POA: Diagnosis not present

## 2017-04-05 DIAGNOSIS — D120 Benign neoplasm of cecum: Secondary | ICD-10-CM | POA: Diagnosis not present

## 2017-04-05 DIAGNOSIS — Z86711 Personal history of pulmonary embolism: Secondary | ICD-10-CM | POA: Diagnosis not present

## 2017-04-05 DIAGNOSIS — I10 Essential (primary) hypertension: Secondary | ICD-10-CM | POA: Diagnosis not present

## 2017-04-05 DIAGNOSIS — E875 Hyperkalemia: Secondary | ICD-10-CM | POA: Diagnosis not present

## 2017-04-05 DIAGNOSIS — K5730 Diverticulosis of large intestine without perforation or abscess without bleeding: Secondary | ICD-10-CM | POA: Diagnosis not present

## 2017-04-05 DIAGNOSIS — Z8679 Personal history of other diseases of the circulatory system: Secondary | ICD-10-CM | POA: Diagnosis not present

## 2017-04-05 DIAGNOSIS — K5790 Diverticulosis of intestine, part unspecified, without perforation or abscess without bleeding: Secondary | ICD-10-CM | POA: Diagnosis not present

## 2017-04-05 DIAGNOSIS — R195 Other fecal abnormalities: Secondary | ICD-10-CM | POA: Diagnosis not present

## 2017-04-05 DIAGNOSIS — D62 Acute posthemorrhagic anemia: Secondary | ICD-10-CM | POA: Diagnosis not present

## 2017-04-05 DIAGNOSIS — Z86718 Personal history of other venous thrombosis and embolism: Secondary | ICD-10-CM | POA: Diagnosis not present

## 2017-04-05 DIAGNOSIS — K921 Melena: Secondary | ICD-10-CM | POA: Diagnosis not present

## 2017-04-05 DIAGNOSIS — Z8719 Personal history of other diseases of the digestive system: Secondary | ICD-10-CM | POA: Diagnosis not present

## 2017-04-05 DIAGNOSIS — K922 Gastrointestinal hemorrhage, unspecified: Secondary | ICD-10-CM | POA: Diagnosis not present

## 2017-04-06 DIAGNOSIS — K5730 Diverticulosis of large intestine without perforation or abscess without bleeding: Secondary | ICD-10-CM | POA: Diagnosis not present

## 2017-04-06 DIAGNOSIS — K5790 Diverticulosis of intestine, part unspecified, without perforation or abscess without bleeding: Secondary | ICD-10-CM | POA: Diagnosis not present

## 2017-04-06 DIAGNOSIS — Z86718 Personal history of other venous thrombosis and embolism: Secondary | ICD-10-CM | POA: Diagnosis not present

## 2017-04-06 DIAGNOSIS — D62 Acute posthemorrhagic anemia: Secondary | ICD-10-CM | POA: Diagnosis not present

## 2017-04-06 DIAGNOSIS — K922 Gastrointestinal hemorrhage, unspecified: Secondary | ICD-10-CM | POA: Diagnosis not present

## 2017-04-06 DIAGNOSIS — K921 Melena: Secondary | ICD-10-CM | POA: Diagnosis not present

## 2017-04-06 DIAGNOSIS — E875 Hyperkalemia: Secondary | ICD-10-CM | POA: Diagnosis not present

## 2017-04-06 DIAGNOSIS — K635 Polyp of colon: Secondary | ICD-10-CM | POA: Diagnosis not present

## 2017-04-06 DIAGNOSIS — I10 Essential (primary) hypertension: Secondary | ICD-10-CM | POA: Diagnosis not present

## 2017-04-06 DIAGNOSIS — R195 Other fecal abnormalities: Secondary | ICD-10-CM | POA: Diagnosis not present

## 2017-04-06 DIAGNOSIS — Z8719 Personal history of other diseases of the digestive system: Secondary | ICD-10-CM | POA: Diagnosis not present

## 2017-04-06 DIAGNOSIS — Z8679 Personal history of other diseases of the circulatory system: Secondary | ICD-10-CM | POA: Diagnosis not present

## 2017-04-06 DIAGNOSIS — Z86711 Personal history of pulmonary embolism: Secondary | ICD-10-CM | POA: Diagnosis not present

## 2017-04-06 DIAGNOSIS — D120 Benign neoplasm of cecum: Secondary | ICD-10-CM | POA: Diagnosis not present

## 2017-04-10 DIAGNOSIS — I5032 Chronic diastolic (congestive) heart failure: Secondary | ICD-10-CM | POA: Diagnosis not present

## 2017-04-10 DIAGNOSIS — M549 Dorsalgia, unspecified: Secondary | ICD-10-CM | POA: Diagnosis not present

## 2017-04-10 DIAGNOSIS — I480 Paroxysmal atrial fibrillation: Secondary | ICD-10-CM | POA: Diagnosis not present

## 2017-04-10 DIAGNOSIS — D696 Thrombocytopenia, unspecified: Secondary | ICD-10-CM | POA: Diagnosis not present

## 2017-04-10 DIAGNOSIS — I2602 Saddle embolus of pulmonary artery with acute cor pulmonale: Secondary | ICD-10-CM | POA: Diagnosis not present

## 2017-04-10 DIAGNOSIS — I82401 Acute embolism and thrombosis of unspecified deep veins of right lower extremity: Secondary | ICD-10-CM | POA: Diagnosis not present

## 2017-04-11 DIAGNOSIS — I82401 Acute embolism and thrombosis of unspecified deep veins of right lower extremity: Secondary | ICD-10-CM | POA: Diagnosis not present

## 2017-04-11 DIAGNOSIS — M549 Dorsalgia, unspecified: Secondary | ICD-10-CM | POA: Diagnosis not present

## 2017-04-11 DIAGNOSIS — I5032 Chronic diastolic (congestive) heart failure: Secondary | ICD-10-CM | POA: Diagnosis not present

## 2017-04-11 DIAGNOSIS — I480 Paroxysmal atrial fibrillation: Secondary | ICD-10-CM | POA: Diagnosis not present

## 2017-04-11 DIAGNOSIS — D696 Thrombocytopenia, unspecified: Secondary | ICD-10-CM | POA: Diagnosis not present

## 2017-04-11 DIAGNOSIS — I2602 Saddle embolus of pulmonary artery with acute cor pulmonale: Secondary | ICD-10-CM | POA: Diagnosis not present

## 2017-04-14 DIAGNOSIS — I2699 Other pulmonary embolism without acute cor pulmonale: Secondary | ICD-10-CM | POA: Diagnosis not present

## 2017-04-14 DIAGNOSIS — K922 Gastrointestinal hemorrhage, unspecified: Secondary | ICD-10-CM | POA: Diagnosis not present

## 2017-04-14 DIAGNOSIS — K5790 Diverticulosis of intestine, part unspecified, without perforation or abscess without bleeding: Secondary | ICD-10-CM | POA: Diagnosis not present

## 2017-04-14 DIAGNOSIS — I5032 Chronic diastolic (congestive) heart failure: Secondary | ICD-10-CM | POA: Diagnosis not present

## 2017-04-14 DIAGNOSIS — Z79899 Other long term (current) drug therapy: Secondary | ICD-10-CM | POA: Diagnosis not present

## 2017-04-14 DIAGNOSIS — I82401 Acute embolism and thrombosis of unspecified deep veins of right lower extremity: Secondary | ICD-10-CM | POA: Diagnosis not present

## 2017-04-15 DIAGNOSIS — I2602 Saddle embolus of pulmonary artery with acute cor pulmonale: Secondary | ICD-10-CM | POA: Diagnosis not present

## 2017-04-15 DIAGNOSIS — I82401 Acute embolism and thrombosis of unspecified deep veins of right lower extremity: Secondary | ICD-10-CM | POA: Diagnosis not present

## 2017-04-15 DIAGNOSIS — I480 Paroxysmal atrial fibrillation: Secondary | ICD-10-CM | POA: Diagnosis not present

## 2017-04-15 DIAGNOSIS — D696 Thrombocytopenia, unspecified: Secondary | ICD-10-CM | POA: Diagnosis not present

## 2017-04-15 DIAGNOSIS — M549 Dorsalgia, unspecified: Secondary | ICD-10-CM | POA: Diagnosis not present

## 2017-04-15 DIAGNOSIS — I5032 Chronic diastolic (congestive) heart failure: Secondary | ICD-10-CM | POA: Diagnosis not present

## 2017-04-17 DIAGNOSIS — I2602 Saddle embolus of pulmonary artery with acute cor pulmonale: Secondary | ICD-10-CM | POA: Diagnosis not present

## 2017-04-17 DIAGNOSIS — M549 Dorsalgia, unspecified: Secondary | ICD-10-CM | POA: Diagnosis not present

## 2017-04-17 DIAGNOSIS — D696 Thrombocytopenia, unspecified: Secondary | ICD-10-CM | POA: Diagnosis not present

## 2017-04-17 DIAGNOSIS — I5032 Chronic diastolic (congestive) heart failure: Secondary | ICD-10-CM | POA: Diagnosis not present

## 2017-04-17 DIAGNOSIS — I82401 Acute embolism and thrombosis of unspecified deep veins of right lower extremity: Secondary | ICD-10-CM | POA: Diagnosis not present

## 2017-04-17 DIAGNOSIS — I480 Paroxysmal atrial fibrillation: Secondary | ICD-10-CM | POA: Diagnosis not present

## 2017-04-18 DIAGNOSIS — M549 Dorsalgia, unspecified: Secondary | ICD-10-CM | POA: Diagnosis not present

## 2017-04-18 DIAGNOSIS — I82401 Acute embolism and thrombosis of unspecified deep veins of right lower extremity: Secondary | ICD-10-CM | POA: Diagnosis not present

## 2017-04-18 DIAGNOSIS — D696 Thrombocytopenia, unspecified: Secondary | ICD-10-CM | POA: Diagnosis not present

## 2017-04-18 DIAGNOSIS — I480 Paroxysmal atrial fibrillation: Secondary | ICD-10-CM | POA: Diagnosis not present

## 2017-04-18 DIAGNOSIS — I5032 Chronic diastolic (congestive) heart failure: Secondary | ICD-10-CM | POA: Diagnosis not present

## 2017-04-18 DIAGNOSIS — I2602 Saddle embolus of pulmonary artery with acute cor pulmonale: Secondary | ICD-10-CM | POA: Diagnosis not present

## 2017-04-21 DIAGNOSIS — I480 Paroxysmal atrial fibrillation: Secondary | ICD-10-CM | POA: Diagnosis not present

## 2017-04-21 DIAGNOSIS — D696 Thrombocytopenia, unspecified: Secondary | ICD-10-CM | POA: Diagnosis not present

## 2017-04-21 DIAGNOSIS — I82401 Acute embolism and thrombosis of unspecified deep veins of right lower extremity: Secondary | ICD-10-CM | POA: Diagnosis not present

## 2017-04-21 DIAGNOSIS — I2602 Saddle embolus of pulmonary artery with acute cor pulmonale: Secondary | ICD-10-CM | POA: Diagnosis not present

## 2017-04-21 DIAGNOSIS — M549 Dorsalgia, unspecified: Secondary | ICD-10-CM | POA: Diagnosis not present

## 2017-04-21 DIAGNOSIS — I5032 Chronic diastolic (congestive) heart failure: Secondary | ICD-10-CM | POA: Diagnosis not present

## 2017-04-27 DIAGNOSIS — I82401 Acute embolism and thrombosis of unspecified deep veins of right lower extremity: Secondary | ICD-10-CM | POA: Diagnosis not present

## 2017-04-27 DIAGNOSIS — I480 Paroxysmal atrial fibrillation: Secondary | ICD-10-CM | POA: Diagnosis not present

## 2017-04-27 DIAGNOSIS — I5032 Chronic diastolic (congestive) heart failure: Secondary | ICD-10-CM | POA: Diagnosis not present

## 2017-04-27 DIAGNOSIS — I2602 Saddle embolus of pulmonary artery with acute cor pulmonale: Secondary | ICD-10-CM | POA: Diagnosis not present

## 2017-05-01 DIAGNOSIS — Z7901 Long term (current) use of anticoagulants: Secondary | ICD-10-CM | POA: Diagnosis not present

## 2017-05-01 DIAGNOSIS — E785 Hyperlipidemia, unspecified: Secondary | ICD-10-CM | POA: Diagnosis not present

## 2017-05-01 DIAGNOSIS — I361 Nonrheumatic tricuspid (valve) insufficiency: Secondary | ICD-10-CM | POA: Diagnosis not present

## 2017-05-01 DIAGNOSIS — I5030 Unspecified diastolic (congestive) heart failure: Secondary | ICD-10-CM | POA: Diagnosis not present

## 2017-05-01 DIAGNOSIS — I480 Paroxysmal atrial fibrillation: Secondary | ICD-10-CM | POA: Diagnosis not present

## 2017-05-01 DIAGNOSIS — I82403 Acute embolism and thrombosis of unspecified deep veins of lower extremity, bilateral: Secondary | ICD-10-CM | POA: Diagnosis not present

## 2017-05-08 DIAGNOSIS — I2602 Saddle embolus of pulmonary artery with acute cor pulmonale: Secondary | ICD-10-CM | POA: Diagnosis not present

## 2017-05-08 DIAGNOSIS — I82401 Acute embolism and thrombosis of unspecified deep veins of right lower extremity: Secondary | ICD-10-CM | POA: Diagnosis not present

## 2017-05-20 ENCOUNTER — Ambulatory Visit: Payer: Medicare PPO | Admitting: Internal Medicine

## 2017-05-27 DIAGNOSIS — I5032 Chronic diastolic (congestive) heart failure: Secondary | ICD-10-CM | POA: Diagnosis not present

## 2017-05-27 DIAGNOSIS — I2602 Saddle embolus of pulmonary artery with acute cor pulmonale: Secondary | ICD-10-CM | POA: Diagnosis not present

## 2017-05-27 DIAGNOSIS — I480 Paroxysmal atrial fibrillation: Secondary | ICD-10-CM | POA: Diagnosis not present

## 2017-05-27 DIAGNOSIS — I82401 Acute embolism and thrombosis of unspecified deep veins of right lower extremity: Secondary | ICD-10-CM | POA: Diagnosis not present

## 2017-06-27 DIAGNOSIS — I5032 Chronic diastolic (congestive) heart failure: Secondary | ICD-10-CM | POA: Diagnosis not present

## 2017-06-27 DIAGNOSIS — I480 Paroxysmal atrial fibrillation: Secondary | ICD-10-CM | POA: Diagnosis not present

## 2017-06-27 DIAGNOSIS — I2602 Saddle embolus of pulmonary artery with acute cor pulmonale: Secondary | ICD-10-CM | POA: Diagnosis not present

## 2017-06-27 DIAGNOSIS — I82401 Acute embolism and thrombosis of unspecified deep veins of right lower extremity: Secondary | ICD-10-CM | POA: Diagnosis not present

## 2017-07-01 DIAGNOSIS — E1165 Type 2 diabetes mellitus with hyperglycemia: Secondary | ICD-10-CM | POA: Diagnosis not present

## 2017-07-01 DIAGNOSIS — Z6841 Body Mass Index (BMI) 40.0 and over, adult: Secondary | ICD-10-CM | POA: Diagnosis not present

## 2017-07-01 DIAGNOSIS — I5032 Chronic diastolic (congestive) heart failure: Secondary | ICD-10-CM | POA: Diagnosis not present

## 2017-07-01 DIAGNOSIS — Z23 Encounter for immunization: Secondary | ICD-10-CM | POA: Diagnosis not present

## 2017-07-01 DIAGNOSIS — Z79899 Other long term (current) drug therapy: Secondary | ICD-10-CM | POA: Diagnosis not present

## 2017-07-01 DIAGNOSIS — E785 Hyperlipidemia, unspecified: Secondary | ICD-10-CM | POA: Diagnosis not present

## 2017-07-01 DIAGNOSIS — I2699 Other pulmonary embolism without acute cor pulmonale: Secondary | ICD-10-CM | POA: Diagnosis not present

## 2017-07-01 DIAGNOSIS — I82401 Acute embolism and thrombosis of unspecified deep veins of right lower extremity: Secondary | ICD-10-CM | POA: Diagnosis not present

## 2017-07-01 DIAGNOSIS — I10 Essential (primary) hypertension: Secondary | ICD-10-CM | POA: Diagnosis not present

## 2017-07-03 DIAGNOSIS — E1165 Type 2 diabetes mellitus with hyperglycemia: Secondary | ICD-10-CM | POA: Diagnosis not present

## 2017-07-08 DIAGNOSIS — I82401 Acute embolism and thrombosis of unspecified deep veins of right lower extremity: Secondary | ICD-10-CM | POA: Diagnosis not present

## 2017-07-08 DIAGNOSIS — R600 Localized edema: Secondary | ICD-10-CM | POA: Diagnosis not present

## 2017-07-25 DIAGNOSIS — I110 Hypertensive heart disease with heart failure: Secondary | ICD-10-CM | POA: Diagnosis not present

## 2017-07-25 DIAGNOSIS — R1084 Generalized abdominal pain: Secondary | ICD-10-CM | POA: Diagnosis not present

## 2017-07-25 DIAGNOSIS — Z86711 Personal history of pulmonary embolism: Secondary | ICD-10-CM | POA: Diagnosis not present

## 2017-07-25 DIAGNOSIS — R079 Chest pain, unspecified: Secondary | ICD-10-CM | POA: Diagnosis not present

## 2017-07-25 DIAGNOSIS — R51 Headache: Secondary | ICD-10-CM | POA: Diagnosis not present

## 2017-07-25 DIAGNOSIS — L03115 Cellulitis of right lower limb: Secondary | ICD-10-CM | POA: Diagnosis not present

## 2017-07-25 DIAGNOSIS — J9600 Acute respiratory failure, unspecified whether with hypoxia or hypercapnia: Secondary | ICD-10-CM | POA: Diagnosis not present

## 2017-07-25 DIAGNOSIS — Z881 Allergy status to other antibiotic agents status: Secondary | ICD-10-CM | POA: Diagnosis not present

## 2017-07-25 DIAGNOSIS — R0602 Shortness of breath: Secondary | ICD-10-CM | POA: Diagnosis not present

## 2017-07-25 DIAGNOSIS — I509 Heart failure, unspecified: Secondary | ICD-10-CM | POA: Diagnosis not present

## 2017-07-25 DIAGNOSIS — I5033 Acute on chronic diastolic (congestive) heart failure: Secondary | ICD-10-CM | POA: Diagnosis not present

## 2017-07-25 DIAGNOSIS — Z86718 Personal history of other venous thrombosis and embolism: Secondary | ICD-10-CM | POA: Diagnosis not present

## 2017-07-26 DIAGNOSIS — I10 Essential (primary) hypertension: Secondary | ICD-10-CM | POA: Diagnosis not present

## 2017-07-26 DIAGNOSIS — R51 Headache: Secondary | ICD-10-CM | POA: Diagnosis not present

## 2017-07-26 DIAGNOSIS — R0602 Shortness of breath: Secondary | ICD-10-CM | POA: Diagnosis not present

## 2017-07-26 DIAGNOSIS — R079 Chest pain, unspecified: Secondary | ICD-10-CM | POA: Diagnosis not present

## 2017-07-26 DIAGNOSIS — I509 Heart failure, unspecified: Secondary | ICD-10-CM | POA: Diagnosis not present

## 2017-07-26 LAB — PROTIME-INR

## 2017-07-27 DIAGNOSIS — I10 Essential (primary) hypertension: Secondary | ICD-10-CM | POA: Diagnosis not present

## 2017-07-27 DIAGNOSIS — I509 Heart failure, unspecified: Secondary | ICD-10-CM | POA: Diagnosis not present

## 2017-07-27 DIAGNOSIS — R079 Chest pain, unspecified: Secondary | ICD-10-CM | POA: Diagnosis not present

## 2017-07-28 DIAGNOSIS — R079 Chest pain, unspecified: Secondary | ICD-10-CM | POA: Diagnosis not present

## 2017-07-28 DIAGNOSIS — I82401 Acute embolism and thrombosis of unspecified deep veins of right lower extremity: Secondary | ICD-10-CM | POA: Diagnosis not present

## 2017-07-28 DIAGNOSIS — I2602 Saddle embolus of pulmonary artery with acute cor pulmonale: Secondary | ICD-10-CM | POA: Diagnosis not present

## 2017-07-28 DIAGNOSIS — I480 Paroxysmal atrial fibrillation: Secondary | ICD-10-CM | POA: Diagnosis not present

## 2017-07-28 DIAGNOSIS — I5032 Chronic diastolic (congestive) heart failure: Secondary | ICD-10-CM | POA: Diagnosis not present

## 2017-07-28 DIAGNOSIS — I10 Essential (primary) hypertension: Secondary | ICD-10-CM | POA: Diagnosis not present

## 2017-07-28 DIAGNOSIS — I509 Heart failure, unspecified: Secondary | ICD-10-CM | POA: Diagnosis not present

## 2017-07-29 DIAGNOSIS — I110 Hypertensive heart disease with heart failure: Secondary | ICD-10-CM | POA: Diagnosis not present

## 2017-07-29 DIAGNOSIS — Z9181 History of falling: Secondary | ICD-10-CM | POA: Diagnosis not present

## 2017-07-29 DIAGNOSIS — K219 Gastro-esophageal reflux disease without esophagitis: Secondary | ICD-10-CM | POA: Diagnosis not present

## 2017-07-29 DIAGNOSIS — Z86711 Personal history of pulmonary embolism: Secondary | ICD-10-CM | POA: Diagnosis not present

## 2017-07-29 DIAGNOSIS — I509 Heart failure, unspecified: Secondary | ICD-10-CM | POA: Diagnosis not present

## 2017-07-29 DIAGNOSIS — Z6839 Body mass index (BMI) 39.0-39.9, adult: Secondary | ICD-10-CM | POA: Diagnosis not present

## 2017-07-29 DIAGNOSIS — Z7901 Long term (current) use of anticoagulants: Secondary | ICD-10-CM | POA: Diagnosis not present

## 2017-07-29 DIAGNOSIS — E7800 Pure hypercholesterolemia, unspecified: Secondary | ICD-10-CM | POA: Diagnosis not present

## 2017-08-01 DIAGNOSIS — Z6841 Body Mass Index (BMI) 40.0 and over, adult: Secondary | ICD-10-CM | POA: Diagnosis not present

## 2017-08-01 DIAGNOSIS — Z79899 Other long term (current) drug therapy: Secondary | ICD-10-CM | POA: Diagnosis not present

## 2017-08-01 DIAGNOSIS — L03115 Cellulitis of right lower limb: Secondary | ICD-10-CM | POA: Diagnosis not present

## 2017-08-01 DIAGNOSIS — I5033 Acute on chronic diastolic (congestive) heart failure: Secondary | ICD-10-CM | POA: Diagnosis not present

## 2017-08-01 DIAGNOSIS — R079 Chest pain, unspecified: Secondary | ICD-10-CM | POA: Diagnosis not present

## 2017-08-01 DIAGNOSIS — D62 Acute posthemorrhagic anemia: Secondary | ICD-10-CM | POA: Diagnosis not present

## 2017-08-04 ENCOUNTER — Ambulatory Visit (INDEPENDENT_AMBULATORY_CARE_PROVIDER_SITE_OTHER): Payer: Medicare PPO | Admitting: Cardiology

## 2017-08-04 ENCOUNTER — Encounter: Payer: Self-pay | Admitting: Cardiology

## 2017-08-04 VITALS — BP 112/60 | HR 58 | Ht 65.0 in | Wt 247.4 lb

## 2017-08-04 DIAGNOSIS — I361 Nonrheumatic tricuspid (valve) insufficiency: Secondary | ICD-10-CM | POA: Diagnosis not present

## 2017-08-04 DIAGNOSIS — I2510 Atherosclerotic heart disease of native coronary artery without angina pectoris: Secondary | ICD-10-CM | POA: Diagnosis not present

## 2017-08-04 DIAGNOSIS — Z86711 Personal history of pulmonary embolism: Secondary | ICD-10-CM | POA: Diagnosis not present

## 2017-08-04 DIAGNOSIS — Z7901 Long term (current) use of anticoagulants: Secondary | ICD-10-CM

## 2017-08-04 DIAGNOSIS — I5032 Chronic diastolic (congestive) heart failure: Secondary | ICD-10-CM | POA: Diagnosis not present

## 2017-08-04 DIAGNOSIS — I480 Paroxysmal atrial fibrillation: Secondary | ICD-10-CM | POA: Diagnosis not present

## 2017-08-04 DIAGNOSIS — I2721 Secondary pulmonary arterial hypertension: Secondary | ICD-10-CM | POA: Insufficient documentation

## 2017-08-04 DIAGNOSIS — I071 Rheumatic tricuspid insufficiency: Secondary | ICD-10-CM | POA: Insufficient documentation

## 2017-08-04 MED ORDER — FUROSEMIDE 40 MG PO TABS: 40.0000 mg | ORAL_TABLET | Freq: Every day | ORAL | 11 refills | Status: DC

## 2017-08-04 NOTE — Progress Notes (Signed)
Cardiology Office Note:    Date:  08/04/2017   ID:  Jennifer Medina, DOB [birth year], MRN [id number]  PCP:  Nicoletta Dress, MD  Cardiologist:  Shirlee More, MD    Referring MD: Nicoletta Dress, MD    ASSESSMENT:    1. Chronic diastolic heart failure (HCC)   2. Paroxysmal atrial fibrillation ([place])   3. Chronic anticoagulation   4. History of pulmonary embolism   5. Non-rheumatic tricuspid valve insufficiency   6. Pulmonary artery hypertension ([place])   7. Coronary artery calcification seen on CT scan    PLAN:    In order of problems listed above:  1. She remains markedly volume overloaded up approximately 20 pounds from baseline and will alternate 40 and 80 mg of Lasix per day and if not responsive will need to switch to a higher dose torsemide. 2. Stable continue long-term anticoagulation with previous unprovoked massive pulmonary embolism. 3. Stable continue her current anticoagulant 4. Consider repeat echocardiogram at next visit to assess pulmonary pressure and vascular resistance. Her echocardiogram recent hospitalization is complicated by decompensated heart failure. Regardless it is significantly improved 5. Stable improved recent echocardiogram 6. Stable seemingly improve recent echocardiogram but requires follow-up with improvement in left heart failure 7. Stable having no anginal discomfort, at this time I would not pursue an ischemia evaluation   Next appointment:6  weeks   Medication Adjustments/Labs and Tests Ordered: Current medicines are reviewed at length with the patient today.  Concerns regarding medicines are outlined above.  No orders of the defined types were placed in this encounter.  No orders of the defined types were placed in this encounter.   Chief Complaint  Patient presents with  . Hospitalization Follow-up    per [facility] to evaluate CHF  . Congestive Heart Failure  . Shortness of Breath    History of Present Illness:    Jennifer Medina is a 82 y.o. female with a hx of paroxysmal atrial fibrillation, DVT and massivePE 01/15/17 with catheter guided lytic therapy and anticoagulation, PAH and moderate to severe TR , diastolic heart failure last seen more than 2 yrs ago. Her baseline weight increased > 20 lbs prior to her recent  admission with heart failure.Marland Kitchen She was admitted to [facility] in May with GI bleed and recently with decompensated HF and chest pain.Since discharge n her own she has been taking between 40-80 mg of furosemide daily with dose. She is very limited but no shortness of breath chest pain palpitation or syncope Compliance with diet, lifestyle and medications:  yes Past Medical History:  Diagnosis Date  . Acute massive pulmonary embolism ([place])    Pt admitted 01/15/17 with submassive pulmonary embolism. She was treated conservatively secondary to a history of ICH in 2009.   Marland Kitchen Acute pulmonary embolism ([place]) 01/15/2017  . AKI (acute kidney injury) ([place]) 01/20/2017  . Atrial fibrillation with RVR ([place])   . CHF (congestive heart failure) ([place])   . Chronic anticoagulation 02/06/2017  . DVT (deep venous thrombosis) ([place]) 02/06/2017   Rt LE DVT  . Elevated troponin   . GI bleed   . Hemoptysis 01/20/2017  . Hyperglycemia 01/15/2017  . ICH (intracerebral hemorrhage) ([place]) 2009  . Morbid obesity ([place]) 02/06/2017  . Pulmonary embolism ([place]) 01/15/2017   saddle embolism  . Saddle embolus of pulmonary artery with acute cor pulmonale (HCC)   . Severe right ventricular systolic dysfunction ([place])   . SOB (shortness of breath)   . Thrombocytopenia ([place]) 01/20/2017  .  Transaminitis 01/20/2017  . Wide-complex tachycardia ([place])     Past Surgical History:  Procedure Laterality Date  . APPENDECTOMY    . CHOLECYSTECTOMY    . IR GENERIC HISTORICAL  01/19/2017   IR THROMB F/U EVAL ART/VEN FINAL DAY (MS) 01/19/2017 Arne Cleveland, MD [facility]  . IR GENERIC HISTORICAL  01/18/2017   IR ANGIOGRAM PULMONARY BILATERAL SELECTIVE 01/18/2017  Arne Cleveland, MD [facility]  . IR GENERIC HISTORICAL  01/18/2017   IR ANGIOGRAM SELECTIVE EACH ADDITIONAL VESSEL 01/18/2017 Arne Cleveland, MD [facility]  . IR GENERIC HISTORICAL  01/18/2017   IR INFUSION THROMBOL ARTERIAL INITIAL (MS) 01/18/2017 Arne Cleveland, MD [facility]  . IR GENERIC HISTORICAL  01/18/2017   IR ANGIOGRAM SELECTIVE EACH ADDITIONAL VESSEL 01/18/2017 Arne Cleveland, MD [facility]  . IR GENERIC HISTORICAL  01/18/2017   IR US GUIDE VASC ACCESS RIGHT 01/18/2017 Arne Cleveland, MD [facility]  . IR GENERIC HISTORICAL  01/18/2017   IR INFUSION THROMBOL ARTERIAL INITIAL (MS) 01/18/2017 Arne Cleveland, MD [facility]  . KNEE SURGERY    . rotator cuff surgery      Current Medications: Current Meds  Medication Sig  . acetaminophen (TYLENOL) 325 MG tablet Take 650 mg by mouth every 6 (six) hours as needed for moderate pain or headache.   Marland Kitchen atorvastatin (LIPITOR) 40 MG tablet Take 40 mg by mouth at bedtime.  . Camphor (JOINTFLEX EX) Apply 1 application topically as needed (for pain).  . furosemide (LASIX) 40 MG tablet Take 40 mg by mouth daily.  Marland Kitchen latanoprost (XALATAN) 0.005 % ophthalmic solution Place 1 drop into both eyes at bedtime.  Marland Kitchen LORazepam (ATIVAN) 0.5 MG tablet Take 1 tablet (0.5 mg total) by mouth every 8 (eight) hours as needed for anxiety.  . metoprolol succinate (TOPROL-XL) 25 MG 24 hr tablet Take 25 mg by mouth daily.  . nitroGLYCERIN (NITROSTAT) 0.4 MG SL tablet Place 0.4 mg under the tongue every 5 (five) minutes as needed for chest pain.  Marland Kitchen Omeprazole 20 MG TBEC Take 20 mg by mouth daily.   . potassium chloride SA (K-DUR,KLOR-CON) 20 MEQ tablet Take 20 mEq by mouth daily.  . rivaroxaban (XARELTO) 20 MG TABS tablet Take 1 tablet (20 mg total) by mouth daily with supper.  . traMADol (ULTRAM) 50 MG tablet Take 1 tablet (50 mg total) by mouth every 6 (six) hours as needed for moderate pain.     Allergies:   Aspirin; Nsaids; Penicillins; Simvastatin; and  Vancomycin   Social History   Social History  . Marital status: Widowed    Spouse name: N/A  . Number of children: N/A  . Years of education: N/A   Social History Main Topics  . Smoking status: Never Smoker  . Smokeless tobacco: Never Used  . Alcohol use No  . Drug use: No  . Sexual activity: Not Asked   Other Topics Concern  . None   Social History Narrative  . None     Family History: The patient's family history includes CAD in her father; Hypertension in her mother; Stroke in her mother. ROS:   Please see the history of present illness.    All other systems reviewed and are negative.  EKGs/Labs/Other Studies Reviewed:    The following studies were reviewed today: Echo 3/13>> - Left ventricle: The cavity size was normal. Systolic function was normal. The estimated ejection fraction was in the range of 55% to 60%. Wall motion was normal; there were no regional wall motion  abnormalities. Doppler parameters are consistent with abnormal left ventricular relaxation (grade 1 diastolic dysfunction). - Ventricular septum: Septal motion showed paradox. These changes are consistent with RV-LV interaction. - Left atrium: The atrium was mildly dilated. - Right ventricle: The cavity size was dilated. Wall thickness was normal. Systolic function was severely reduced. - Right atrium: The atrium was moderately dilated. - Tricuspid valve: There was moderate-severe regurgitation. - Pulmonary arteries: Systolic pressure was moderately increased. PA peak pressure: 64 mm Hg   Echo09/15/18: EF 55-6-%, elevated LA pressure, RV RA both normal size, normal RV function, mild TR and intermediate probability of PAH with dilated PA on CT scan  Recent Labs: 07/27/17 Hgb 9.2, BMP normal, BNP 1720 Troponin undetectable 08/01/17 hgb 11.5,Cr 1.0K 3.7 01/15/2017: B Natriuretic Peptide 841.8 01/22/2017: ALT 160; Magnesium 1.4 03/14/2017: BUN 26; Creatinine, Ser 0.98; Hemoglobin  12.5; Platelets 217; Potassium 3.0; Sodium 137  Recent Lipid Panel No results found for: CHOL, TRIG, HDL, CHOLHDL, VLDL, LDLCALC, LDLDIRECT  Physical Exam:    VS:  Ht 5\' 5"  (1.651 m)   Wt 247 lb 6.4 oz (112.2 kg)   BMI 41.17 kg/m     Wt Readings from Last 3 Encounters:  08/04/17 247 lb 6.4 oz (112.2 kg)  03/14/17 224 lb (101.6 kg)  02/18/17 224 lb (101.6 kg)     GEN:  Well nourished, well developed in no acute distress HEENT: Normal NECK: No JVD; No carotid bruits LYMPHATICS: No lymphadenopathy CARDIAC: RRR, no murmurs, rubs, gallops RESPIRATORY:  Clear to auscultation without rales, wheezing or rhonchi  ABDOMEN: Soft, non-tender, non-distended MUSCULOSKELETAL:  4+  Edema to the thighs; No deformity  SKIN: Warm and dry NEUROLOGIC:  Alert and oriented x 3 PSYCHIATRIC:  Normal affect    Signed, Shirlee More, MD  08/04/2017 1:43 PM    [facility]

## 2017-08-04 NOTE — Patient Instructions (Addendum)
Medication Instructions:  Your physician has recommended you make the following change in your medication:   CHANGE Lasix 40mg  daily Sat, Sun, Tue, and [place]. Lasix 40mg  twice a day Mon, Wed, and Friday.  Labwork: None  Testing/Procedures: None  Follow-Up: Your physician recommends that you schedule a follow-up appointment in: 6 weeks  Any Other Special Instructions Will Be Listed Below (If Applicable).     If you need a refill on your cardiac medications before your next appointment, please call your pharmacy.    Heart Failure  Weigh yourself every morning when you first wake up and record on a calender or note pad, bring this to your office visits. Using a pill tender can help with taking your medications consistently.  Limit your fluid intake to 2 liters daily  Limit your sodium intake to less than 2-3 grams daily. Ask if you need dietary teaching.  If you gain more than 3 pounds (from your dry weight ), double your dose of diuretic for the day.  If you gain more than 5 pounds (from your dry weight), double your dose of lasix and call your heart failure doctor.  Please do not smoke tobacco since it is very bad for your heart.  Please do not drink alcohol since it can worsen your heart failure.Also avoid OTC nonsteroidal drugs, such as advil, aleve and motrin.  Try to exercise for at least 30 minutes every day because this will help your heart be more efficient. You may be eligible for supervised cardiac rehab, ask your physician.

## 2017-08-05 DIAGNOSIS — K219 Gastro-esophageal reflux disease without esophagitis: Secondary | ICD-10-CM | POA: Diagnosis not present

## 2017-08-05 DIAGNOSIS — Z6839 Body mass index (BMI) 39.0-39.9, adult: Secondary | ICD-10-CM | POA: Diagnosis not present

## 2017-08-05 DIAGNOSIS — Z9181 History of falling: Secondary | ICD-10-CM | POA: Diagnosis not present

## 2017-08-05 DIAGNOSIS — I509 Heart failure, unspecified: Secondary | ICD-10-CM | POA: Diagnosis not present

## 2017-08-05 DIAGNOSIS — E7800 Pure hypercholesterolemia, unspecified: Secondary | ICD-10-CM | POA: Diagnosis not present

## 2017-08-05 DIAGNOSIS — Z7901 Long term (current) use of anticoagulants: Secondary | ICD-10-CM | POA: Diagnosis not present

## 2017-08-05 DIAGNOSIS — Z86711 Personal history of pulmonary embolism: Secondary | ICD-10-CM | POA: Diagnosis not present

## 2017-08-05 DIAGNOSIS — I110 Hypertensive heart disease with heart failure: Secondary | ICD-10-CM | POA: Diagnosis not present

## 2017-08-08 DIAGNOSIS — Z86711 Personal history of pulmonary embolism: Secondary | ICD-10-CM | POA: Diagnosis not present

## 2017-08-08 DIAGNOSIS — Z86718 Personal history of other venous thrombosis and embolism: Secondary | ICD-10-CM | POA: Diagnosis not present

## 2017-08-08 DIAGNOSIS — R760 Raised antibody titer: Secondary | ICD-10-CM | POA: Diagnosis not present

## 2017-08-11 DIAGNOSIS — Z9181 History of falling: Secondary | ICD-10-CM | POA: Diagnosis not present

## 2017-08-11 DIAGNOSIS — E7800 Pure hypercholesterolemia, unspecified: Secondary | ICD-10-CM | POA: Diagnosis not present

## 2017-08-11 DIAGNOSIS — I110 Hypertensive heart disease with heart failure: Secondary | ICD-10-CM | POA: Diagnosis not present

## 2017-08-11 DIAGNOSIS — I509 Heart failure, unspecified: Secondary | ICD-10-CM | POA: Diagnosis not present

## 2017-08-11 DIAGNOSIS — Z7901 Long term (current) use of anticoagulants: Secondary | ICD-10-CM | POA: Diagnosis not present

## 2017-08-11 DIAGNOSIS — K219 Gastro-esophageal reflux disease without esophagitis: Secondary | ICD-10-CM | POA: Diagnosis not present

## 2017-08-11 DIAGNOSIS — Z86711 Personal history of pulmonary embolism: Secondary | ICD-10-CM | POA: Diagnosis not present

## 2017-08-11 DIAGNOSIS — Z6839 Body mass index (BMI) 39.0-39.9, adult: Secondary | ICD-10-CM | POA: Diagnosis not present

## 2017-08-14 DIAGNOSIS — Z86711 Personal history of pulmonary embolism: Secondary | ICD-10-CM | POA: Diagnosis not present

## 2017-08-14 DIAGNOSIS — Z6839 Body mass index (BMI) 39.0-39.9, adult: Secondary | ICD-10-CM | POA: Diagnosis not present

## 2017-08-14 DIAGNOSIS — I110 Hypertensive heart disease with heart failure: Secondary | ICD-10-CM | POA: Diagnosis not present

## 2017-08-14 DIAGNOSIS — K219 Gastro-esophageal reflux disease without esophagitis: Secondary | ICD-10-CM | POA: Diagnosis not present

## 2017-08-14 DIAGNOSIS — E119 Type 2 diabetes mellitus without complications: Secondary | ICD-10-CM | POA: Diagnosis not present

## 2017-08-14 DIAGNOSIS — Z7901 Long term (current) use of anticoagulants: Secondary | ICD-10-CM | POA: Diagnosis not present

## 2017-08-14 DIAGNOSIS — E7800 Pure hypercholesterolemia, unspecified: Secondary | ICD-10-CM | POA: Diagnosis not present

## 2017-08-14 DIAGNOSIS — H401131 Primary open-angle glaucoma, bilateral, mild stage: Secondary | ICD-10-CM | POA: Diagnosis not present

## 2017-08-14 DIAGNOSIS — Z9181 History of falling: Secondary | ICD-10-CM | POA: Diagnosis not present

## 2017-08-14 DIAGNOSIS — I509 Heart failure, unspecified: Secondary | ICD-10-CM | POA: Diagnosis not present

## 2017-08-20 DIAGNOSIS — Z86711 Personal history of pulmonary embolism: Secondary | ICD-10-CM | POA: Diagnosis not present

## 2017-08-20 DIAGNOSIS — Z9181 History of falling: Secondary | ICD-10-CM | POA: Diagnosis not present

## 2017-08-20 DIAGNOSIS — E7800 Pure hypercholesterolemia, unspecified: Secondary | ICD-10-CM | POA: Diagnosis not present

## 2017-08-20 DIAGNOSIS — Z6839 Body mass index (BMI) 39.0-39.9, adult: Secondary | ICD-10-CM | POA: Diagnosis not present

## 2017-08-20 DIAGNOSIS — Z7901 Long term (current) use of anticoagulants: Secondary | ICD-10-CM | POA: Diagnosis not present

## 2017-08-20 DIAGNOSIS — K219 Gastro-esophageal reflux disease without esophagitis: Secondary | ICD-10-CM | POA: Diagnosis not present

## 2017-08-20 DIAGNOSIS — I110 Hypertensive heart disease with heart failure: Secondary | ICD-10-CM | POA: Diagnosis not present

## 2017-08-20 DIAGNOSIS — I509 Heart failure, unspecified: Secondary | ICD-10-CM | POA: Diagnosis not present

## 2017-08-24 DIAGNOSIS — Z7901 Long term (current) use of anticoagulants: Secondary | ICD-10-CM | POA: Diagnosis not present

## 2017-08-24 DIAGNOSIS — I509 Heart failure, unspecified: Secondary | ICD-10-CM | POA: Diagnosis not present

## 2017-08-24 DIAGNOSIS — Z86711 Personal history of pulmonary embolism: Secondary | ICD-10-CM | POA: Diagnosis not present

## 2017-08-24 DIAGNOSIS — Z6839 Body mass index (BMI) 39.0-39.9, adult: Secondary | ICD-10-CM | POA: Diagnosis not present

## 2017-08-24 DIAGNOSIS — K219 Gastro-esophageal reflux disease without esophagitis: Secondary | ICD-10-CM | POA: Diagnosis not present

## 2017-08-24 DIAGNOSIS — I110 Hypertensive heart disease with heart failure: Secondary | ICD-10-CM | POA: Diagnosis not present

## 2017-08-24 DIAGNOSIS — E7800 Pure hypercholesterolemia, unspecified: Secondary | ICD-10-CM | POA: Diagnosis not present

## 2017-08-24 DIAGNOSIS — Z9181 History of falling: Secondary | ICD-10-CM | POA: Diagnosis not present

## 2017-08-27 DIAGNOSIS — I5032 Chronic diastolic (congestive) heart failure: Secondary | ICD-10-CM | POA: Diagnosis not present

## 2017-08-27 DIAGNOSIS — I480 Paroxysmal atrial fibrillation: Secondary | ICD-10-CM | POA: Diagnosis not present

## 2017-08-27 DIAGNOSIS — I2602 Saddle embolus of pulmonary artery with acute cor pulmonale: Secondary | ICD-10-CM | POA: Diagnosis not present

## 2017-08-27 DIAGNOSIS — I82401 Acute embolism and thrombosis of unspecified deep veins of right lower extremity: Secondary | ICD-10-CM | POA: Diagnosis not present

## 2017-08-28 DIAGNOSIS — Z9181 History of falling: Secondary | ICD-10-CM | POA: Diagnosis not present

## 2017-08-28 DIAGNOSIS — K219 Gastro-esophageal reflux disease without esophagitis: Secondary | ICD-10-CM | POA: Diagnosis not present

## 2017-08-28 DIAGNOSIS — Z86711 Personal history of pulmonary embolism: Secondary | ICD-10-CM | POA: Diagnosis not present

## 2017-08-28 DIAGNOSIS — Z7901 Long term (current) use of anticoagulants: Secondary | ICD-10-CM | POA: Diagnosis not present

## 2017-08-28 DIAGNOSIS — I509 Heart failure, unspecified: Secondary | ICD-10-CM | POA: Diagnosis not present

## 2017-08-28 DIAGNOSIS — E7800 Pure hypercholesterolemia, unspecified: Secondary | ICD-10-CM | POA: Diagnosis not present

## 2017-08-28 DIAGNOSIS — I110 Hypertensive heart disease with heart failure: Secondary | ICD-10-CM | POA: Diagnosis not present

## 2017-08-28 DIAGNOSIS — Z6839 Body mass index (BMI) 39.0-39.9, adult: Secondary | ICD-10-CM | POA: Diagnosis not present

## 2017-09-09 DIAGNOSIS — Z23 Encounter for immunization: Secondary | ICD-10-CM | POA: Diagnosis not present

## 2017-09-12 NOTE — Progress Notes (Deleted)
Cardiology Office Note:    Date:  09/12/2017   ID:  Jennifer Medina, DOB 25-Jul-1935, MRN [id number]  PCP:  Nicoletta Dress, MD  Cardiologist:  Shirlee More, MD    Referring MD: Nicoletta Dress, MD    ASSESSMENT:    No diagnosis found. PLAN:    In order of problems listed above:  1. ***   Next appointment: ***   Medication Adjustments/Labs and Tests Ordered: Current medicines are reviewed at length with the patient today.  Concerns regarding medicines are outlined above.  No orders of the defined types were placed in this encounter.  No orders of the defined types were placed in this encounter.   No chief complaint on file.   History of Present Illness:    Jennifer Medina is a 82 y.o. female with a hx of paroxysmal atrial fibrillation, DVT and massivePE 01/15/17 with catheter guided lytic therapy and anticoagulation, PAH and moderate to severe TR , diastolic heart failure and subsequently she was admitted to [facility] in May with GI bleed and recently with decompensated HF last seen 08/04/17.. Compliance with diet, lifestyle and medications: *** Past Medical History:  Diagnosis Date  . Acute massive pulmonary embolism ([place])    Pt admitted 01/15/17 with submassive pulmonary embolism. She was treated conservatively secondary to a history of ICH in 2009.   Marland Kitchen Acute pulmonary embolism ([place]) 01/15/2017  . AKI (acute kidney injury) ([place]) 01/20/2017  . Atrial fibrillation with RVR ([place])   . CHF (congestive heart failure) ([place])   . Chronic anticoagulation 02/06/2017  . DVT (deep venous thrombosis) ([place]) 02/06/2017   Rt LE DVT  . Elevated troponin   . GI bleed   . Hemoptysis 01/20/2017  . Hyperglycemia 01/15/2017  . ICH (intracerebral hemorrhage) ([place]) 2009  . Morbid obesity ([place]) 02/06/2017  . Pulmonary embolism ([place]) 01/15/2017   saddle embolism  . Saddle embolus of pulmonary artery with acute cor pulmonale (HCC)   . Severe right ventricular systolic dysfunction ([place])   .  SOB (shortness of breath)   . Thrombocytopenia ([place]) 01/20/2017  . Transaminitis 01/20/2017  . Wide-complex tachycardia ([place])     Past Surgical History:  Procedure Laterality Date  . APPENDECTOMY    . CHOLECYSTECTOMY    . IR GENERIC HISTORICAL  01/19/2017   IR THROMB F/U EVAL ART/VEN FINAL DAY (MS) 01/19/2017 Arne Cleveland, MD [facility]  . IR GENERIC HISTORICAL  01/18/2017   IR ANGIOGRAM PULMONARY BILATERAL SELECTIVE 01/18/2017 Arne Cleveland, MD [facility]  . IR GENERIC HISTORICAL  01/18/2017   IR ANGIOGRAM SELECTIVE EACH ADDITIONAL VESSEL 01/18/2017 Arne Cleveland, MD [facility]  . IR GENERIC HISTORICAL  01/18/2017   IR INFUSION THROMBOL ARTERIAL INITIAL (MS) 01/18/2017 Arne Cleveland, MD [facility]  . IR GENERIC HISTORICAL  01/18/2017   IR ANGIOGRAM SELECTIVE EACH ADDITIONAL VESSEL 01/18/2017 Arne Cleveland, MD [facility]  . IR GENERIC HISTORICAL  01/18/2017   IR US GUIDE VASC ACCESS RIGHT 01/18/2017 Arne Cleveland, MD [facility]  . IR GENERIC HISTORICAL  01/18/2017   IR INFUSION THROMBOL ARTERIAL INITIAL (MS) 01/18/2017 Arne Cleveland, MD [facility]  . KNEE SURGERY    . rotator cuff surgery      Current Medications: No outpatient prescriptions have been marked as taking for the 09/15/17 encounter (Appointment) with Richardo Priest, MD.     Allergies:   Aspirin; Nsaids; Penicillins; Simvastatin; and Vancomycin   Social History   Social History  . Marital status: Widowed  Spouse name: N/A  . Number of children: N/A  . Years of education: N/A   Social History Main Topics  . Smoking status: Never Smoker  . Smokeless tobacco: Never Used  . Alcohol use No  . Drug use: No  . Sexual activity: Not on file   Other Topics Concern  . Not on file   Social History Narrative  . No narrative on file     Family History: The patient's ***family history includes CAD in her father; Hypertension in her mother; Stroke in her mother. ROS:   Please see the history of  present illness.    All other systems reviewed and are negative.  EKGs/Labs/Other Studies Reviewed:    The following studies were reviewed today:  EKG:  EKG ordered today.  The ekg ordered today demonstrates ***  Recent Labs: 01/15/2017: B Natriuretic Peptide 841.8 01/22/2017: ALT 160; Magnesium 1.4 03/14/2017: BUN 26; Creatinine, Ser 0.98; Hemoglobin 12.5; Platelets 217; Potassium 3.0; Sodium 137  Recent Lipid Panel No results found for: CHOL, TRIG, HDL, CHOLHDL, VLDL, LDLCALC, LDLDIRECT  Physical Exam:    VS:  There were no vitals taken for this visit.    Wt Readings from Last 3 Encounters:  08/04/17 247 lb 6.4 oz (112.2 kg)  03/14/17 224 lb (101.6 kg)  02/18/17 224 lb (101.6 kg)     GEN: *** Well nourished, well developed in no acute distress HEENT: Normal NECK: No JVD; No carotid bruits LYMPHATICS: No lymphadenopathy CARDIAC: ***RRR, no murmurs, rubs, gallops RESPIRATORY:  Clear to auscultation without rales, wheezing or rhonchi  ABDOMEN: Soft, non-tender, non-distended MUSCULOSKELETAL:  No edema; No deformity  SKIN: Warm and dry NEUROLOGIC:  Alert and oriented x 3 PSYCHIATRIC:  Normal affect    Signed, Shirlee More, MD  09/12/2017 8:52 AM    [place]

## 2017-09-15 ENCOUNTER — Ambulatory Visit: Payer: Medicare PPO | Admitting: Cardiology

## 2017-09-27 DIAGNOSIS — I2602 Saddle embolus of pulmonary artery with acute cor pulmonale: Secondary | ICD-10-CM | POA: Diagnosis not present

## 2017-09-27 DIAGNOSIS — I82401 Acute embolism and thrombosis of unspecified deep veins of right lower extremity: Secondary | ICD-10-CM | POA: Diagnosis not present

## 2017-09-27 DIAGNOSIS — I5032 Chronic diastolic (congestive) heart failure: Secondary | ICD-10-CM | POA: Diagnosis not present

## 2017-09-27 DIAGNOSIS — I480 Paroxysmal atrial fibrillation: Secondary | ICD-10-CM | POA: Diagnosis not present

## 2017-10-13 NOTE — Progress Notes (Signed)
Cardiology Office Note:    Date:  10/14/2017   ID:  Jennifer Medina, DOB 01-11-1935, MRN [id number]  PCP:  Nicoletta Dress, MD  Cardiologist:  Shirlee More, MD    Referring MD: Nicoletta Dress, MD  Please do a BMP and BNP at your visit   ASSESSMENT:    1. Chronic diastolic heart failure (HCC)   2. Paroxysmal atrial fibrillation ([place])   3. Chronic anticoagulation   4. History of pulmonary embolism    PLAN:    In order of problems listed above:  1. Clearly improved although she remains volume overloaded and is noncompliant with self-management.  I stressed the importance of weighing every day same time recording her weights and using a pillbox for compliance with her medications.  She will double her efforts on sodium restriction.  She will increase the dose of her diuretic to 80 mg every day furosemide.  Because of distance and difficulty with travel she will have labs drawn at her next PCP office and I will see back in 6 months or sooner if she is symptomatic with shortness of breath. 2. Stable continue her anticoagulant 3. Stable continue anticoagulation with unprovoked pulmonary embolism 4. Stable RV function and pulmonary pressure was normal on recent echocardiogram.   Next appointment: 6 months   Medication Adjustments/Labs and Tests Ordered: Current medicines are reviewed at length with the patient today.  Concerns regarding medicines are outlined above.  No orders of the defined types were placed in this encounter.  Meds ordered this encounter  Medications  . furosemide (LASIX) 40 MG tablet    Sig: Take 2 tablets (80 mg total) by mouth daily.    Dispense:  45 tablet    Refill:  11    Chief Complaint  Patient presents with  . 6 week follow up  . Congestive Heart Failure    History of Present Illness:    Jennifer Medina is a 82 y.o. female with a hx of paroxysmal atrial fibrillation, DVT and massive PE 01/15/17 with catheter guided lytic therapy and  anticoagulation, PAH and moderate to severe TR , diastolic heart failure last seen 3 months ago.her echo was consistent with diastolic heart failure, RV function was normal, TR maximum 31 mm Hg. Compliance with diet, lifestyle and medications: No, she is not weighing daily and is not using the pillbox.  From the best of her description her weight is up approximately 8 pounds however she is improved is no longer short of breath and has no orthopnea or PND.  She tells me paradoxically that her legs are good in the evening and swollen in the mornings.  She is eating most of her meals at home and has a caregiver who is supervising her diet. Past Medical History:  Diagnosis Date  . Acute massive pulmonary embolism ([place])    Pt admitted 01/15/17 with submassive pulmonary embolism. She was treated conservatively secondary to a history of ICH in 2009.   Marland Kitchen Acute pulmonary embolism ([place]) 01/15/2017  . AKI (acute kidney injury) ([place]) 01/20/2017  . Atrial fibrillation with RVR ([place])   . CHF (congestive heart failure) ([place])   . Chronic anticoagulation 02/06/2017  . DVT (deep venous thrombosis) ([place]) 02/06/2017   Rt LE DVT  . Elevated troponin   . GI bleed   . Hemoptysis 01/20/2017  . Hyperglycemia 01/15/2017  . ICH (intracerebral hemorrhage) ([place]) 2009  . Morbid obesity ([place]) 02/06/2017  . Pulmonary embolism ([place]) 01/15/2017   saddle embolism  .  Saddle embolus of pulmonary artery with acute cor pulmonale (HCC)   . Severe right ventricular systolic dysfunction ([place])   . SOB (shortness of breath)   . Thrombocytopenia ([place]) 01/20/2017  . Transaminitis 01/20/2017  . Wide-complex tachycardia ([place])     Past Surgical History:  Procedure Laterality Date  . APPENDECTOMY    . CHOLECYSTECTOMY    . IR GENERIC HISTORICAL  01/19/2017   IR THROMB F/U EVAL ART/VEN FINAL DAY (MS) 01/19/2017 Arne Cleveland, MD [facility]  . IR GENERIC HISTORICAL  01/18/2017   IR ANGIOGRAM PULMONARY BILATERAL SELECTIVE 01/18/2017 Arne Cleveland, MD [facility]  . IR GENERIC HISTORICAL  01/18/2017   IR ANGIOGRAM SELECTIVE EACH ADDITIONAL VESSEL 01/18/2017 Arne Cleveland, MD [facility]  . IR GENERIC HISTORICAL  01/18/2017   IR INFUSION THROMBOL ARTERIAL INITIAL (MS) 01/18/2017 Arne Cleveland, MD [facility]  . IR GENERIC HISTORICAL  01/18/2017   IR ANGIOGRAM SELECTIVE EACH ADDITIONAL VESSEL 01/18/2017 Arne Cleveland, MD [facility]  . IR GENERIC HISTORICAL  01/18/2017   IR US GUIDE VASC ACCESS RIGHT 01/18/2017 Arne Cleveland, MD [facility]  . IR GENERIC HISTORICAL  01/18/2017   IR INFUSION THROMBOL ARTERIAL INITIAL (MS) 01/18/2017 Arne Cleveland, MD [facility]  . KNEE SURGERY    . rotator cuff surgery      Current Medications: Current Meds  Medication Sig  . acetaminophen (TYLENOL) 325 MG tablet Take 650 mg by mouth every 6 (six) hours as needed for moderate pain or headache.   Marland Kitchen atorvastatin (LIPITOR) 40 MG tablet Take 40 mg by mouth at bedtime.  . Camphor (JOINTFLEX EX) Apply 1 application topically as needed (for pain).  . furosemide (LASIX) 40 MG tablet Take 2 tablets (80 mg total) by mouth daily.  Marland Kitchen guaiFENesin (MUCINEX) 600 MG 12 hr tablet Take 600 mg by mouth as directed.  . latanoprost (XALATAN) 0.005 % ophthalmic solution Place 1 drop into both eyes at bedtime.  Marland Kitchen loperamide (IMODIUM A-D) 2 MG tablet Take 2 mg by mouth 4 (four) times daily as needed for diarrhea or loose stools.  Marland Kitchen LORazepam (ATIVAN) 0.5 MG tablet Take 1 tablet (0.5 mg total) by mouth every 8 (eight) hours as needed for anxiety.  . metoprolol succinate (TOPROL-XL) 25 MG 24 hr tablet Take 25 mg by mouth daily.  . nitroGLYCERIN (NITROSTAT) 0.4 MG SL tablet Place 0.4 mg under the tongue every 5 (five) minutes as needed for chest pain.  Marland Kitchen Omeprazole 20 MG TBEC Take 20 mg by mouth daily.   . potassium chloride SA (K-DUR,KLOR-CON) 20 MEQ tablet Take 20 mEq by mouth daily.  . rivaroxaban (XARELTO) 20 MG TABS tablet Take 1 tablet (20 mg total) by  mouth daily with supper.  . sodium chloride (OCEAN) 0.65 % SOLN nasal spray Place 1 spray into both nostrils as needed for congestion. Please use 10 times a day  . traMADol (ULTRAM) 50 MG tablet Take 1 tablet (50 mg total) by mouth every 6 (six) hours as needed for moderate pain.  . [DISCONTINUED] furosemide (LASIX) 40 MG tablet Take 1 tablet (40 mg total) by mouth daily. Take twice daily on Monday Wednesday and Friday     Allergies:   Aspirin; Nsaids; Penicillins; Simvastatin; and Vancomycin   Social History   Socioeconomic History  . Marital status: Widowed    Spouse name: None  . Number of children: None  . Years of education: None  . Highest education level: None  Social Needs  . Financial resource strain:  None  . Food insecurity - worry: None  . Food insecurity - inability: None  . Transportation needs - medical: None  . Transportation needs - non-medical: None  Occupational History  . None  Tobacco Use  . Smoking status: Never Smoker  . Smokeless tobacco: Never Used  Substance and Sexual Activity  . Alcohol use: No  . Drug use: No  . Sexual activity: None  Other Topics Concern  . None  Social History Narrative  . None     Family History: The patient's family history includes CAD in her father; Hypertension in her mother; Stroke in her mother. ROS:   Please see the history of present illness.    All other systems reviewed and are negative.  EKGs/Labs/Other Studies Reviewed:    The following studies were reviewed today:   Recent Labs: Hgb 11.5, recent CMP is normal 01/15/2017: B Natriuretic Peptide 841.8 01/22/2017: ALT 160; Magnesium 1.4 03/14/2017: BUN 26; Creatinine, Ser 0.98; Hemoglobin 12.5; Platelets 217; Potassium 3.0; Sodium 137  Recent Lipid Panel No results found for: CHOL, TRIG, HDL, CHOLHDL, VLDL, LDLCALC, LDLDIRECT  Physical Exam:    VS:  BP 136/66   Pulse 66   Ht 5\' 5"  (1.651 m)   Wt 251 lb 1.9 oz (113.9 kg)   SpO2 97%   BMI 41.79 kg/m      Wt Readings from Last 3 Encounters:  10/14/17 251 lb 1.9 oz (113.9 kg)  08/04/17 247 lb 6.4 oz (112.2 kg)  03/14/17 224 lb (101.6 kg)     GEN:  Well nourished, well developed in no acute distress HEENT: Normal NECK: No JVD; No carotid bruits LYMPHATICS: No lymphadenopathy CARDIAC: RRR, no murmurs, rubs, gallops RESPIRATORY:  Clear to auscultation without rales, wheezing or rhonchi  ABDOMEN: Soft, non-tender, non-distended MUSCULOSKELETAL:  4+  edema; No deformity  SKIN: Warm and dry NEUROLOGIC:  Alert and oriented x 3 PSYCHIATRIC:  Normal affect    Signed, Shirlee More, MD  10/14/2017 10:54 AM    [place]

## 2017-10-14 ENCOUNTER — Encounter: Payer: Self-pay | Admitting: Cardiology

## 2017-10-14 ENCOUNTER — Ambulatory Visit (INDEPENDENT_AMBULATORY_CARE_PROVIDER_SITE_OTHER): Payer: Medicare PPO | Admitting: Cardiology

## 2017-10-14 VITALS — BP 136/66 | HR 66 | Ht 65.0 in | Wt 251.1 lb

## 2017-10-14 DIAGNOSIS — Z7901 Long term (current) use of anticoagulants: Secondary | ICD-10-CM | POA: Diagnosis not present

## 2017-10-14 DIAGNOSIS — I5032 Chronic diastolic (congestive) heart failure: Secondary | ICD-10-CM | POA: Diagnosis not present

## 2017-10-14 DIAGNOSIS — I480 Paroxysmal atrial fibrillation: Secondary | ICD-10-CM | POA: Diagnosis not present

## 2017-10-14 DIAGNOSIS — Z86711 Personal history of pulmonary embolism: Secondary | ICD-10-CM | POA: Diagnosis not present

## 2017-10-14 MED ORDER — FUROSEMIDE 40 MG PO TABS: 80.0000 mg | ORAL_TABLET | Freq: Every day | ORAL | 11 refills | Status: AC

## 2017-10-14 NOTE — Patient Instructions (Addendum)
Medication Instructions:  Your physician recommends that you continue on your current medications as directed. Please refer to the Current Medication list given to you today.   Labwork: None  Testing/Procedures: None  Follow-Up: Your physician wants you to follow-up in: 6 months. You will receive a reminder letter in the mail two months in advance. If you don't receive a letter, please call our office to schedule the follow-up appointment.   Any Other Special Instructions Will Be Listed Below (If Applicable).     If you need a refill on your cardiac medications before your next appointment, please call your pharmacy.    Heart Failure  Weigh yourself every morning when you first wake up and record on a calender or note pad, bring this to your office visits. Using a pill tender can help with taking your medications consistently.  Limit your fluid intake to 2 liters daily  Limit your sodium intake to less than 2-3 grams daily. Ask if you need dietary teaching.  If you gain more than 3 pounds (from your dry weight ), double your dose of diuretic for the day.  If you gain more than 5 pounds (from your dry weight), double your dose of lasix and call your heart failure doctor.  Please do not smoke tobacco since it is very bad for your heart.  Please do not drink alcohol since it can worsen your heart failure.Also avoid OTC nonsteroidal drugs, such as advil, aleve and motrin.  Try to exercise for at least 30 minutes every day because this will help your heart be more efficient. You may be eligible for supervised cardiac rehab, ask your physician.     

## 2017-10-27 DIAGNOSIS — I480 Paroxysmal atrial fibrillation: Secondary | ICD-10-CM | POA: Diagnosis not present

## 2017-10-27 DIAGNOSIS — I82401 Acute embolism and thrombosis of unspecified deep veins of right lower extremity: Secondary | ICD-10-CM | POA: Diagnosis not present

## 2017-10-27 DIAGNOSIS — I2602 Saddle embolus of pulmonary artery with acute cor pulmonale: Secondary | ICD-10-CM | POA: Diagnosis not present

## 2017-10-27 DIAGNOSIS — I5032 Chronic diastolic (congestive) heart failure: Secondary | ICD-10-CM | POA: Diagnosis not present

## 2017-11-27 DIAGNOSIS — I480 Paroxysmal atrial fibrillation: Secondary | ICD-10-CM | POA: Diagnosis not present

## 2017-11-27 DIAGNOSIS — I2602 Saddle embolus of pulmonary artery with acute cor pulmonale: Secondary | ICD-10-CM | POA: Diagnosis not present

## 2017-11-27 DIAGNOSIS — I5032 Chronic diastolic (congestive) heart failure: Secondary | ICD-10-CM | POA: Diagnosis not present

## 2017-11-27 DIAGNOSIS — I82401 Acute embolism and thrombosis of unspecified deep veins of right lower extremity: Secondary | ICD-10-CM | POA: Diagnosis not present

## 2017-11-28 DIAGNOSIS — Z1231 Encounter for screening mammogram for malignant neoplasm of breast: Secondary | ICD-10-CM | POA: Diagnosis not present

## 2017-12-11 DIAGNOSIS — Z1389 Encounter for screening for other disorder: Secondary | ICD-10-CM | POA: Diagnosis not present

## 2017-12-11 DIAGNOSIS — E669 Obesity, unspecified: Secondary | ICD-10-CM | POA: Diagnosis not present

## 2017-12-11 DIAGNOSIS — E785 Hyperlipidemia, unspecified: Secondary | ICD-10-CM | POA: Diagnosis not present

## 2017-12-11 DIAGNOSIS — Z1331 Encounter for screening for depression: Secondary | ICD-10-CM | POA: Diagnosis not present

## 2017-12-11 DIAGNOSIS — Z6841 Body Mass Index (BMI) 40.0 and over, adult: Secondary | ICD-10-CM | POA: Diagnosis not present

## 2017-12-11 DIAGNOSIS — Z136 Encounter for screening for cardiovascular disorders: Secondary | ICD-10-CM | POA: Diagnosis not present

## 2017-12-11 DIAGNOSIS — Z9181 History of falling: Secondary | ICD-10-CM | POA: Diagnosis not present

## 2017-12-11 DIAGNOSIS — Z0000 Encounter for general adult medical examination without abnormal findings: Secondary | ICD-10-CM | POA: Diagnosis not present

## 2017-12-28 DIAGNOSIS — I480 Paroxysmal atrial fibrillation: Secondary | ICD-10-CM | POA: Diagnosis not present

## 2017-12-28 DIAGNOSIS — I82401 Acute embolism and thrombosis of unspecified deep veins of right lower extremity: Secondary | ICD-10-CM | POA: Diagnosis not present

## 2017-12-28 DIAGNOSIS — I2602 Saddle embolus of pulmonary artery with acute cor pulmonale: Secondary | ICD-10-CM | POA: Diagnosis not present

## 2017-12-28 DIAGNOSIS — I5032 Chronic diastolic (congestive) heart failure: Secondary | ICD-10-CM | POA: Diagnosis not present

## 2018-01-06 DIAGNOSIS — R0981 Nasal congestion: Secondary | ICD-10-CM | POA: Diagnosis not present

## 2018-01-06 DIAGNOSIS — M1990 Unspecified osteoarthritis, unspecified site: Secondary | ICD-10-CM | POA: Diagnosis not present

## 2018-01-06 DIAGNOSIS — I480 Paroxysmal atrial fibrillation: Secondary | ICD-10-CM | POA: Diagnosis not present

## 2018-01-06 DIAGNOSIS — Z6841 Body Mass Index (BMI) 40.0 and over, adult: Secondary | ICD-10-CM | POA: Diagnosis not present

## 2018-01-06 DIAGNOSIS — I10 Essential (primary) hypertension: Secondary | ICD-10-CM | POA: Diagnosis not present

## 2018-01-06 DIAGNOSIS — E785 Hyperlipidemia, unspecified: Secondary | ICD-10-CM | POA: Diagnosis not present

## 2018-01-06 DIAGNOSIS — Z79899 Other long term (current) drug therapy: Secondary | ICD-10-CM | POA: Diagnosis not present

## 2018-01-06 DIAGNOSIS — I5032 Chronic diastolic (congestive) heart failure: Secondary | ICD-10-CM | POA: Diagnosis not present

## 2018-01-06 DIAGNOSIS — E1165 Type 2 diabetes mellitus with hyperglycemia: Secondary | ICD-10-CM | POA: Diagnosis not present

## 2018-01-15 DIAGNOSIS — E876 Hypokalemia: Secondary | ICD-10-CM | POA: Diagnosis not present

## 2018-01-22 DIAGNOSIS — E876 Hypokalemia: Secondary | ICD-10-CM | POA: Diagnosis not present

## 2018-01-25 DIAGNOSIS — I2602 Saddle embolus of pulmonary artery with acute cor pulmonale: Secondary | ICD-10-CM | POA: Diagnosis not present

## 2018-01-25 DIAGNOSIS — I480 Paroxysmal atrial fibrillation: Secondary | ICD-10-CM | POA: Diagnosis not present

## 2018-01-25 DIAGNOSIS — I82401 Acute embolism and thrombosis of unspecified deep veins of right lower extremity: Secondary | ICD-10-CM | POA: Diagnosis not present

## 2018-01-25 DIAGNOSIS — I5032 Chronic diastolic (congestive) heart failure: Secondary | ICD-10-CM | POA: Diagnosis not present

## 2018-01-26 DIAGNOSIS — L03116 Cellulitis of left lower limb: Secondary | ICD-10-CM | POA: Diagnosis not present

## 2018-02-23 DIAGNOSIS — E876 Hypokalemia: Secondary | ICD-10-CM | POA: Diagnosis not present

## 2018-02-25 DIAGNOSIS — I5032 Chronic diastolic (congestive) heart failure: Secondary | ICD-10-CM | POA: Diagnosis not present

## 2018-02-25 DIAGNOSIS — I82401 Acute embolism and thrombosis of unspecified deep veins of right lower extremity: Secondary | ICD-10-CM | POA: Diagnosis not present

## 2018-02-25 DIAGNOSIS — I480 Paroxysmal atrial fibrillation: Secondary | ICD-10-CM | POA: Diagnosis not present

## 2018-02-25 DIAGNOSIS — I2602 Saddle embolus of pulmonary artery with acute cor pulmonale: Secondary | ICD-10-CM | POA: Diagnosis not present

## 2018-03-27 DIAGNOSIS — I82401 Acute embolism and thrombosis of unspecified deep veins of right lower extremity: Secondary | ICD-10-CM | POA: Diagnosis not present

## 2018-03-27 DIAGNOSIS — I2602 Saddle embolus of pulmonary artery with acute cor pulmonale: Secondary | ICD-10-CM | POA: Diagnosis not present

## 2018-03-27 DIAGNOSIS — I5032 Chronic diastolic (congestive) heart failure: Secondary | ICD-10-CM | POA: Diagnosis not present

## 2018-03-27 DIAGNOSIS — I480 Paroxysmal atrial fibrillation: Secondary | ICD-10-CM | POA: Diagnosis not present

## 2018-04-07 DIAGNOSIS — E1165 Type 2 diabetes mellitus with hyperglycemia: Secondary | ICD-10-CM | POA: Diagnosis not present

## 2018-04-07 DIAGNOSIS — D62 Acute posthemorrhagic anemia: Secondary | ICD-10-CM | POA: Diagnosis not present

## 2018-04-13 DIAGNOSIS — L03116 Cellulitis of left lower limb: Secondary | ICD-10-CM | POA: Diagnosis not present

## 2018-05-11 NOTE — Progress Notes (Signed)
Cardiology Office Note:    Date:  05/11/2018   ID:  Jennifer Medina, DOB [birth year], MRN [id number]  PCP:  Nicoletta Dress, MD  Cardiologist:  Shirlee More, MD    Referring MD: Nicoletta Dress, MD    ASSESSMENT:    1. Paroxysmal atrial fibrillation (HCC)   2. Chronic anticoagulation   3. Pulmonary artery hypertension ([place])   4. Chronic diastolic heart failure ([place])   5. Non-rheumatic tricuspid valve insufficiency    PLAN:    In order of problems listed above:  1. Stable no clinical recurrence continue beta-blocker anticoagulant 2. Stable continue her anticoagulant she relates that her CBC is normalized 3. Stable she has chronic lower extremity venous insufficiency and remains on long-term anticoagulation with pulmonary embolism 4. Appears compensated strongly encouraged her to weigh daily she talked about decreasing the dose of her diuretic and with frequent labs to be performed for renal function I would not.  With high-dose diuretic and previous trouble with renal insufficiency I do a BMP at least every 6 weeks 5. Stable   Next appointment: 6 months   Medication Adjustments/Labs and Tests Ordered: Current medicines are reviewed at length with the patient today.  Concerns regarding medicines are outlined above.  No orders of the defined types were placed in this encounter.  No orders of the defined types were placed in this encounter.   No chief complaint on file.   History of Present Illness:    Jennifer Medina is a 83 y.o. female with a hx of paroxysmal atrial fibrillation, DVT and massive PE 01/15/17 with catheter guided lytic therapy and anticoagulation, PAH and moderate to severe TR , diastolic heart failure and GI bleed with anemia last seen 10/24/17.  Compliance with diet, lifestyle and medications: No she does not do daily weight  She has chronic severe lower extremity edema with stasis changes and has had cellulitis and 2 courses of antibiotics.  She  restrict sodium takes her diuretic has frequent lab work done between every 1 to 2 weeks with her PCP for renal function potassium and fortunately at this time she is not short of breath no orthopnea or PND.  She has had no palpitations syncope or TIA and remains anticoagulated. Past Medical History:  Diagnosis Date  . Acute massive pulmonary embolism ([place])    Pt admitted 01/15/17 with submassive pulmonary embolism. She was treated conservatively secondary to a history of ICH in 2009.   Marland Kitchen Acute pulmonary embolism ([place]) 01/15/2017  . AKI (acute kidney injury) ([place]) 01/20/2017  . Atrial fibrillation with RVR ([place])   . CHF (congestive heart failure) ([place])   . Chronic anticoagulation 02/06/2017  . DVT (deep venous thrombosis) ([place]) 02/06/2017   Rt LE DVT  . Elevated troponin   . GI bleed   . Hemoptysis 01/20/2017  . Hyperglycemia 01/15/2017  . ICH (intracerebral hemorrhage) ([place]) 2009  . Morbid obesity ([place]) 02/06/2017  . Pulmonary embolism ([place]) 01/15/2017   saddle embolism  . Saddle embolus of pulmonary artery with acute cor pulmonale (HCC)   . Severe right ventricular systolic dysfunction ([place])   . SOB (shortness of breath)   . Thrombocytopenia ([place]) 01/20/2017  . Transaminitis 01/20/2017  . Wide-complex tachycardia ([place])     Past Surgical History:  Procedure Laterality Date  . APPENDECTOMY    . CHOLECYSTECTOMY    . IR GENERIC HISTORICAL  01/19/2017   IR THROMB F/U EVAL ART/VEN FINAL DAY (MS) 01/19/2017 Arne Cleveland, MD [facility]  .  IR GENERIC HISTORICAL  01/18/2017   IR ANGIOGRAM PULMONARY BILATERAL SELECTIVE 01/18/2017 Arne Cleveland, MD [facility]  . IR GENERIC HISTORICAL  01/18/2017   IR ANGIOGRAM SELECTIVE EACH ADDITIONAL VESSEL 01/18/2017 Arne Cleveland, MD [facility]  . IR GENERIC HISTORICAL  01/18/2017   IR INFUSION THROMBOL ARTERIAL INITIAL (MS) 01/18/2017 Arne Cleveland, MD [facility]  . IR GENERIC HISTORICAL  01/18/2017   IR ANGIOGRAM SELECTIVE EACH ADDITIONAL VESSEL  01/18/2017 Arne Cleveland, MD [facility]  . IR GENERIC HISTORICAL  01/18/2017   IR US GUIDE VASC ACCESS RIGHT 01/18/2017 Arne Cleveland, MD [facility]  . IR GENERIC HISTORICAL  01/18/2017   IR INFUSION THROMBOL ARTERIAL INITIAL (MS) 01/18/2017 Arne Cleveland, MD [facility]  . KNEE SURGERY    . rotator cuff surgery      Current Medications: No outpatient medications have been marked as taking for the 05/13/18 encounter (Appointment) with Richardo Priest, MD.     Allergies:   Aspirin; Nsaids; Penicillins; Simvastatin; and Vancomycin   Social History   Socioeconomic History  . Marital status: Widowed    Spouse name: Not on file  . Number of children: Not on file  . Years of education: Not on file  . Highest education level: Not on file  Occupational History  . Not on file  Social Needs  . Financial resource strain: Not on file  . Food insecurity:    Worry: Not on file    Inability: Not on file  . Transportation needs:    Medical: Not on file    Non-medical: Not on file  Tobacco Use  . Smoking status: Never Smoker  . Smokeless tobacco: Never Used  Substance and Sexual Activity  . Alcohol use: No  . Drug use: No  . Sexual activity: Not on file  Lifestyle  . Physical activity:    Days per week: Not on file    Minutes per session: Not on file  . Stress: Not on file  Relationships  . Social connections:    Talks on phone: Not on file    Gets together: Not on file    Attends religious service: Not on file    Active member of club or organization: Not on file    Attends meetings of clubs or organizations: Not on file    Relationship status: Not on file  Other Topics Concern  . Not on file  Social History Narrative  . Not on file     Family History: The patient's family history includes CAD in her father; Hypertension in her mother; Stroke in her mother. ROS:   Please see the history of present illness.    All other systems reviewed and are  negative.  EKGs/Labs/Other Studies Reviewed:    The following studies were reviewed today Recent labs requested from her PCP office  Recent Labs: No results found for requested labs within last 8760 hours.  Recent Lipid Panel No results found for: CHOL, TRIG, HDL, CHOLHDL, VLDL, LDLCALC, LDLDIRECT  Physical Exam:    VS:  There were no vitals taken for this visit.    Wt Readings from Last 3 Encounters:  10/14/17 251 lb 1.9 oz (113.9 kg)  08/04/17 247 lb 6.4 oz (112.2 kg)  03/14/17 224 lb (101.6 kg)     GEN: Marked obesity marked lower extremity edema not tense with stasis changes bilaterally well nourished, well developed in no acute distress HEENT: Normal NECK: No JVD; No carotid bruits LYMPHATICS: No lymphadenopathy CARDIAC: RRR, no  murmurs, rubs, gallops RESPIRATORY:  Clear to auscultation without rales, wheezing or rhonchi  ABDOMEN: Soft, non-tender, non-distended MUSCULOSKELETAL:   No deformity  SKIN: Warm and dry NEUROLOGIC:  Alert and oriented x 3 PSYCHIATRIC:  Normal affect    Signed, Shirlee More, MD  05/11/2018 7:23 PM    [place]

## 2018-05-13 ENCOUNTER — Encounter: Payer: Self-pay | Admitting: Cardiology

## 2018-05-13 ENCOUNTER — Ambulatory Visit (INDEPENDENT_AMBULATORY_CARE_PROVIDER_SITE_OTHER): Payer: Medicare PPO | Admitting: Cardiology

## 2018-05-13 VITALS — BP 140/62 | HR 63 | Ht 65.0 in | Wt 260.0 lb

## 2018-05-13 DIAGNOSIS — I480 Paroxysmal atrial fibrillation: Secondary | ICD-10-CM

## 2018-05-13 DIAGNOSIS — I5032 Chronic diastolic (congestive) heart failure: Secondary | ICD-10-CM | POA: Diagnosis not present

## 2018-05-13 DIAGNOSIS — I2721 Secondary pulmonary arterial hypertension: Secondary | ICD-10-CM

## 2018-05-13 DIAGNOSIS — I361 Nonrheumatic tricuspid (valve) insufficiency: Secondary | ICD-10-CM

## 2018-05-13 DIAGNOSIS — Z7901 Long term (current) use of anticoagulants: Secondary | ICD-10-CM | POA: Diagnosis not present

## 2018-05-13 NOTE — Patient Instructions (Addendum)
Medication Instructions:  Your physician recommends that you continue on your current medications as directed. Please refer to the Current Medication list given to you today.   Labwork: NONE  Testing/Procedures: NONE  Follow-Up: Your physician wants you to follow-up in: 6 months.  You will receive a reminder letter in the mail two months in advance. If you don't receive a letter, please call our office to schedule the follow-up appointment.   Any Other Special Instructions Will Be Listed Below (If Applicable).     If you need a refill on your cardiac medications before your next appointment, please call your pharmacy.     Eucerin cream to legs once a day toes to knees

## 2018-06-19 ENCOUNTER — Inpatient Hospital Stay (HOSPITAL_COMMUNITY)
Admission: AD | Admit: 2018-06-19 | Discharge: 2018-06-22 | DRG: 378 | Disposition: A | Discharge status: Home Health | Payer: Medicare PPO | Source: Other Acute Inpatient Hospital | Attending: Internal Medicine | Admitting: Internal Medicine

## 2018-06-19 ENCOUNTER — Other Ambulatory Visit: Payer: Self-pay

## 2018-06-19 ENCOUNTER — Encounter (HOSPITAL_COMMUNITY): Payer: Self-pay | Admitting: Internal Medicine

## 2018-06-19 DIAGNOSIS — Z86718 Personal history of other venous thrombosis and embolism: Secondary | ICD-10-CM | POA: Diagnosis not present

## 2018-06-19 DIAGNOSIS — D62 Acute posthemorrhagic anemia: Secondary | ICD-10-CM | POA: Diagnosis present

## 2018-06-19 DIAGNOSIS — D649 Anemia, unspecified: Secondary | ICD-10-CM | POA: Diagnosis not present

## 2018-06-19 DIAGNOSIS — N390 Urinary tract infection, site not specified: Secondary | ICD-10-CM | POA: Diagnosis present

## 2018-06-19 DIAGNOSIS — I509 Heart failure, unspecified: Secondary | ICD-10-CM | POA: Diagnosis not present

## 2018-06-19 DIAGNOSIS — K5791 Diverticulosis of intestine, part unspecified, without perforation or abscess with bleeding: Principal | ICD-10-CM | POA: Diagnosis present

## 2018-06-19 DIAGNOSIS — Z888 Allergy status to other drugs, medicaments and biological substances status: Secondary | ICD-10-CM

## 2018-06-19 DIAGNOSIS — E119 Type 2 diabetes mellitus without complications: Secondary | ICD-10-CM | POA: Diagnosis present

## 2018-06-19 DIAGNOSIS — I480 Paroxysmal atrial fibrillation: Secondary | ICD-10-CM | POA: Diagnosis present

## 2018-06-19 DIAGNOSIS — Z6841 Body Mass Index (BMI) 40.0 and over, adult: Secondary | ICD-10-CM | POA: Diagnosis not present

## 2018-06-19 DIAGNOSIS — E785 Hyperlipidemia, unspecified: Secondary | ICD-10-CM | POA: Diagnosis present

## 2018-06-19 DIAGNOSIS — I071 Rheumatic tricuspid insufficiency: Secondary | ICD-10-CM | POA: Diagnosis not present

## 2018-06-19 DIAGNOSIS — E538 Deficiency of other specified B group vitamins: Secondary | ICD-10-CM | POA: Diagnosis present

## 2018-06-19 DIAGNOSIS — I5032 Chronic diastolic (congestive) heart failure: Secondary | ICD-10-CM | POA: Diagnosis present

## 2018-06-19 DIAGNOSIS — I252 Old myocardial infarction: Secondary | ICD-10-CM | POA: Diagnosis not present

## 2018-06-19 DIAGNOSIS — Z7901 Long term (current) use of anticoagulants: Secondary | ICD-10-CM | POA: Diagnosis not present

## 2018-06-19 DIAGNOSIS — Z7984 Long term (current) use of oral hypoglycemic drugs: Secondary | ICD-10-CM | POA: Diagnosis not present

## 2018-06-19 DIAGNOSIS — Z880 Allergy status to penicillin: Secondary | ICD-10-CM

## 2018-06-19 DIAGNOSIS — R531 Weakness: Secondary | ICD-10-CM | POA: Diagnosis not present

## 2018-06-19 DIAGNOSIS — Z86711 Personal history of pulmonary embolism: Secondary | ICD-10-CM

## 2018-06-19 DIAGNOSIS — K5790 Diverticulosis of intestine, part unspecified, without perforation or abscess without bleeding: Secondary | ICD-10-CM

## 2018-06-19 DIAGNOSIS — K625 Hemorrhage of anus and rectum: Secondary | ICD-10-CM | POA: Diagnosis not present

## 2018-06-19 DIAGNOSIS — R42 Dizziness and giddiness: Secondary | ICD-10-CM | POA: Diagnosis not present

## 2018-06-19 DIAGNOSIS — E611 Iron deficiency: Secondary | ICD-10-CM | POA: Diagnosis present

## 2018-06-19 DIAGNOSIS — Z79899 Other long term (current) drug therapy: Secondary | ICD-10-CM

## 2018-06-19 DIAGNOSIS — I361 Nonrheumatic tricuspid (valve) insufficiency: Secondary | ICD-10-CM | POA: Diagnosis not present

## 2018-06-19 DIAGNOSIS — K219 Gastro-esophageal reflux disease without esophagitis: Secondary | ICD-10-CM | POA: Diagnosis present

## 2018-06-19 DIAGNOSIS — N3000 Acute cystitis without hematuria: Secondary | ICD-10-CM | POA: Diagnosis not present

## 2018-06-19 DIAGNOSIS — Z886 Allergy status to analgesic agent status: Secondary | ICD-10-CM | POA: Diagnosis not present

## 2018-06-19 DIAGNOSIS — I110 Hypertensive heart disease with heart failure: Secondary | ICD-10-CM | POA: Diagnosis not present

## 2018-06-19 DIAGNOSIS — K922 Gastrointestinal hemorrhage, unspecified: Secondary | ICD-10-CM | POA: Diagnosis not present

## 2018-06-19 DIAGNOSIS — I2721 Secondary pulmonary arterial hypertension: Secondary | ICD-10-CM | POA: Diagnosis not present

## 2018-06-19 MED ORDER — SALINE SPRAY 0.65 % NA SOLN
1.0000 | NASAL | Status: DC | PRN
  Filled 2018-06-19: qty 44

## 2018-06-19 MED ORDER — LOPERAMIDE HCL 2 MG PO CAPS: 2.0000 mg | ORAL_CAPSULE | Freq: Four times a day (QID) | ORAL | Status: DC | PRN

## 2018-06-19 MED ORDER — METOPROLOL SUCCINATE ER 25 MG PO TB24
25.0000 mg | ORAL_TABLET | Freq: Every day | ORAL | Status: DC
  Administered 2018-06-19: 25 mg via ORAL
  Filled 2018-06-19: qty 1

## 2018-06-19 MED ORDER — POTASSIUM CHLORIDE CRYS ER 20 MEQ PO TBCR
20.0000 meq | EXTENDED_RELEASE_TABLET | Freq: Every day | ORAL | Status: DC
  Administered 2018-06-19: 20 meq via ORAL
  Filled 2018-06-19: qty 1

## 2018-06-19 MED ORDER — SODIUM CHLORIDE 0.9 % IV SOLN
8.0000 mg/h | INTRAVENOUS | Status: DC
  Administered 2018-06-19 – 2018-06-20 (×2): 8 mg/h via INTRAVENOUS
  Filled 2018-06-19 (×4): qty 80

## 2018-06-19 MED ORDER — ACETAMINOPHEN 325 MG PO TABS: 650.0000 mg | ORAL_TABLET | Freq: Four times a day (QID) | ORAL | Status: DC | PRN

## 2018-06-19 MED ORDER — LATANOPROST 0.005 % OP SOLN
1.0000 [drp] | Freq: Every day | OPHTHALMIC | Status: DC
  Administered 2018-06-19 – 2018-06-21 (×3): 1 [drp] via OPHTHALMIC
  Filled 2018-06-19: qty 2.5

## 2018-06-19 MED ORDER — ACETAMINOPHEN 650 MG RE SUPP: 650.0000 mg | Freq: Four times a day (QID) | RECTAL | Status: DC | PRN

## 2018-06-19 MED ORDER — FUROSEMIDE 80 MG PO TABS
80.0000 mg | ORAL_TABLET | Freq: Every day | ORAL | Status: DC
  Administered 2018-06-19: 80 mg via ORAL
  Filled 2018-06-19: qty 1

## 2018-06-19 MED ORDER — LEVOFLOXACIN 500 MG PO TABS
250.0000 mg | ORAL_TABLET | Freq: Every day | ORAL | Status: DC
  Administered 2018-06-19 – 2018-06-22 (×4): 250 mg via ORAL
  Filled 2018-06-19 (×4): qty 1

## 2018-06-19 MED ORDER — SODIUM CHLORIDE 0.9 % IV SOLN
INTRAVENOUS | Status: AC
  Administered 2018-06-19: 19:00:00 via INTRAVENOUS

## 2018-06-19 MED ORDER — TRAMADOL HCL 50 MG PO TABS: 50.0000 mg | ORAL_TABLET | Freq: Four times a day (QID) | ORAL | Status: DC | PRN

## 2018-06-19 MED ORDER — ATORVASTATIN CALCIUM 40 MG PO TABS
40.0000 mg | ORAL_TABLET | Freq: Every day | ORAL | Status: DC
  Administered 2018-06-19: 40 mg via ORAL
  Filled 2018-06-19: qty 1

## 2018-06-19 MED ORDER — GLIMEPIRIDE 4 MG PO TABS: 2.0000 mg | ORAL_TABLET | Freq: Every morning | ORAL | Status: DC

## 2018-06-19 MED ORDER — LORAZEPAM 0.5 MG PO TABS: 0.5000 mg | ORAL_TABLET | Freq: Three times a day (TID) | ORAL | Status: DC | PRN

## 2018-06-19 NOTE — H&P (Signed)
TRH H&P   Patient Demographics:    Jennifer Medina, is a 83 y.o. female  MRN: [id number]   DOB - 09/29/1935  Admit Date - 06/19/2018  Outpatient Primary MD for the patient is Nicoletta Dress, MD  Referring MD/NP/PA:   ?Carolynne Edouard  Outpatient Specialists:   Patient coming from: home  No chief complaint on file.  Rectal bleeding   HPI:    Jennifer Medina  is a 83 y.o. female, w h/o DVT/ PE, Pafib, CHF, GI bleeding, ICH, apparently presents with c/o rectal bleeding this am, both on toilet paper and in toilet bowel.  Pt denies fever, chills, cp, palp, sob, n/v, hematemesis, abd pain, black stool.  Pt went to [facility] ER and was transferred to [facility] due to lack of GI services there.  Dr. Lyndel Safe was the GI who was willing to have the patient transferred.   In ED at [facility] 98.2, P 64, R 20,  Bp 115/63  Pox 97% on RA  Wbc 6.3, Hgb 9.4, Plt 272 INR 1.2, PTT 36.3 Na 138, K 3.5, Bun 22, Creatinine 0.8 Ast 22, Alt 13, Alk phos 129  Urinalysis wbc 20-30, rbc 5-10   CXR  No acute cardiopulmonary process.    EKG nsr at 65, nl axis, poor R progression,   Pt will be admitted for rectal bleeding and UTI.       Review of systems:    In addition to the HPI above,  No Fever-chills, No Headache, No changes with Vision or hearing, No problems swallowing food or Liquids, No Chest pain, Cough or Shortness of Breath,  No Blood in   Urine, No dysuria, No new skin rashes or bruises, No new joints pains-aches,  No new weakness, tingling, numbness in any extremity, No recent weight gain or loss, No polyuria, polydypsia or polyphagia, No significant Mental Stressors.  A full 10 point Review of Systems was done, except as stated above, all other Review of Systems were negative.   With Past History of the following :    Past Medical History:  Diagnosis Date  . Acute massive  pulmonary embolism ([place])    Pt admitted 01/15/17 with submassive pulmonary embolism. She was treated conservatively secondary to a history of ICH in 2009.   Marland Kitchen Acute pulmonary embolism ([place]) 01/15/2017  . AKI (acute kidney injury) ([place]) 01/20/2017  . Atrial fibrillation with RVR ([place])   . CHF (congestive heart failure) ([place])   . Chronic anticoagulation 02/06/2017  . DVT (deep venous thrombosis) ([place]) 02/06/2017   Rt LE DVT  . Elevated troponin   . GI bleed   . Hemoptysis 01/20/2017  . Hyperglycemia 01/15/2017  . ICH (intracerebral hemorrhage) ([place]) 2009  . Morbid obesity ([place]) 02/06/2017  . Pulmonary embolism ([place]) 01/15/2017   saddle embolism  . Saddle embolus of pulmonary artery with acute cor pulmonale (HCC)   .  Severe right ventricular systolic dysfunction ([place])   . SOB (shortness of breath)   . Thrombocytopenia ([place]) 01/20/2017  . Transaminitis 01/20/2017  . Wide-complex tachycardia ([place])       Past Surgical History:  Procedure Laterality Date  . APPENDECTOMY    . CHOLECYSTECTOMY    . IR GENERIC HISTORICAL  01/19/2017   IR THROMB F/U EVAL ART/VEN FINAL DAY (MS) 01/19/2017 Arne Cleveland, MD [facility]  . IR GENERIC HISTORICAL  01/18/2017   IR ANGIOGRAM PULMONARY BILATERAL SELECTIVE 01/18/2017 Arne Cleveland, MD [facility]  . IR GENERIC HISTORICAL  01/18/2017   IR ANGIOGRAM SELECTIVE EACH ADDITIONAL VESSEL 01/18/2017 Arne Cleveland, MD [facility]  . IR GENERIC HISTORICAL  01/18/2017   IR INFUSION THROMBOL ARTERIAL INITIAL (MS) 01/18/2017 Arne Cleveland, MD [facility]  . IR GENERIC HISTORICAL  01/18/2017   IR ANGIOGRAM SELECTIVE EACH ADDITIONAL VESSEL 01/18/2017 Arne Cleveland, MD [facility]  . IR GENERIC HISTORICAL  01/18/2017   IR US GUIDE VASC ACCESS RIGHT 01/18/2017 Arne Cleveland, MD [facility]  . IR GENERIC HISTORICAL  01/18/2017   IR INFUSION THROMBOL ARTERIAL INITIAL (MS) 01/18/2017 Arne Cleveland, MD [facility]  . KNEE SURGERY    . rotator cuff surgery         Social History:     Social History   Tobacco Use  . Smoking status: Never Smoker  . Smokeless tobacco: Never Used  Substance Use Topics  . Alcohol use: No     Lives - at home  Mobility - walks by self   Family History :     Family History  Problem Relation Age of Onset  . CAD Father   . Hypertension Mother   . Stroke Mother        Home Medications:   Prior to Admission medications   Medication Sig Start Date End Date Taking? Authorizing Provider  acetaminophen (TYLENOL) 325 MG tablet Take 650 mg by mouth every 6 (six) hours as needed for moderate pain or headache.     [provider]  atorvastatin (LIPITOR) 40 MG tablet Take 40 mg by mouth at bedtime.    [provider]  Camphor (JOINTFLEX EX) Apply 1 application topically as needed (for pain).    [provider]  furosemide (LASIX) 40 MG tablet Take 2 tablets (80 mg total) by mouth daily. 10/14/17   Richardo Priest, MD  glimepiride (AMARYL) 2 MG tablet Take 2 mg by mouth every morning. 04/06/18   [provider]  guaiFENesin (MUCINEX) 600 MG 12 hr tablet Take 600 mg by mouth as directed.    [provider]  latanoprost (XALATAN) 0.005 % ophthalmic solution Place 1 drop into both eyes at bedtime.    [provider]  loperamide (IMODIUM A-D) 2 MG tablet Take 2 mg by mouth 4 (four) times daily as needed for diarrhea or loose stools.    [provider]  LORazepam (ATIVAN) 0.5 MG tablet Take 1 tablet (0.5 mg total) by mouth every 8 (eight) hours as needed for anxiety. 01/23/17   Ghimire, Henreitta Leber, MD  metoprolol succinate (TOPROL-XL) 25 MG 24 hr tablet Take 25 mg by mouth daily.    [provider]  nitroGLYCERIN (NITROSTAT) 0.4 MG SL tablet Place 0.4 mg under the tongue every 5 (five) minutes as needed for chest pain.    [provider]  Omeprazole 20 MG TBEC Take 20 mg by mouth daily.     [provider]  potassium chloride SA  (  K-DUR,KLOR-CON) 20 MEQ tablet Take 20 mEq by mouth daily.    [provider]  rivaroxaban (XARELTO) 20 MG TABS tablet Take 1 tablet (20 mg total) by mouth daily with supper. 02/20/17   Parrett, Fonnie Mu, NP  sodium chloride (OCEAN) 0.65 % SOLN nasal spray Place 1 spray into both nostrils as needed for congestion. Please use 10 times a day 01/23/17   Jonetta Osgood, MD  sulfamethoxazole-trimethoprim (BACTRIM DS,SEPTRA DS) 800-160 MG tablet Take 1 tablet by mouth 2 (two) times daily. 05/04/18   [provider]  traMADol (ULTRAM) 50 MG tablet Take 1 tablet (50 mg total) by mouth every 6 (six) hours as needed for moderate pain. 01/23/17   Ghimire, Henreitta Leber, MD     Allergies:     Allergies  Allergen Reactions  . Aspirin Other (See Comments)    GI bleed  . Nsaids Other (See Comments)    GI bleed  . Penicillins Shortness Of Breath and Rash  . Simvastatin Nausea Only  . Vancomycin Rash     Physical Exam:   Vitals  Blood pressure 139/68, pulse 67, temperature 97.8 F (36.6 C), temperature source Oral, resp. rate 18, height 5' 5"  (1.651 m), weight 123.3 kg, SpO2 98 %.   1. General  lying in bed in NAD,    2. Normal affect and insight, Not Suicidal or Homicidal, Awake Alert, Oriented X 3.  3. No F.N deficits, ALL C.Nerves Intact, Strength 5/5 all 4 extremities, Sensation intact all 4 extremities, Plantars down going.  4. Ears and Eyes appear Normal, Conjunctivae clear, PERRLA. Moist Oral Mucosa.  5. Supple Neck, No JVD, No cervical lymphadenopathy appriciated, No Carotid Bruits.  6. Symmetrical Chest wall movement, Good air movement bilaterally, CTAB.  7. RRR, No Gallops, Rubs or Murmurs, No Parasternal Heave.  8. Positive Bowel Sounds, Abdomen Soft, No tenderness, No organomegaly appriciated,No rebound -guarding or rigidity.  9.  No Cyanosis, Normal Skin Turgor, No Skin Rash or Bruise.  10. Good muscle tone,  joints appear normal , no effusions, Normal  ROM.  11. No Palpable Lymph Nodes in Neck or Axillae      Data Review:    CBC No results for input(s): WBC, HGB, HCT, PLT, MCV, MCH, MCHC, RDW, LYMPHSABS, MONOABS, EOSABS, BASOSABS, BANDABS in the last 168 hours.  Invalid input(s): NEUTRABS, BANDSABD ------------------------------------------------------------------------------------------------------------------  Chemistries  No results for input(s): NA, K, CL, CO2, GLUCOSE, BUN, CREATININE, CALCIUM, MG, AST, ALT, ALKPHOS, BILITOT in the last 168 hours.  Invalid input(s): GFRCGP ------------------------------------------------------------------------------------------------------------------ CrCl cannot be calculated (Patient's most recent lab result is older than the maximum 21 days allowed.). ------------------------------------------------------------------------------------------------------------------ No results for input(s): TSH, T4TOTAL, T3FREE, THYROIDAB in the last 72 hours.  Invalid input(s): FREET3  Coagulation profile No results for input(s): INR, PROTIME in the last 168 hours. ------------------------------------------------------------------------------------------------------------------- No results for input(s): DDIMER in the last 72 hours. -------------------------------------------------------------------------------------------------------------------  Cardiac Enzymes No results for input(s): CKMB, TROPONINI, MYOGLOBIN in the last 168 hours.  Invalid input(s): CK ------------------------------------------------------------------------------------------------------------------    Component Value Date/Time   BNP 841.8 (H) 01/15/2017 2338     ---------------------------------------------------------------------------------------------------------------  Urinalysis    Component Value Date/Time   COLORURINE AMBER (A) 01/16/2017 0303   APPEARANCEUR HAZY (A) 01/16/2017 0303   LABSPEC >1.046 (H)  01/16/2017 0303   PHURINE 5.0 01/16/2017 0303   GLUCOSEU NEGATIVE 01/16/2017 0303   HGBUR LARGE (A) 01/16/2017 0303   BILIRUBINUR NEGATIVE 01/16/2017 0303   KETONESUR NEGATIVE 01/16/2017 0303   PROTEINUR 100 (A) 01/16/2017  0303   NITRITE NEGATIVE 01/16/2017 0303   LEUKOCYTESUR NEGATIVE 01/16/2017 0303    ----------------------------------------------------------------------------------------------------------------   Imaging Results:    No results found.     Assessment & Plan:    Active Problems:   Rectal bleeding    Rectal Bleeding Type and screen NPO  Ns iv STOP Xarelto GI consulted by ED, appreciate input Cbc in am  Pafib STOP Xarelto Cont Toprol XL 62m po qday  CHF Cont Toprol XL as above Cont Lasix 861mpo qday Check cmp in am  Gerd Cont PPI  DM2 HOLD Amaryl Fsbs q4h, ISS  Hyperlipidemia Cont Lipitor 4059mo qhs  H/o UTI Levaquin IV pharmacy to dose     DVT Prophylaxis  SCDs   AM Labs Ordered, also please review Full Orders  Family Communication: Admission, patients condition and plan of care including tests being ordered have been discussed with the patient who indicate understanding and agree with the plan and Code Status.  Code Status  FULL CODE  Likely DC to  home  Condition GUARDED  Consults called:  GI by ED, please call Dr. GupLyndel Safe AM  Admission status:  inpatient  Time spent in minutes : 70   JamJani GravelD on 06/19/2018 at 9:08 PM  Between 7am to 7pm - Pager - [phone] After 7pm go to [email or web address] - password [email or web address] - Office  [phone]

## 2018-06-20 DIAGNOSIS — K625 Hemorrhage of anus and rectum: Secondary | ICD-10-CM

## 2018-06-20 DIAGNOSIS — K922 Gastrointestinal hemorrhage, unspecified: Secondary | ICD-10-CM

## 2018-06-20 DIAGNOSIS — I2721 Secondary pulmonary arterial hypertension: Secondary | ICD-10-CM

## 2018-06-20 DIAGNOSIS — I361 Nonrheumatic tricuspid (valve) insufficiency: Secondary | ICD-10-CM

## 2018-06-20 DIAGNOSIS — I480 Paroxysmal atrial fibrillation: Secondary | ICD-10-CM

## 2018-06-20 DIAGNOSIS — D649 Anemia, unspecified: Secondary | ICD-10-CM

## 2018-06-20 LAB — COMPREHENSIVE METABOLIC PANEL
ALK PHOS: 88 U/L (ref 38–126)
ALT: 11 U/L (ref 0–44)
AST: 15 U/L (ref 15–41)
Albumin: 2.8 g/dL — ABNORMAL LOW (ref 3.5–5.0)
Anion gap: 7 (ref 5–15)
BUN: 13 mg/dL (ref 8–23)
CALCIUM: 8.7 mg/dL — AB (ref 8.9–10.3)
CO2: 31 mmol/L (ref 22–32)
CREATININE: 0.9 mg/dL (ref 0.44–1.00)
Chloride: 104 mmol/L (ref 98–111)
GFR, EST NON AFRICAN AMERICAN: 58 mL/min — AB (ref >60)
Glucose, Bld: 96 mg/dL (ref 70–99)
Potassium: 3.7 mmol/L (ref 3.5–5.1)
SODIUM: 142 mmol/L (ref 135–145)
Total Bilirubin: 0.9 mg/dL (ref 0.3–1.2)
Total Protein: 5.6 g/dL — ABNORMAL LOW (ref 6.5–8.1)

## 2018-06-20 LAB — BASIC METABOLIC PANEL
ANION GAP: 10 (ref 5–15)
BUN: 13 mg/dL (ref 8–23)
CALCIUM: 8.9 mg/dL (ref 8.9–10.3)
CO2: 31 mmol/L (ref 22–32)
CREATININE: 0.99 mg/dL (ref 0.44–1.00)
Chloride: 101 mmol/L (ref 98–111)
GFR, EST NON AFRICAN AMERICAN: 52 mL/min — AB (ref >60)
Glucose, Bld: 100 mg/dL — ABNORMAL HIGH (ref 70–99)
Potassium: 3.4 mmol/L — ABNORMAL LOW (ref 3.5–5.1)
SODIUM: 142 mmol/L (ref 135–145)

## 2018-06-20 LAB — IRON AND TIBC
IRON: 24 ug/dL — AB (ref 28–170)
Saturation Ratios: 6 % — ABNORMAL LOW (ref 10.4–31.8)
TIBC: 410 ug/dL (ref 250–450)
UIBC: 386 ug/dL

## 2018-06-20 LAB — CBC
HCT: 29 % — ABNORMAL LOW (ref 36.0–46.0)
HCT: 29.1 % — ABNORMAL LOW (ref 36.0–46.0)
HEMATOCRIT: 30.3 % — AB (ref 36.0–46.0)
Hemoglobin: 8.4 g/dL — ABNORMAL LOW (ref 12.0–15.0)
Hemoglobin: 8.8 g/dL — ABNORMAL LOW (ref 12.0–15.0)
Hemoglobin: 8.3 g/dL — ABNORMAL LOW (ref 12.0–15.0)
MCH: 23.3 pg — AB (ref 26.0–34.0)
MCH: 23.4 pg — ABNORMAL LOW (ref 26.0–34.0)
MCH: 23 pg — ABNORMAL LOW (ref 26.0–34.0)
MCHC: 29 g/dL — ABNORMAL LOW (ref 30.0–36.0)
MCHC: 29 g/dL — ABNORMAL LOW (ref 30.0–36.0)
MCHC: 28.5 g/dL — ABNORMAL LOW (ref 30.0–36.0)
MCV: 80.6 fL (ref 78.0–100.0)
MCV: 80.6 fL (ref 78.0–100.0)
MCV: 80.6 fL (ref 78.0–100.0)
PLATELETS: 257 10*3/uL (ref 150–400)
PLATELETS: 253 10*3/uL (ref 150–400)
Platelets: 257 10*3/uL (ref 150–400)
RBC: 3.6 MIL/uL — AB (ref 3.87–5.11)
RBC: 3.76 MIL/uL — ABNORMAL LOW (ref 3.87–5.11)
RBC: 3.61 MIL/uL — ABNORMAL LOW (ref 3.87–5.11)
RDW: 16.5 % — AB (ref 11.5–15.5)
RDW: 16.5 % — AB (ref 11.5–15.5)
RDW: 16.6 % — AB (ref 11.5–15.5)
WBC: 6.2 10*3/uL (ref 4.0–10.5)
WBC: 7.1 10*3/uL (ref 4.0–10.5)
WBC: 6.8 10*3/uL (ref 4.0–10.5)

## 2018-06-20 LAB — VITAMIN B12: VITAMIN B 12: 151 pg/mL — AB (ref 180–914)

## 2018-06-20 LAB — GLUCOSE, CAPILLARY
GLUCOSE-CAPILLARY: 78 mg/dL (ref 70–99)
GLUCOSE-CAPILLARY: 122 mg/dL — AB (ref 70–99)
Glucose-Capillary: 108 mg/dL — ABNORMAL HIGH (ref 70–99)

## 2018-06-20 LAB — RETICULOCYTES
RBC.: 3.76 MIL/uL — ABNORMAL LOW (ref 3.87–5.11)
RETIC CT PCT: 1.8 % (ref 0.4–3.1)
Retic Count, Absolute: 67.7 10*3/uL (ref 19.0–186.0)

## 2018-06-20 LAB — FERRITIN: Ferritin: 7 ng/mL — ABNORMAL LOW (ref 11–307)

## 2018-06-20 LAB — FOLATE: FOLATE: 22.1 ng/mL (ref >5.9)

## 2018-06-20 MED ORDER — INSULIN ASPART 100 UNIT/ML ~~LOC~~ SOLN
0.0000 [IU] | Freq: Three times a day (TID) | SUBCUTANEOUS | Status: DC
  Administered 2018-06-21 (×2): 1 [IU] via SUBCUTANEOUS

## 2018-06-20 MED ORDER — METOPROLOL SUCCINATE ER 25 MG PO TB24
12.5000 mg | ORAL_TABLET | Freq: Every day | ORAL | Status: DC
  Administered 2018-06-20 – 2018-06-22 (×3): 12.5 mg via ORAL
  Filled 2018-06-20 (×3): qty 1

## 2018-06-20 MED ORDER — SODIUM CHLORIDE 0.9% IV SOLUTION
Freq: Once | INTRAVENOUS | Status: AC
  Administered 2018-06-21: 15:00:00 via INTRAVENOUS

## 2018-06-20 MED ORDER — INSULIN ASPART 100 UNIT/ML ~~LOC~~ SOLN: 0.0000 [IU] | Freq: Every day | SUBCUTANEOUS | Status: DC

## 2018-06-20 NOTE — Consult Note (Signed)
Referring Provider: [facility]  Primary Care Physician:  Nicoletta Dress, MD Primary Gastroenterologist:  Dr. Melina Copa  MD  Reason for Consultation:   Rectal bleeding    ASSESSMENT AND PLAN:   Painless hematochezia- highly suggestive of diverticular bleed while on Xeralto for DVT/PE/A fib (last dose 8/6). Negative CT[facility] at [facility], Neg multiple colonoscopies, last colonoscopy Dr. Odie Sera in 2018 when she presented with similar problems.  Plan: - Hold Xeralto. -Trend CBC. - Managed conservatively for now. Hold off on colonoscopy as it was recently done, unless active continued bleeding.      HPI: Jennifer Medina is a 83 y.o. female  History of DVT with PE, A Fib, ICH transferred from [facility] with painless hematochezia. Has been on Xeralto. Had 3 episodes last night to Hb 8.4. No nausea, vomiting, heartburn, regurgitation, odynophagia or dysphagia.  No significant diarrhea or constipation.  There is no melena. No unintentional weight loss.   At similar episode last year and underwent colonoscopy by Dr. Odie Sera. She had several colonoscopies previously by Dr. Melina Copa to be negative except for diverticulosis.  Past Medical History:  Diagnosis Date  . Acute massive pulmonary embolism ([place])    Pt admitted 01/15/17 with submassive pulmonary embolism. She was treated conservatively secondary to a history of ICH in 2009.   Marland Kitchen Acute pulmonary embolism ([place]) 01/15/2017  . AKI (acute kidney injury) ([place]) 01/20/2017  . Atrial fibrillation with RVR ([place])   . CHF (congestive heart failure) ([place])   . Chronic anticoagulation 02/06/2017  . DVT (deep venous thrombosis) ([place]) 02/06/2017   Rt LE DVT  . Elevated troponin   . GI bleed   . Hemoptysis 01/20/2017  . Hyperglycemia 01/15/2017  . ICH (intracerebral hemorrhage) ([place]) 2009  . Morbid obesity ([place]) 02/06/2017  . Pulmonary embolism ([place]) 01/15/2017   saddle embolism  . Saddle embolus of pulmonary artery with acute cor  pulmonale (HCC)   . Severe right ventricular systolic dysfunction ([place])   . SOB (shortness of breath)   . Thrombocytopenia ([place]) 01/20/2017  . Transaminitis 01/20/2017  . Wide-complex tachycardia ([place])     Past Surgical History:  Procedure Laterality Date  . APPENDECTOMY    . CHOLECYSTECTOMY    . IR GENERIC HISTORICAL  01/19/2017   IR THROMB F/U EVAL ART/VEN FINAL DAY (MS) 01/19/2017 Arne Cleveland, MD [facility]  . IR GENERIC HISTORICAL  01/18/2017   IR ANGIOGRAM PULMONARY BILATERAL SELECTIVE 01/18/2017 Arne Cleveland, MD [facility]  . IR GENERIC HISTORICAL  01/18/2017   IR ANGIOGRAM SELECTIVE EACH ADDITIONAL VESSEL 01/18/2017 Arne Cleveland, MD [facility]  . IR GENERIC HISTORICAL  01/18/2017   IR INFUSION THROMBOL ARTERIAL INITIAL (MS) 01/18/2017 Arne Cleveland, MD [facility]  . IR GENERIC HISTORICAL  01/18/2017   IR ANGIOGRAM SELECTIVE EACH ADDITIONAL VESSEL 01/18/2017 Arne Cleveland, MD [facility]  . IR GENERIC HISTORICAL  01/18/2017   IR US GUIDE VASC ACCESS RIGHT 01/18/2017 Arne Cleveland, MD [facility]  . IR GENERIC HISTORICAL  01/18/2017   IR INFUSION THROMBOL ARTERIAL INITIAL (MS) 01/18/2017 Arne Cleveland, MD [facility]  . KNEE SURGERY    . rotator cuff surgery      Prior to Admission medications   Medication Sig Start Date End Date Taking? Authorizing Provider  acetaminophen (TYLENOL) 325 MG tablet Take 650 mg by mouth every 6 (six) hours as needed for moderate pain or headache.     [provider]  atorvastatin (LIPITOR) 40 MG tablet Take 40 mg by mouth  at bedtime.    [provider]  Camphor (JOINTFLEX EX) Apply 1 application topically as needed (for pain).    [provider]  furosemide (LASIX) 40 MG tablet Take 2 tablets (80 mg total) by mouth daily. 10/14/17   Richardo Priest, MD  glimepiride (AMARYL) 2 MG tablet Take 2 mg by mouth every morning. 04/06/18   [provider]  guaiFENesin (MUCINEX) 600 MG 12 hr tablet Take 600  mg by mouth as directed.    [provider]  latanoprost (XALATAN) 0.005 % ophthalmic solution Place 1 drop into both eyes at bedtime.    [provider]  loperamide (IMODIUM A-D) 2 MG tablet Take 2 mg by mouth 4 (four) times daily as needed for diarrhea or loose stools.    [provider]  LORazepam (ATIVAN) 0.5 MG tablet Take 1 tablet (0.5 mg total) by mouth every 8 (eight) hours as needed for anxiety. 01/23/17   Ghimire, Henreitta Leber, MD  metoprolol succinate (TOPROL-XL) 25 MG 24 hr tablet Take 25 mg by mouth daily.    [provider]  nitroGLYCERIN (NITROSTAT) 0.4 MG SL tablet Place 0.4 mg under the tongue every 5 (five) minutes as needed for chest pain.    [provider]  Omeprazole 20 MG TBEC Take 20 mg by mouth daily.     [provider]  potassium chloride SA (K-DUR,KLOR-CON) 20 MEQ tablet Take 20 mEq by mouth daily.    [provider]  rivaroxaban (XARELTO) 20 MG TABS tablet Take 1 tablet (20 mg total) by mouth daily with supper. 02/20/17   Parrett, Fonnie Mu, NP  sodium chloride (OCEAN) 0.65 % SOLN nasal spray Place 1 spray into both nostrils as needed for congestion. Please use 10 times a day 01/23/17   Jonetta Osgood, MD  sulfamethoxazole-trimethoprim (BACTRIM DS,SEPTRA DS) 800-160 MG tablet Take 1 tablet by mouth 2 (two) times daily. 05/04/18   [provider]  traMADol (ULTRAM) 50 MG tablet Take 1 tablet (50 mg total) by mouth every 6 (six) hours as needed for moderate pain. 01/23/17   Ghimire, Henreitta Leber, MD    Current Facility-Administered Medications  Medication Dose Route Frequency Provider Last Rate Last Dose  . 0.9 %  sodium chloride infusion (Manually program via Guardrails IV Fluids)   Intravenous Once Debbe Odea, MD      . acetaminophen (TYLENOL) tablet 650 mg  650 mg Oral Q6H PRN Jani Gravel, MD       Or  . acetaminophen (TYLENOL) suppository 650 mg  650 mg Rectal Q6H PRN Jani Gravel, MD      . insulin  aspart (novoLOG) injection 0-5 Units  0-5 Units Subcutaneous QHS Rizwan, Saima, MD      . insulin aspart (novoLOG) injection 0-9 Units  0-9 Units Subcutaneous TID WC Rizwan, Saima, MD      . latanoprost (XALATAN) 0.005 % ophthalmic solution 1 drop  1 drop Both Eyes QHS Jani Gravel, MD   1 drop at 06/19/18 2343  . levofloxacin (LEVAQUIN) tablet 250 mg  250 mg Oral Daily Jani Gravel, MD   250 mg at 06/19/18 2018  . metoprolol succinate (TOPROL-XL) 24 hr tablet 12.5 mg  12.5 mg Oral Daily Rizwan, Eunice Blase, MD      . pantoprazole (PROTONIX) 80 mg in sodium chloride 0.9 % 250 mL (0.32 mg/mL) infusion  8 mg/hr Intravenous Continuous Jani Gravel, MD 25 mL/hr at 06/20/18 0716 8 mg/hr at 06/20/18 0716  . sodium chloride (OCEAN)  0.65 % nasal spray 1 spray  1 spray Each Nare PRN Jani Gravel, MD        Allergies as of 06/19/2018 - Review Complete 06/19/2018  Allergen Reaction Noted  . Aspirin Other (See Comments) 01/15/2017  . Nsaids Other (See Comments) 01/15/2017  . Penicillins Shortness Of Breath and Rash 01/15/2017  . Simvastatin Nausea Only 01/15/2017  . Vancomycin Rash 01/15/2017    Family History  Problem Relation Age of Onset  . CAD Father   . Hypertension Mother   . Stroke Mother     Social History   Socioeconomic History  . Marital status: Widowed    Spouse name: Not on file  . Number of children: Not on file  . Years of education: Not on file  . Highest education level: Not on file  Occupational History  . Not on file  Social Needs  . Financial resource strain: Not on file  . Food insecurity:    Worry: Not on file    Inability: Not on file  . Transportation needs:    Medical: Not on file    Non-medical: Not on file  Tobacco Use  . Smoking status: Never Smoker  . Smokeless tobacco: Never Used  Substance and Sexual Activity  . Alcohol use: No  . Drug use: No  . Sexual activity: Not on file  Lifestyle  . Physical activity:    Days per week: Not on file    Minutes per  session: Not on file  . Stress: Not on file  Relationships  . Social connections:    Talks on phone: Not on file    Gets together: Not on file    Attends religious service: Not on file    Active member of club or organization: Not on file    Attends meetings of clubs or organizations: Not on file    Relationship status: Not on file  . Intimate partner violence:    Fear of current or ex partner: Not on file    Emotionally abused: Not on file    Physically abused: Not on file    Forced sexual activity: Not on file  Other Topics Concern  . Not on file  Social History Narrative  . Not on file    Review of Systems: All systems reviewed and negative except where noted in HPI.  Physical Exam: Vital signs in last 24 hours: Temp:  [97.8 F (36.6 C)-98.4 F (36.9 C)] 98.2 F (36.8 C) (08/10 0814) Pulse Rate:  [67-78] 69 (08/10 0814) Resp:  [18-20] 20 (08/10 0814) BP: (114-139)/(49-68) 114/49 (08/10 0814) SpO2:  [97 %-98 %] 98 % (08/10 0814) Weight:  [120 kg-123.3 kg] 120 kg (08/10 0700) Last BM Date: 06/20/18 General:   Alert, well-developed,  female in NAD Psych:  Pleasant, cooperative. Normal mood and affect. Eyes:  Pupils equal, sclera clear, no icterus.   Conjunctiva pink. Ears:  Normal auditory acuity. Nose:  No deformity, discharge,  or lesions. Neck:  Supple; no masses Lungs:  Clear throughout to auscultation.   No wheezes, crackles, or rhonchi.  Heart:  Regular rate and rhythm; no murmurs, no edema Abdomen:  Soft, non-distended, nontender, BS active, no palp mass    Rectal:  Deferred  Msk:  Symmetrical without gross deformities. . Pulses:  Normal pulses noted. Neurologic:  Alert and  oriented x4;  grossly normal neurologically. Skin:  Intact without significant lesions or rashes..   Intake/Output from previous day: 08/09 0701 - 08/10 0700 In: 838.5 [I.V.:838.5]  Out: 2100 [Urine:2100] Intake/Output this shift: Total I/O In: -  Out: 900 [Urine:900]  Lab  Results: Recent Labs    06/20/18 0341  WBC 6.2  HGB 8.4*  HCT 29.0*  PLT 257   BMET Recent Labs    06/20/18 0341  NA 142  K 3.7  CL 104  CO2 31  GLUCOSE 96  BUN 13  CREATININE 0.90  CALCIUM 8.7*   LFT Recent Labs    06/20/18 0341  PROT 5.6*  ALBUMIN 2.8*  AST 15  ALT 11  ALKPHOS 88  BILITOT 0.9   PT/INR No results for input(s): LABPROT, INR in the last 72 hours. Hepatitis Panel No results for input(s): HEPBSAG, HCVAB, HEPAIGM, HEPBIGM in the last 72 hours.    Studies/Results: No results found.   Carmell Austria MD @  06/20/2018, 9:27 AM

## 2018-06-20 NOTE — Progress Notes (Signed)
Patient's IV site is located in upper left arm/shoulder area.  Site has redness, but no soreness.  IV flushes well without any issues.  Redness is believed to be a result of skin irritation from tape.  Patient stated she has had reactions from certain adhesives in the past.  I removed the old Tegaderm dressing, cleaned the area around the IV insertion site, and applied a new Tegaderm dressing.  Patient stated her skin felt much better afterwards.

## 2018-06-20 NOTE — Progress Notes (Signed)
PROGRESS NOTE    Jennifer Medina   ZRA:[id number]  DOB: 11-06-1935  DOA: 06/19/2018 PCP: Nicoletta Dress, MD   Brief Narrative:  Jennifer Medina 83 y/o with DVT/PE, PAF, ICH who presents for rectal bleeding starting on the morning of 8/9. She had 2 episodes prior to presenting to the ED. She had bright red blood mixed with stool yesterday.   Subjective: She admits to 3 more bloody BMs since being in the hospital. She has no abdominal pain, nausea or vomiting. I asked her to sit up in the bed to examine her and she noticed dizziness/ felt light headed when sitting up.     Assessment & Plan:   Principal Problem:   Rectal bleeding - last dose of Xarelto was on Thursday evening - she had a colonoscopy last year when she had a similar episode of bleeding ans was told that she had diverticulosis - CTA in [place] showed no extravasation of dye  - cont conservative management - appreciate GI consult- d/c Protonix infusion - clear liquids started by GI  Active Problems: Anemia - Hgb in [place] yesterday 9.4- now 8.8 - has significant Iron deficiency per blood work- Ferretin is 7, Iron sat 6- will order Feraheme - B12 151- will start replacing it s/c    Paroxysmal atrial fibrillation  - cont low dose of Metoprolol with holding parameters    Morbid obesity Body mass index is 44.02 kg/m.    Tricuspid regurgitation   Pulmonary artery hypertension  - d/c Lasix in setting of acute bleeding and dropping BP  DM2 - hold Amaryl and place on SSI  UTI - UA + at [place]   DVT prophylaxis: SCDs Code Status: Full code Family Communication:  Disposition Plan: home when stable Consultants:   GI Procedures:   none Antimicrobials:  Anti-infectives (From admission, onward)   Start     Dose/Rate Route Frequency Ordered Stop   06/19/18 1900  levofloxacin (LEVAQUIN) tablet 250 mg     250 mg Oral Daily 06/19/18 1846         Objective: Vitals:   06/19/18  1700 06/19/18 2331 06/20/18 0700 06/20/18 0814  BP: 139/68 (!) 130/54  (!) 114/49  Pulse: 67 78  69  Resp: 18 18  20   Temp: 97.8 F (36.6 C) 98.4 F (36.9 C)  98.2 F (36.8 C)  TempSrc: Oral Oral  Oral  SpO2: 98% 97%  98%  Weight: 123.3 kg  120 kg   Height: 5\' 5"  (1.651 m)       Intake/Output Summary (Last 24 hours) at 06/20/2018 1440 Last data filed at 06/20/2018 1100 Gross per 24 hour  Intake 1000.8 ml  Output 3000 ml  Net -1999.2 ml   Filed Weights   06/19/18 1700 06/20/18 0700  Weight: 123.3 kg 120 kg    Examination: General exam: Appears comfortable  HEENT: PERRLA, oral mucosa moist, no sclera icterus or thrush Respiratory system: Clear to auscultation. Respiratory effort normal. Cardiovascular system: S1 & S2 heard, RRR.   Gastrointestinal system: Abdomen soft, non-tender, nondistended. Normal bowel sound. No organomegaly Central nervous system: Alert and oriented. No focal neurological deficits. Extremities: No cyanosis, clubbing or edema Skin: No rashes or ulcers Psychiatry:  Mood & affect appropriate.     Data Reviewed: I have personally reviewed following labs and imaging studies  CBC: Recent Labs  Lab 06/20/18 0341 06/20/18 0911  WBC 6.2 7.1  HGB 8.4* 8.8*  HCT 29.0* 30.3*  MCV 80.6 80.6  PLT 257 637   Basic Metabolic Panel: Recent Labs  Lab 06/20/18 0341 06/20/18 0911  NA 142 142  K 3.7 3.4*  CL 104 101  CO2 31 31  GLUCOSE 96 100*  BUN 13 13  CREATININE 0.90 0.99  CALCIUM 8.7* 8.9   GFR: Estimated Creatinine Clearance: 57.8 mL/min (by C-G formula based on SCr of 0.99 mg/dL). Liver Function Tests: Recent Labs  Lab 06/20/18 0341  AST 15  ALT 11  ALKPHOS 88  BILITOT 0.9  PROT 5.6*  ALBUMIN 2.8*   No results for input(s): LIPASE, AMYLASE in the last 168 hours. No results for input(s): AMMONIA in the last 168 hours. Coagulation Profile: No results for input(s): INR, PROTIME in the last 168 hours. Cardiac Enzymes: No results for  input(s): CKTOTAL, CKMB, CKMBINDEX, TROPONINI in the last 168 hours. BNP (last 3 results) No results for input(s): PROBNP in the last 8760 hours. HbA1C: No results for input(s): HGBA1C in the last 72 hours. CBG: Recent Labs  Lab 06/20/18 1232  GLUCAP 78   Lipid Profile: No results for input(s): CHOL, HDL, LDLCALC, TRIG, CHOLHDL, LDLDIRECT in the last 72 hours. Thyroid Function Tests: No results for input(s): TSH, T4TOTAL, FREET4, T3FREE, THYROIDAB in the last 72 hours. Anemia Panel: Recent Labs    06/20/18 0911  VITAMINB12 151*  FOLATE 22.1  FERRITIN 7*  TIBC 410  IRON 24*  RETICCTPCT 1.8   Urine analysis:    Component Value Date/Time   COLORURINE AMBER (A) 01/16/2017 0303   APPEARANCEUR HAZY (A) 01/16/2017 0303   LABSPEC >1.046 (H) 01/16/2017 0303   PHURINE 5.0 01/16/2017 0303   GLUCOSEU NEGATIVE 01/16/2017 0303   HGBUR LARGE (A) 01/16/2017 0303   BILIRUBINUR NEGATIVE 01/16/2017 0303   KETONESUR NEGATIVE 01/16/2017 0303   PROTEINUR 100 (A) 01/16/2017 0303   NITRITE NEGATIVE 01/16/2017 0303   LEUKOCYTESUR NEGATIVE 01/16/2017 0303   Sepsis Labs: @LABRCNTIP (procalcitonin:4,lacticidven:4) )No results found for this or any previous visit (from the past 240 hour(s)).       Radiology Studies: No results found.    Scheduled Meds: . sodium chloride   Intravenous Once  . insulin aspart  0-5 Units Subcutaneous QHS  . insulin aspart  0-9 Units Subcutaneous TID WC  . latanoprost  1 drop Both Eyes QHS  . levofloxacin  250 mg Oral Daily  . metoprolol succinate  12.5 mg Oral Daily   Continuous Infusions: . pantoprozole (PROTONIX) infusion 8 mg/hr (06/20/18 1100)     LOS: 1 day    Time spent in minutes: 35    Debbe Odea, MD [facility] Pager: [email or web address] Password [email or web address] 06/20/2018, 2:40 PM

## 2018-06-21 DIAGNOSIS — Z7901 Long term (current) use of anticoagulants: Secondary | ICD-10-CM

## 2018-06-21 DIAGNOSIS — D62 Acute posthemorrhagic anemia: Secondary | ICD-10-CM

## 2018-06-21 DIAGNOSIS — K5790 Diverticulosis of intestine, part unspecified, without perforation or abscess without bleeding: Secondary | ICD-10-CM

## 2018-06-21 DIAGNOSIS — I5032 Chronic diastolic (congestive) heart failure: Secondary | ICD-10-CM

## 2018-06-21 LAB — BASIC METABOLIC PANEL
Anion gap: 9 (ref 5–15)
BUN: 11 mg/dL (ref 8–23)
CO2: 29 mmol/L (ref 22–32)
CREATININE: 0.9 mg/dL (ref 0.44–1.00)
Calcium: 8.5 mg/dL — ABNORMAL LOW (ref 8.9–10.3)
Chloride: 101 mmol/L (ref 98–111)
GFR calc Af Amer: >60 mL/min (ref >60)
GFR calc non Af Amer: 58 mL/min — ABNORMAL LOW (ref >60)
GLUCOSE: 93 mg/dL (ref 70–99)
Potassium: 3.2 mmol/L — ABNORMAL LOW (ref 3.5–5.1)
SODIUM: 139 mmol/L (ref 135–145)

## 2018-06-21 LAB — CBC
HCT: 26.8 % — ABNORMAL LOW (ref 36.0–46.0)
HEMATOCRIT: 30.6 % — AB (ref 36.0–46.0)
HEMOGLOBIN: 7.7 g/dL — AB (ref 12.0–15.0)
Hemoglobin: 8.8 g/dL — ABNORMAL LOW (ref 12.0–15.0)
MCH: 23.1 pg — AB (ref 26.0–34.0)
MCH: 23.7 pg — ABNORMAL LOW (ref 26.0–34.0)
MCHC: 28.7 g/dL — AB (ref 30.0–36.0)
MCHC: 28.8 g/dL — ABNORMAL LOW (ref 30.0–36.0)
MCV: 80.2 fL (ref 78.0–100.0)
MCV: 82.3 fL (ref 78.0–100.0)
PLATELETS: 246 10*3/uL (ref 150–400)
Platelets: 240 10*3/uL (ref 150–400)
RBC: 3.34 MIL/uL — ABNORMAL LOW (ref 3.87–5.11)
RBC: 3.72 MIL/uL — AB (ref 3.87–5.11)
RDW: 16.5 % — ABNORMAL HIGH (ref 11.5–15.5)
RDW: 16.5 % — AB (ref 11.5–15.5)
WBC: 6.2 10*3/uL (ref 4.0–10.5)
WBC: 6.2 10*3/uL (ref 4.0–10.5)

## 2018-06-21 LAB — PREPARE RBC (CROSSMATCH)

## 2018-06-21 LAB — GLUCOSE, CAPILLARY
GLUCOSE-CAPILLARY: 147 mg/dL — AB (ref 70–99)
GLUCOSE-CAPILLARY: 159 mg/dL — AB (ref 70–99)
Glucose-Capillary: 85 mg/dL (ref 70–99)
Glucose-Capillary: 130 mg/dL — ABNORMAL HIGH (ref 70–99)

## 2018-06-21 MED ORDER — SODIUM CHLORIDE 0.9% IV SOLUTION
Freq: Once | INTRAVENOUS | Status: AC
  Administered 2018-06-21: 10:00:00 via INTRAVENOUS

## 2018-06-21 MED ORDER — SODIUM CHLORIDE 0.9 % IV BOLUS
1000.0000 mL | Freq: Once | INTRAVENOUS | Status: AC
  Administered 2018-06-21: 1000 mL via INTRAVENOUS

## 2018-06-21 MED ORDER — SODIUM CHLORIDE 0.9 % IV SOLN
510.0000 mg | Freq: Once | INTRAVENOUS | Status: AC
  Administered 2018-06-21: 510 mg via INTRAVENOUS
  Filled 2018-06-21: qty 17

## 2018-06-21 MED ORDER — CYANOCOBALAMIN 1000 MCG/ML IJ SOLN
1000.0000 ug | Freq: Every day | INTRAMUSCULAR | Status: DC
  Administered 2018-06-21 – 2018-06-22 (×2): 1000 ug via SUBCUTANEOUS
  Filled 2018-06-21 (×2): qty 1

## 2018-06-21 MED ORDER — VITAMIN B-12 1000 MCG PO TABS: 1000.0000 ug | ORAL_TABLET | Freq: Every day | ORAL | 0 refills | Status: AC

## 2018-06-21 MED ORDER — POTASSIUM CHLORIDE CRYS ER 20 MEQ PO TBCR
40.0000 meq | EXTENDED_RELEASE_TABLET | ORAL | Status: AC
  Administered 2018-06-21 (×2): 40 meq via ORAL
  Filled 2018-06-21 (×2): qty 2

## 2018-06-21 MED ORDER — FERROUS SULFATE 325 (65 FE) MG PO TBEC: 325.0000 mg | DELAYED_RELEASE_TABLET | Freq: Two times a day (BID) | ORAL | 3 refills | Status: AC

## 2018-06-21 NOTE — Progress Notes (Signed)
Patient was to receive 1  Unit of blood.  Prior to infusion, this RN flushed site and site appeared to leak initially. Added order for IV team consult per scope of practice to evaluate need and placement of new IV.  As IV team arrived, flushed a second time and IV did not leak.  IV team assessed the line at that time and found it to flush, no pain, no signs or symptoms of malfunction.  IV infusion of blood began at 0959, patient was tolerating infusion well, however at 10:52 patient called and stated IV painful.  Iv stopped immediately, IV team called for consult.  Heat applied to site.  IV team attended to patient and placed a new Peripheral IV. Blood continued to transfuse without further complications and completed on time.

## 2018-06-21 NOTE — Plan of Care (Signed)
Discussed plan of care with patient.  Emphasized pain management and using the call button when assistance is needed.  Patient provided good teach back.

## 2018-06-21 NOTE — Progress Notes (Addendum)
     [facility] Progress Note   Chief Complaint:  Rectal bleeding   SUBJECTIVE:    feels fine, stool back to normal, no bleeding   ASSESSMENT AND PLAN:   81. 83 yo female with DVT / PE, PAF admitted with recurrent painless hematochezia on Xarelto. Suspect diverticular hemorrhage. Negative [facility] at [facility]. She had similar scenario in May of last year and colonoscopy at the time was apparently unrevealing. Bleeding has resolved. Normal colored stool now.   -Xarelto on hold.  -await post transfusion H+H.  -please hold Xarelto for a week -GI will s/o, call for questions.   2. Acute on chronic anemia.  Baseline hgb 11-12, down to 8.4 on admit yesterday and now 7.7. She is borderline microcytic and labs c/w IDA. Ferritin is 7. Also with B12 deficiency. -she is getting a unit of blood now -To get IV iron infusion   Attending physician's note   I have taken an interval history, reviewed the chart and examined the patient. I agree with the Advanced Practitioner's note, impression and recommendations. Seen this AM  Likely diverticular bleed- resolved. Resume Xeralto in 1 week. FU with Dr Melina Copa in [place]. Will sign off. Pl call with any questions.  Carmell Austria, MD  OBJECTIVE:     Vital signs in last 24 hours: Temp:  [98.1 F (36.7 C)-98.6 F (37 C)] 98.1 F (36.7 C) (08/11 1203) Pulse Rate:  [61-75] 63 (08/11 1203) Resp:  [15-18] 18 (08/11 1203) BP: (118-134)/(46-60) 134/60 (08/11 1203) SpO2:  [96 %-98 %] 96 % (08/11 1014) Last BM Date: 06/21/18 General:   Alert female in NAD EENT:  Normal hearing, non icteric sclera, conjunctive pink.  Heart:  Regular rate and rhythm; no murmurs. Trace BLE edema Pulm: Normal respiratory effort Abdomen:  Soft, nondistended, nontender.  Normal bowel sounds    Neurologic:  Alert and  oriented x4;  grossly normal neurologically. Psych:  Pleasant, cooperative.  Normal mood and affect.   Intake/Output from previous day: 08/10  0701 - 08/11 0700 In: 162.3 [I.V.:162.3] Out: 900 [Urine:900] Intake/Output this shift: Total I/O In: 444 [P.O.:444] Out: -   Lab Results: Recent Labs    06/20/18 0911 06/20/18 1751 06/21/18 0257  WBC 7.1 6.8 6.2  HGB 8.8* 8.3* 7.7*  HCT 30.3* 29.1* 26.8*  PLT 257 253 240   BMET Recent Labs    06/20/18 0341 06/20/18 0911 06/21/18 0257  NA 142 142 139  K 3.7 3.4* 3.2*  CL 104 101 101  CO2 31 31 29   GLUCOSE 96 100* 93  BUN 13 13 11   CREATININE 0.90 0.99 0.90  CALCIUM 8.7* 8.9 8.5*   LFT Recent Labs    06/20/18 0341  PROT 5.6*  ALBUMIN 2.8*  AST 15  ALT 11  ALKPHOS 88  BILITOT 0.9    Principal Problem:   Rectal bleeding Active Problems:   Paroxysmal atrial fibrillation (HCC)   Morbid obesity (HCC)   Tricuspid regurgitation   Pulmonary artery hypertension ([place])     LOS: 2 days   Tye Savoy ,NP 06/21/2018, 1:28 PM

## 2018-06-21 NOTE — Discharge Summary (Signed)
Physician Discharge Summary  Jennifer Medina:[id number] DOB: August 11, 1935 DOA: 06/19/2018  PCP: Nicoletta Dress, MD  Admit date: 06/19/2018 Discharge date: 06/21/2018  Admitted From: home Disposition:  home   Recommendations for Outpatient Follow-up:  1. PCP to f/u on Hgb, Iron and B12 levels    Discharge Condition:  stable   CODE STATUS:  Full code   Consultations:  GI    Discharge Diagnoses:  Principal Problem:   Rectal bleeding Active Problems:   Anemia associated with acute blood loss   Diverticulosis   Chronic diastolic heart failure (HCC)   Paroxysmal atrial fibrillation (HCC)   Chronic anticoagulation   Morbid obesity (HCC)   Tricuspid regurgitation   Pulmonary artery hypertension ([place])      Brief Summary: Jennifer Medina 83 y/o with DVT/PE, PAF, ICH who presents for rectal bleeding starting on the morning of 8/9. She had 2 episodes prior to presenting to the ED. She had bright red blood mixed with stool yesterday.  Hospital Course:  Principal Problem:   Rectal bleeding- possibly diverticular - last dose of Xarelto was on Thursday evening - she had a colonoscopy last year when she had a similar episode of bleeding and was told that she had diverticulosis - CTA in [place] showed no extravasation of dye  - cont conservative management- appreciate GI consult-   - bleeding has stopped and as of yesterday she is having brown stool without blood - she has been recommended to hold Xarelto for 1 wk   Active Problems: Anemia due to acute blood loss and Iron deficiency  - Hgb in [place] 9.4  - dropped to 8.8 yesterday and to 7.7 today- 1 U PRBC has been transfused bringing Hgb up to 8.8 - has significant Iron deficiency per blood work- Ferretin is 7, Iron sat 6- - I have given her a dose of Feraheme - B12 151- we have given a dose of s/c B12  - will d/c home with Oral Iron and B12 - PCP to follow up levels and CBC as outpt    Paroxysmal atrial  fibrillation  - cont  Metoprolol     Morbid obesity Body mass index is 44.02 kg/m.    Tricuspid regurgitation   Pulmonary artery hypertension  H//o d CHF -   Lasix was held in setting of acute bleeding and marginal BP - she can resume it tomorrow  DM2 -   Amaryl    UTI - UA + at [facility]- she usually has frequent micturation form Lasix and could not tell if she had any other symptoms suggestive of a UTI- she has received 3 days of Levaquin- she is not having any dysuria, suprapubic tenderness or frequency or other symptoms suggestive of a UTI   Discharge Exam: Vitals:   06/21/18 1203 06/21/18 1332  BP: 134/60 (!) 121/52  Pulse: 63 68  Resp: 18 18  Temp: 98.1 F (36.7 C) 98.2 F (36.8 C)  SpO2:     Vitals:   06/21/18 0935 06/21/18 1014 06/21/18 1203 06/21/18 1332  BP: (!) 126/50 (!) 123/53 134/60 (!) 121/52  Pulse: 68 61 63 68  Resp: 18  18 18   Temp: 98.3 F (36.8 C) 98.3 F (36.8 C) 98.1 F (36.7 C) 98.2 F (36.8 C)  TempSrc: Oral Oral Oral Oral  SpO2: 98% 96%    Weight:      Height:        General: Pt is alert, awake, not in acute distress Cardiovascular: RRR, S1/S2 +,  no rubs, no gallops Respiratory: CTA bilaterally, no wheezing, no rhonchi Abdominal: Soft, NT, ND, bowel sounds + Extremities: no edema, no cyanosis   Discharge Instructions  Discharge Instructions    Diet - low sodium heart healthy   Complete by:  As directed    Discharge instructions   Complete by:  As directed    You may resume Xarelto in 1 wk. Please continue to monitor for blood in the stool. Stop [name] and come to the ED if you see blood.   Increase activity slowly   Complete by:  As directed      Allergies as of 06/21/2018      Reactions   Aspirin Other (See Comments)   GI bleed   Nsaids Other (See Comments)   GI bleed   Penicillins Shortness Of Breath, Rash   Has patient had a PCN reaction causing immediate rash, facial/tongue/throat swelling, SOB or  lightheadedness with hypotension: No Has patient had a PCN reaction causing severe rash involving mucus membranes or skin necrosis: No Has patient had a PCN reaction that required hospitalization: No Has patient had a PCN reaction occurring within the last 10 years: No If all of the above answers are "NO", then may proceed with Cephalosporin use.   Simvastatin Nausea Only   Vancomycin Rash      Medication List    STOP taking these medications   rivaroxaban 20 MG Tabs tablet Commonly known as:  XARELTO     TAKE these medications   acetaminophen 500 MG tablet Commonly known as:  TYLENOL Take 500-1,000 mg by mouth every 6 (six) hours as needed for moderate pain or headache.   atorvastatin 40 MG tablet Commonly known as:  LIPITOR Take 40 mg by mouth at bedtime.   ferrous sulfate 325 (65 FE) MG EC tablet Take 1 tablet (325 mg total) by mouth 2 (two) times daily with a meal.   furosemide 40 MG tablet Commonly known as:  LASIX Take 2 tablets (80 mg total) by mouth daily.   glimepiride 2 MG tablet Commonly known as:  AMARYL Take 2 mg by mouth every morning.   guaiFENesin 600 MG 12 hr tablet Commonly known as:  MUCINEX Take 600 mg by mouth 2 (two) times daily as needed.   JOINTFLEX EX Apply 1 application topically as needed (for pain).   latanoprost 0.005 % ophthalmic solution Commonly known as:  XALATAN Place 1 drop into both eyes at bedtime.   loperamide 2 MG tablet Commonly known as:  IMODIUM A-D Take 2 mg by mouth 4 (four) times daily as needed for diarrhea or loose stools.   LORazepam 0.5 MG tablet Commonly known as:  ATIVAN Take 1 tablet (0.5 mg total) by mouth every 8 (eight) hours as needed for anxiety.   metoprolol succinate 25 MG 24 hr tablet Commonly known as:  TOPROL-XL Take 25 mg by mouth daily.   nitroGLYCERIN 0.4 MG SL tablet Commonly known as:  NITROSTAT Place 0.4 mg under the tongue every 5 (five) minutes as needed for chest pain.   Omeprazole 20  MG Tbec Take 20 mg by mouth daily.   potassium chloride SA 20 MEQ tablet Commonly known as:  K-DUR,KLOR-CON Take 20 mEq by mouth 2 (two) times daily.   sodium chloride 0.65 % Soln nasal spray Commonly known as:  OCEAN Place 1 spray into both nostrils as needed for congestion. Please use 10 times a day   traMADol 50 MG tablet Commonly known as:  ULTRAM Take  1 tablet (50 mg total) by mouth every 6 (six) hours as needed for moderate pain.   vitamin B-12 1000 MCG tablet Commonly known as:  CYANOCOBALAMIN Take 1 tablet (1,000 mcg total) by mouth daily.      Follow-up Information    Nicoletta Dress, MD. Schedule an appointment as soon as possible for a visit in 1 week(s).   Specialty:  Internal Medicine Why:  f/u next Monday for post hospital eval- BP, HR, Bmet and CBC needs to be assessed  Contact information: [place] [address] [phone]          Allergies  Allergen Reactions  . Aspirin Other (See Comments)    GI bleed  . Nsaids Other (See Comments)    GI bleed  . Penicillins Shortness Of Breath and Rash    Has patient had a PCN reaction causing immediate rash, facial/tongue/throat swelling, SOB or lightheadedness with hypotension: No Has patient had a PCN reaction causing severe rash involving mucus membranes or skin necrosis: No Has patient had a PCN reaction that required hospitalization: No Has patient had a PCN reaction occurring within the last 10 years: No If all of the above answers are "NO", then may proceed with Cephalosporin use.  . Simvastatin Nausea Only  . Vancomycin Rash     Procedures/Studies:    No results found.   The results of significant diagnostics from this hospitalization (including imaging, microbiology, ancillary and laboratory) are listed below for reference.     Microbiology: No results found for this or any previous visit (from the past 240 hour(s)).   Labs: BNP (last 3 results) No  results for input(s): BNP in the last 8760 hours. Basic Metabolic Panel: Recent Labs  Lab 06/20/18 0341 06/20/18 0911 06/21/18 0257  NA 142 142 139  K 3.7 3.4* 3.2*  CL 104 101 101  CO2 31 31 29   GLUCOSE 96 100* 93  BUN 13 13 11   CREATININE 0.90 0.99 0.90  CALCIUM 8.7* 8.9 8.5*   Liver Function Tests: Recent Labs  Lab 06/20/18 0341  AST 15  ALT 11  ALKPHOS 88  BILITOT 0.9  PROT 5.6*  ALBUMIN 2.8*   No results for input(s): LIPASE, AMYLASE in the last 168 hours. No results for input(s): AMMONIA in the last 168 hours. CBC: Recent Labs  Lab 06/20/18 0341 06/20/18 0911 06/20/18 1751 06/21/18 0257 06/21/18 1500  WBC 6.2 7.1 6.8 6.2 6.2  HGB 8.4* 8.8* 8.3* 7.7* 8.8*  HCT 29.0* 30.3* 29.1* 26.8* 30.6*  MCV 80.6 80.6 80.6 80.2 82.3  PLT 257 257 253 240 246   Cardiac Enzymes: No results for input(s): CKTOTAL, CKMB, CKMBINDEX, TROPONINI in the last 168 hours. BNP: Invalid input(s): POCBNP CBG: Recent Labs  Lab 06/20/18 1232 06/20/18 1646 06/20/18 2113 06/21/18 0718 06/21/18 1219  GLUCAP 78 122* 108* 85 147*   D-Dimer No results for input(s): DDIMER in the last 72 hours. Hgb A1c No results for input(s): HGBA1C in the last 72 hours. Lipid Profile No results for input(s): CHOL, HDL, LDLCALC, TRIG, CHOLHDL, LDLDIRECT in the last 72 hours. Thyroid function studies No results for input(s): TSH, T4TOTAL, T3FREE, THYROIDAB in the last 72 hours.  Invalid input(s): FREET3 Anemia work up Recent Labs    06/20/18 0911  VITAMINB12 151*  FOLATE 22.1  FERRITIN 7*  TIBC 410  IRON 24*  RETICCTPCT 1.8   Urinalysis    Component Value Date/Time   COLORURINE AMBER (A) 01/16/2017 0303  APPEARANCEUR HAZY (A) 01/16/2017 0303   LABSPEC >1.046 (H) 01/16/2017 0303   PHURINE 5.0 01/16/2017 0303   GLUCOSEU NEGATIVE 01/16/2017 0303   HGBUR LARGE (A) 01/16/2017 0303   BILIRUBINUR NEGATIVE 01/16/2017 0303   KETONESUR NEGATIVE 01/16/2017 0303   PROTEINUR 100 (A)  01/16/2017 0303   NITRITE NEGATIVE 01/16/2017 0303   LEUKOCYTESUR NEGATIVE 01/16/2017 0303   Sepsis Labs Invalid input(s): PROCALCITONIN,  WBC,  LACTICIDVEN Microbiology No results found for this or any previous visit (from the past 240 hour(s)).   Time coordinating discharge in minutes: 60  SIGNED:   Debbe Odea, MD  [facility] 06/21/2018, 4:16 PM Pager   If 7PM-7AM, please contact night-coverage [email or web address] Password [email or web address]

## 2018-06-22 LAB — GLUCOSE, CAPILLARY
Glucose-Capillary: 133 mg/dL — ABNORMAL HIGH (ref 70–99)
Glucose-Capillary: 103 mg/dL — ABNORMAL HIGH (ref 70–99)

## 2018-06-22 LAB — BASIC METABOLIC PANEL
Anion gap: 7 (ref 5–15)
BUN: 11 mg/dL (ref 8–23)
CALCIUM: 8.8 mg/dL — AB (ref 8.9–10.3)
CHLORIDE: 106 mmol/L (ref 98–111)
CO2: 28 mmol/L (ref 22–32)
CREATININE: 0.92 mg/dL (ref 0.44–1.00)
GFR calc Af Amer: >60 mL/min (ref >60)
GFR calc non Af Amer: 57 mL/min — ABNORMAL LOW (ref >60)
GLUCOSE: 110 mg/dL — AB (ref 70–99)
Potassium: 3.9 mmol/L (ref 3.5–5.1)
Sodium: 141 mmol/L (ref 135–145)

## 2018-06-22 LAB — BPAM RBC
Blood Product Expiration Date: 201909012359
ISSUE DATE / TIME: 201908110935
Unit Type and Rh: 7300

## 2018-06-22 LAB — TYPE AND SCREEN
ABO/RH(D): B POS
ANTIBODY SCREEN: NEGATIVE
Unit division: 0

## 2018-06-22 MED ORDER — INSULIN ASPART 100 UNIT/ML ~~LOC~~ SOLN
0.0000 [IU] | Freq: Three times a day (TID) | SUBCUTANEOUS | Status: DC
Start: 2018-06-22 — End: 2018-06-22
  Administered 2018-06-22: 1 [IU] via SUBCUTANEOUS

## 2018-06-22 NOTE — Plan of Care (Signed)
Discussed plan of care with patient.  Emphasized using the call button when assistance is needed.  Good teach back displayed.

## 2018-06-22 NOTE — Care Management Important Message (Signed)
Important Message  Patient Details  Name: Jennifer Medina MRN: [id number] Date of Birth: Dec 11, 1934   Medicare Important Message Given:  Yes    Grenda Lora Montine Circle 06/22/2018, 4:01 PM

## 2018-06-22 NOTE — Care Management Note (Signed)
Case Management Note  Patient Details  Name: Jennifer Medina MRN: [id number] Date of Birth: 19-Jan-1935  Subjective/Objective: From home, presents with GIB for dc today, will need HHRN, HHPT, HHAIDE,  NCM offered choice from [employer], patient states she has had [facility] before and would like to have them, referral made to [facility] with [place], soc will begin 24-48 hrs post dc. She states she as a rolling walker at home , a w/chair, bsc and grab bars, no DME needed.                   Action/Plan: DC home with [facility] services when ready.  Expected Discharge Date:  06/22/18               Expected Discharge Plan:  [place]  In-House Referral:     Discharge planning Services  CM Consult  Post Acute Care Choice:  Home Health Choice offered to:  Patient  DME Arranged:    DME Agency:     HH Arranged:  RN, PT, Nurse's Aide [place] Agency:  [place]  Status of Service:  Completed, signed off  If discussed at [place] of Stay Meetings, dates discussed:    Additional Comments:  [name], RN 06/22/2018, 12:34 PM

## 2018-06-22 NOTE — Discharge Summary (Addendum)
[facility]  The patient was not discharged yesterday as she was a little light headed when ambulating. She was orthostatic by vital signs as well and I gave her 1 L NS- Lasix has already been held. She is not fluid overloaded today. Lungs are are clear on exam. Abdomen remains non-tender. Pulse ox 98% on room air. She is now able to ambulate down the hall but is still a bit weak. No DOE. Still no further GI bleeding. I have ordered HHPT for her today.  She is stable for d/c.   Debbe Odea, MD

## 2018-06-23 NOTE — Consult Note (Signed)
            [facility] CM Primary Care Navigator  06/23/2018  Mayda [facility] 03-31-1935 [id number]   Went to seepatient at the bedside to identify possible discharge needs butshe wasalreadydischargeper staff. Per chart review, patient was dischargedhome with home health services.  Per MD note,patientwas seen related to bright red blood mixed with stool, (rectal bleeding, anemia associated with acute blood loss) GI consulted.  Patienthas discharge instruction to follow-up withprimary care provider in 1 week.  Primary care provider's office is listed as providing transition of care.   For additional questions please contact:  Edwena Felty A. Analucia Hush, BSN, RN-BC [facility] PRIMARY CARE Navigator Cell: [phone]

## 2018-06-30 DIAGNOSIS — D519 Vitamin B12 deficiency anemia, unspecified: Secondary | ICD-10-CM | POA: Diagnosis not present

## 2018-06-30 DIAGNOSIS — K625 Hemorrhage of anus and rectum: Secondary | ICD-10-CM | POA: Diagnosis not present

## 2018-06-30 DIAGNOSIS — D509 Iron deficiency anemia, unspecified: Secondary | ICD-10-CM | POA: Diagnosis not present

## 2018-06-30 DIAGNOSIS — D62 Acute posthemorrhagic anemia: Secondary | ICD-10-CM | POA: Diagnosis not present

## 2018-07-09 DIAGNOSIS — D509 Iron deficiency anemia, unspecified: Secondary | ICD-10-CM | POA: Diagnosis not present

## 2018-07-09 DIAGNOSIS — E785 Hyperlipidemia, unspecified: Secondary | ICD-10-CM | POA: Diagnosis not present

## 2018-07-09 DIAGNOSIS — I10 Essential (primary) hypertension: Secondary | ICD-10-CM | POA: Diagnosis not present

## 2018-07-09 DIAGNOSIS — Z1339 Encounter for screening examination for other mental health and behavioral disorders: Secondary | ICD-10-CM | POA: Diagnosis not present

## 2018-07-09 DIAGNOSIS — M1711 Unilateral primary osteoarthritis, right knee: Secondary | ICD-10-CM | POA: Diagnosis not present

## 2018-07-09 DIAGNOSIS — I5032 Chronic diastolic (congestive) heart failure: Secondary | ICD-10-CM | POA: Diagnosis not present

## 2018-07-09 DIAGNOSIS — D519 Vitamin B12 deficiency anemia, unspecified: Secondary | ICD-10-CM | POA: Diagnosis not present

## 2018-07-09 DIAGNOSIS — E1165 Type 2 diabetes mellitus with hyperglycemia: Secondary | ICD-10-CM | POA: Diagnosis not present

## 2018-07-09 DIAGNOSIS — I480 Paroxysmal atrial fibrillation: Secondary | ICD-10-CM | POA: Diagnosis not present

## 2018-07-10 DIAGNOSIS — E1165 Type 2 diabetes mellitus with hyperglycemia: Secondary | ICD-10-CM | POA: Diagnosis not present

## 2018-08-26 DIAGNOSIS — E119 Type 2 diabetes mellitus without complications: Secondary | ICD-10-CM | POA: Diagnosis not present

## 2018-09-14 DIAGNOSIS — L03116 Cellulitis of left lower limb: Secondary | ICD-10-CM | POA: Diagnosis not present

## 2018-10-27 IMAGING — US IR INFUSION THROMBOL ARTERIAL INITIAL (MS)
1 series · 1 of 1 positions shown · non-contrast
Comparison: Chest CTA - 01/15/2017

INDICATION: Acute bilateral pulmonary emboli. Recent new cardiac arrhythmia and
progressive right heart strain.

EXAM:
1. ULTRASOUND GUIDANCE FOR VENOUS ACCESS X2
2. DIRECT PULMONARY ARTERY PRESSURE MEASUREMENTS
3. FLUOROSCOPIC GUIDED PLACEMENT OF BILATERAL PULMONARY ARTERIAL
LYTIC INFUSION CATHETERS
TECHNIQUE: Informed written consent was obtained from the patient after a
discussion of the risks, benefits and alternatives to treatment.
Questions regarding the procedure were encouraged and answered. A
timeout was performed prior to the initiation of the procedure.

[Series 1: ir rad eval and mgt. · 1 of 1 slices shown]
[im 1/1]
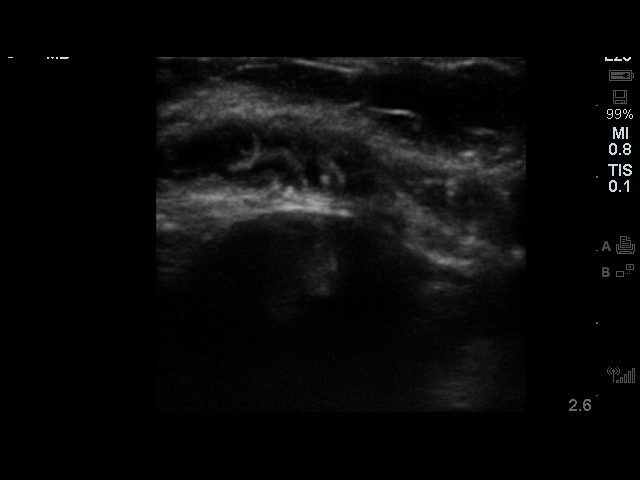

[1 of 1 positions shown; findings below may reference images not displayed]

MEDICATIONS:
Intravenous Fentanyl and Versed were administered as conscious
sedation during continuous monitoring of the patient's level of
consciousness and physiological / cardiorespiratory status by the
radiology RN, with a total moderate sedation time of 00minutes.

CONTRAST:  None used

FLUOROSCOPY TIME:  6.9 min  (82  uVymW DAP)

COMPLICATIONS:
None immediate
Previous ultrasound had demonstrated right lower extremity DVT.
Ultrasound scanning was performed of the right IJ vein demonstrating
widely patent. As such, the right internal jugular vein was selected
for vascular access.

The right neck was prepped and draped in the usual sterile fashion,
and a sterile drape was applied covering the operative field.
Maximum barrier sterile technique with sterile gowns and gloves were
used for the procedure. A timeout was performed prior to the
initiation of the procedure. Local anesthesia was provided with 1%
lidocaine.

Under direct ultrasound guidance, the right internal jugular vein
was accessed with a micro puncture sheath ultimately allowing
placement of a 6 French vascular sheath. With the use of a
glidewire, an angled pigtail catheter was advanced into the right
main pulmonary artery and Pressure measurements were then obtained
from the right pulmonary artery. Over a wire, the pigtail catheter
was exchanged for a 105/18 cm multi side-hole EKOS ultrasound
assisted infusion catheter.

Slightly cranial to this initial access, the right internal jugular
vein was again accessed with a micropuncture sheath ultimately
allowing placement of a 6 French vascular sheath. With the use of a
glidewire, a pigtail catheter was advanced into the left main
pulmonary artery . Over an exchange length wire, the pigtail
catheter was exchanged for a 105/12 cm multi side-hole EKOS
ultrasound assisted infusion catheter.

A postprocedural fluoroscopic image was obtained of the check
demonstrating final catheter positioning.

Both vascular sheath were secured at the right neck with interrupted
0 Prolene suture. The external catheter tubing was secured at the
right chest and the lytic therapy was initiated.

The patient tolerated the procedure well without immediate
postprocedural complication.
FINDINGS: Directly recorded proximal right pulmonary artery pressure
measurements:

46/10 (23) mmHg (normal: < [DATE])

Following the procedure, both ultrasound assisted infusion catheter
tips terminate within the distal aspects of the bilateral lower lobe
sub segmental pulmonary arteries.
IMPRESSION: 1. Successful fluoroscopic guided initiation of bilateral ultrasound
assisted catheter directed pulmonary arterial lysis for sub massive
pulmonary embolism and right-sided heart strain.
2. Markedly elevated pressure measurements within the right main
pulmonary artery compatible with critical pulmonary arterial
hypertension.

PLAN:
- The patient will be assessed approximately 12 hours following the
initiation of the catheter directed pulmonary arterial lysis for
repeat pressure measurements and either removal of the catheters or
continuation of the catheter directed thrombolysis.

## 2018-10-28 IMAGING — CR DG CHEST 1V PORT
1 series · 1 of 1 positions shown · non-contrast
Comparison: 01/15/2017 portable chest and earlier.

CLINICAL DATA: 82-year-old female with acute saddle embolus.
Postoperative day 1 status post catheter directed
ultrasound-assisted bilateral pulmonary arterial thrombolysis
initiated via R IJ x2. Hemoptysis

EXAM:
PORTABLE CHEST 1 VIEW

[AP]
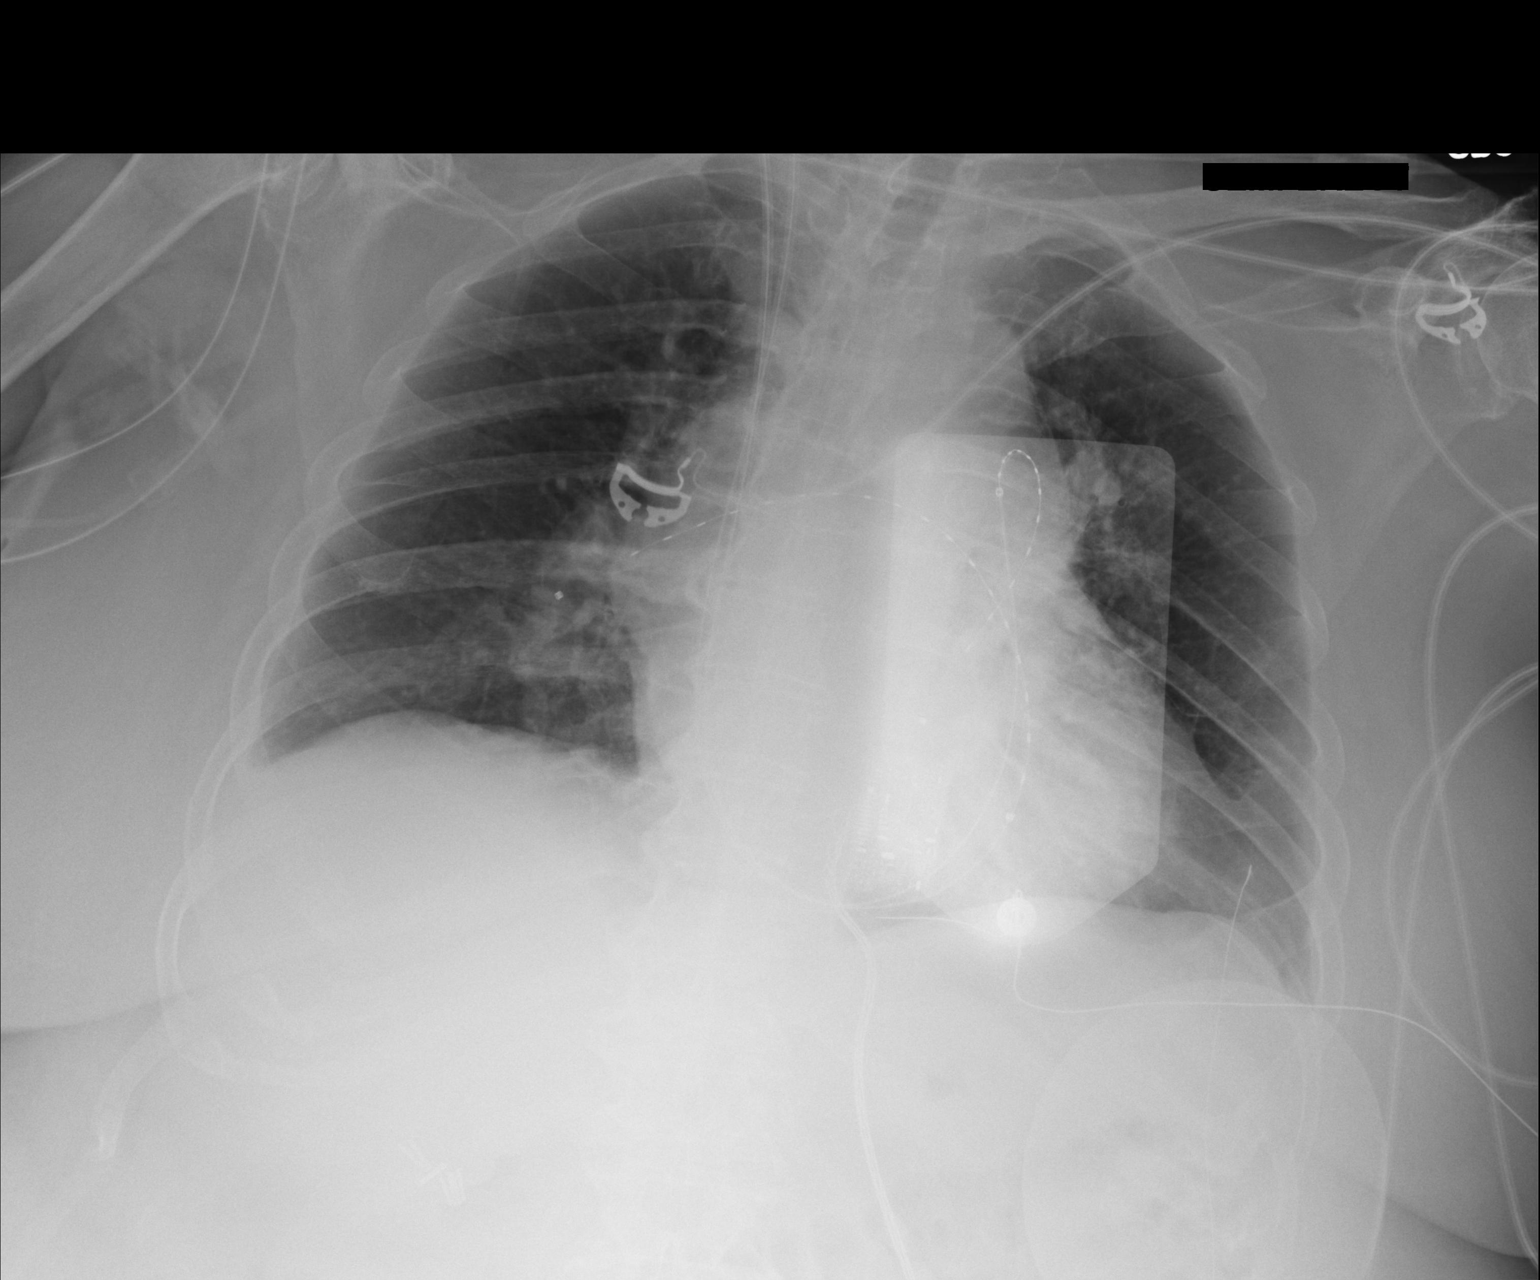

[1 of 1 positions shown; findings below may reference images not displayed]

FINDINGS: Portable AP semi upright view at 7216 hours. Right IJ approach
pulmonary artery infusion catheters are in place. Left chest pacer
or resuscitation pads are in place. Stable lung volumes. Stable
cardiac size and mediastinal contours. Allowing for portable
technique the lungs are clear.
IMPRESSION: Right IJ approach pulmonary artery infusion catheters in place,
otherwise stable and negative portable radiographic appearance of
the chest.

## 2018-10-29 IMAGING — CR DG CHEST 1V PORT
1 series · 1 of 1 positions shown · non-contrast
Comparison: 01/19/2017.

CLINICAL DATA: Respiratory failure.

EXAM:
PORTABLE CHEST 1 VIEW

[AP]
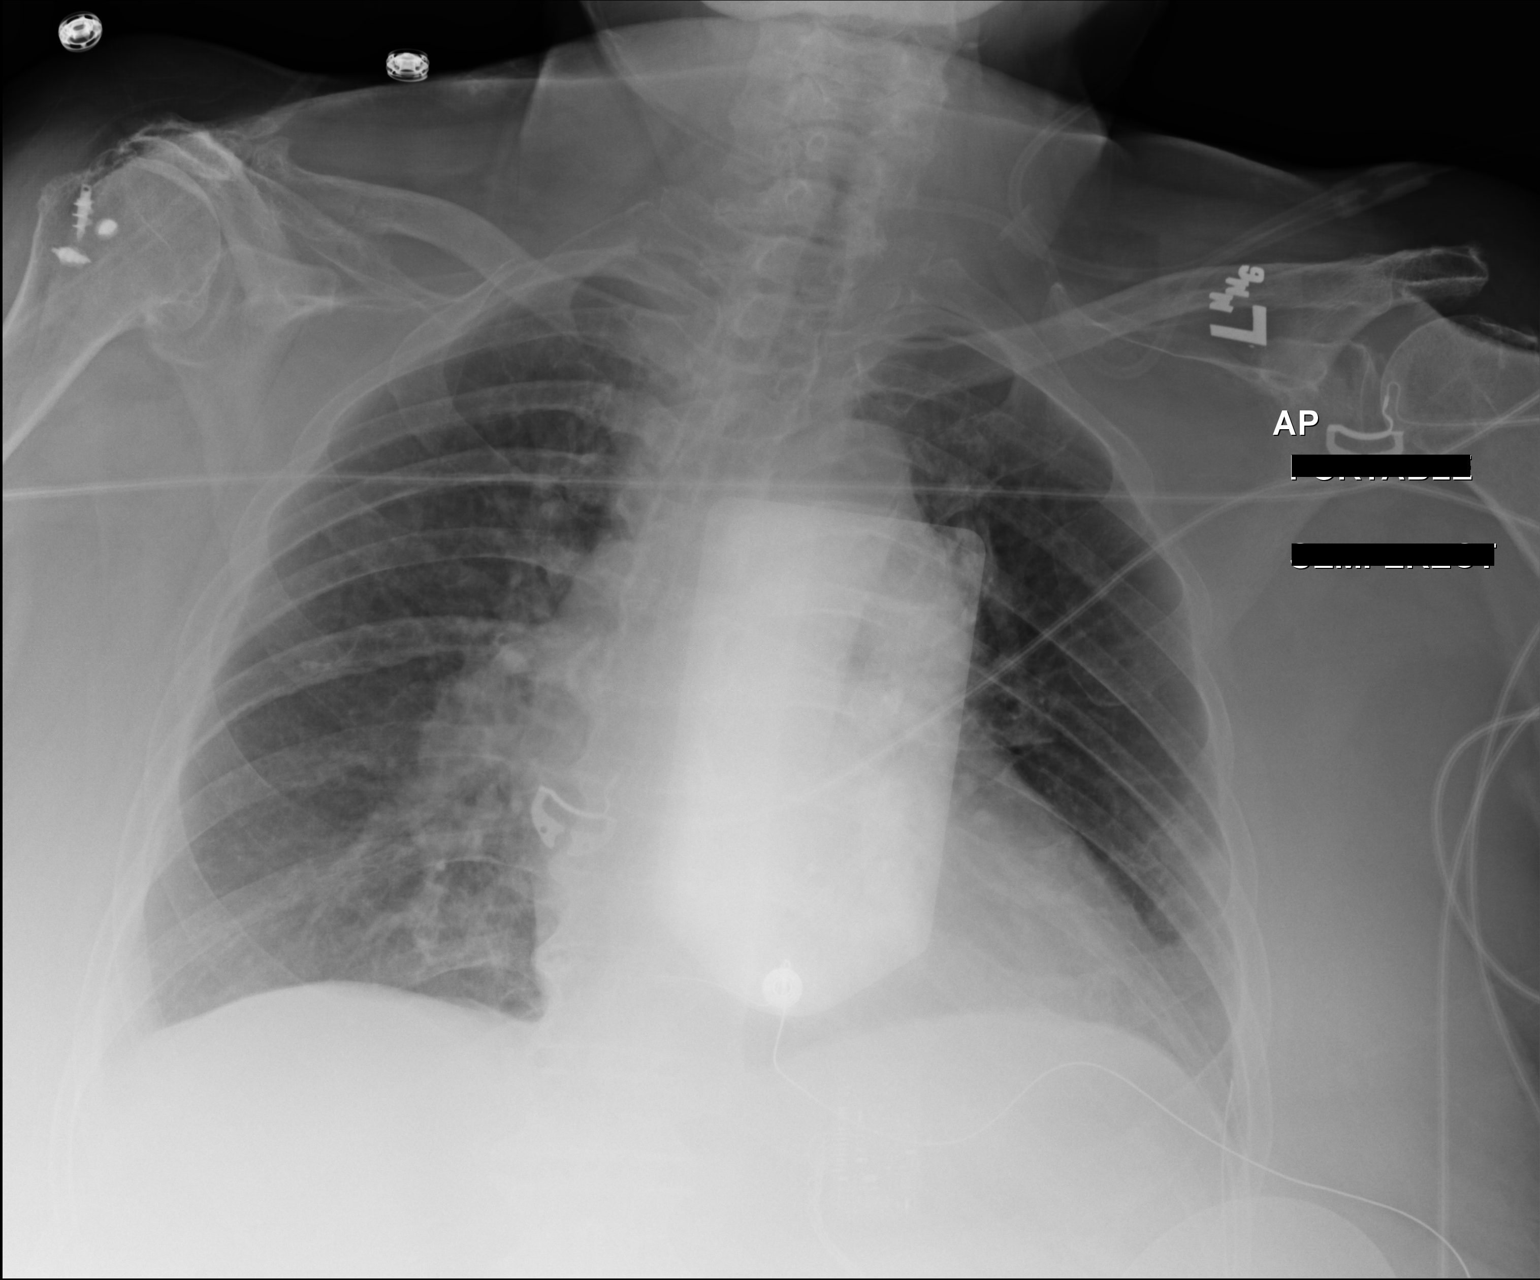

[1 of 1 positions shown; findings below may reference images not displayed]

FINDINGS: Interim removal of pulmonary infusion catheters. Stable
cardiomegaly. Low lung volumes with basilar subsegmental
atelectasis. Small right pleural effusion cannot be excluded. No
pneumothorax. Postsurgical changes right shoulder.
IMPRESSION: 1. Interim removal of pulmonary infusion catheters.

2. Stable cardiomegaly.

3. Low lung volumes with mild basilar atelectasis . Small right
pleural effusion cannot be excluded.

## 2019-01-13 DIAGNOSIS — I5032 Chronic diastolic (congestive) heart failure: Secondary | ICD-10-CM | POA: Diagnosis not present

## 2019-01-13 DIAGNOSIS — Z23 Encounter for immunization: Secondary | ICD-10-CM | POA: Diagnosis not present

## 2019-01-13 DIAGNOSIS — E1165 Type 2 diabetes mellitus with hyperglycemia: Secondary | ICD-10-CM | POA: Diagnosis not present

## 2019-01-13 DIAGNOSIS — Z9181 History of falling: Secondary | ICD-10-CM | POA: Diagnosis not present

## 2019-01-13 DIAGNOSIS — D519 Vitamin B12 deficiency anemia, unspecified: Secondary | ICD-10-CM | POA: Diagnosis not present

## 2019-01-13 DIAGNOSIS — I480 Paroxysmal atrial fibrillation: Secondary | ICD-10-CM | POA: Diagnosis not present

## 2019-01-13 DIAGNOSIS — D509 Iron deficiency anemia, unspecified: Secondary | ICD-10-CM | POA: Diagnosis not present

## 2019-01-13 DIAGNOSIS — Z1331 Encounter for screening for depression: Secondary | ICD-10-CM | POA: Diagnosis not present

## 2019-01-13 DIAGNOSIS — E785 Hyperlipidemia, unspecified: Secondary | ICD-10-CM | POA: Diagnosis not present

## 2019-01-13 DIAGNOSIS — I10 Essential (primary) hypertension: Secondary | ICD-10-CM | POA: Diagnosis not present

## 2019-02-16 DIAGNOSIS — E785 Hyperlipidemia, unspecified: Secondary | ICD-10-CM | POA: Diagnosis not present

## 2019-02-16 DIAGNOSIS — Z6841 Body Mass Index (BMI) 40.0 and over, adult: Secondary | ICD-10-CM | POA: Diagnosis not present

## 2019-02-16 DIAGNOSIS — N959 Unspecified menopausal and perimenopausal disorder: Secondary | ICD-10-CM | POA: Diagnosis not present

## 2019-02-16 DIAGNOSIS — Z136 Encounter for screening for cardiovascular disorders: Secondary | ICD-10-CM | POA: Diagnosis not present

## 2019-02-16 DIAGNOSIS — Z1339 Encounter for screening examination for other mental health and behavioral disorders: Secondary | ICD-10-CM | POA: Diagnosis not present

## 2019-02-16 DIAGNOSIS — Z9181 History of falling: Secondary | ICD-10-CM | POA: Diagnosis not present

## 2019-02-16 DIAGNOSIS — Z0000 Encounter for general adult medical examination without abnormal findings: Secondary | ICD-10-CM | POA: Diagnosis not present

## 2019-02-16 DIAGNOSIS — Z1231 Encounter for screening mammogram for malignant neoplasm of breast: Secondary | ICD-10-CM | POA: Diagnosis not present

## 2019-02-16 DIAGNOSIS — Z1331 Encounter for screening for depression: Secondary | ICD-10-CM | POA: Diagnosis not present

## 2019-05-18 DIAGNOSIS — L03116 Cellulitis of left lower limb: Secondary | ICD-10-CM | POA: Diagnosis not present

## 2019-05-18 DIAGNOSIS — R600 Localized edema: Secondary | ICD-10-CM | POA: Diagnosis not present

## 2019-05-26 DIAGNOSIS — H40013 Open angle with borderline findings, low risk, bilateral: Secondary | ICD-10-CM | POA: Diagnosis not present

## 2019-06-04 DIAGNOSIS — Z1231 Encounter for screening mammogram for malignant neoplasm of breast: Secondary | ICD-10-CM | POA: Diagnosis not present

## 2019-06-04 DIAGNOSIS — N959 Unspecified menopausal and perimenopausal disorder: Secondary | ICD-10-CM | POA: Diagnosis not present

## 2019-06-04 DIAGNOSIS — M810 Age-related osteoporosis without current pathological fracture: Secondary | ICD-10-CM | POA: Diagnosis not present

## 2019-06-04 DIAGNOSIS — M8589 Other specified disorders of bone density and structure, multiple sites: Secondary | ICD-10-CM | POA: Diagnosis not present

## 2019-07-20 DIAGNOSIS — D509 Iron deficiency anemia, unspecified: Secondary | ICD-10-CM | POA: Diagnosis not present

## 2019-07-20 DIAGNOSIS — E785 Hyperlipidemia, unspecified: Secondary | ICD-10-CM | POA: Diagnosis not present

## 2019-07-20 DIAGNOSIS — D519 Vitamin B12 deficiency anemia, unspecified: Secondary | ICD-10-CM | POA: Diagnosis not present

## 2019-07-20 DIAGNOSIS — I480 Paroxysmal atrial fibrillation: Secondary | ICD-10-CM | POA: Diagnosis not present

## 2019-07-20 DIAGNOSIS — I5032 Chronic diastolic (congestive) heart failure: Secondary | ICD-10-CM | POA: Diagnosis not present

## 2019-07-20 DIAGNOSIS — I10 Essential (primary) hypertension: Secondary | ICD-10-CM | POA: Diagnosis not present

## 2019-07-20 DIAGNOSIS — Z139 Encounter for screening, unspecified: Secondary | ICD-10-CM | POA: Diagnosis not present

## 2019-07-20 DIAGNOSIS — E1169 Type 2 diabetes mellitus with other specified complication: Secondary | ICD-10-CM | POA: Diagnosis not present

## 2020-01-17 DIAGNOSIS — I10 Essential (primary) hypertension: Secondary | ICD-10-CM | POA: Diagnosis not present

## 2020-01-17 DIAGNOSIS — E785 Hyperlipidemia, unspecified: Secondary | ICD-10-CM | POA: Diagnosis not present

## 2020-01-17 DIAGNOSIS — Z23 Encounter for immunization: Secondary | ICD-10-CM | POA: Diagnosis not present

## 2020-01-17 DIAGNOSIS — D509 Iron deficiency anemia, unspecified: Secondary | ICD-10-CM | POA: Diagnosis not present

## 2020-01-17 DIAGNOSIS — E1169 Type 2 diabetes mellitus with other specified complication: Secondary | ICD-10-CM | POA: Diagnosis not present

## 2020-01-17 DIAGNOSIS — I5032 Chronic diastolic (congestive) heart failure: Secondary | ICD-10-CM | POA: Diagnosis not present

## 2020-01-17 DIAGNOSIS — I480 Paroxysmal atrial fibrillation: Secondary | ICD-10-CM | POA: Diagnosis not present

## 2020-01-17 DIAGNOSIS — D519 Vitamin B12 deficiency anemia, unspecified: Secondary | ICD-10-CM | POA: Diagnosis not present

## 2020-02-07 DIAGNOSIS — D62 Acute posthemorrhagic anemia: Secondary | ICD-10-CM | POA: Diagnosis not present

## 2020-02-07 DIAGNOSIS — Z8719 Personal history of other diseases of the digestive system: Secondary | ICD-10-CM | POA: Diagnosis not present

## 2020-02-07 DIAGNOSIS — Z7401 Bed confinement status: Secondary | ICD-10-CM | POA: Diagnosis not present

## 2020-02-07 DIAGNOSIS — Z6841 Body Mass Index (BMI) 40.0 and over, adult: Secondary | ICD-10-CM | POA: Diagnosis not present

## 2020-02-07 DIAGNOSIS — R935 Abnormal findings on diagnostic imaging of other abdominal regions, including retroperitoneum: Secondary | ICD-10-CM | POA: Diagnosis not present

## 2020-02-07 DIAGNOSIS — K922 Gastrointestinal hemorrhage, unspecified: Secondary | ICD-10-CM | POA: Diagnosis not present

## 2020-02-07 DIAGNOSIS — K5790 Diverticulosis of intestine, part unspecified, without perforation or abscess without bleeding: Secondary | ICD-10-CM | POA: Diagnosis not present

## 2020-02-07 DIAGNOSIS — C20 Malignant neoplasm of rectum: Secondary | ICD-10-CM | POA: Diagnosis not present

## 2020-02-07 DIAGNOSIS — Z7984 Long term (current) use of oral hypoglycemic drugs: Secondary | ICD-10-CM | POA: Diagnosis not present

## 2020-02-07 DIAGNOSIS — Z993 Dependence on wheelchair: Secondary | ICD-10-CM | POA: Diagnosis not present

## 2020-02-07 DIAGNOSIS — I2782 Chronic pulmonary embolism: Secondary | ICD-10-CM | POA: Diagnosis not present

## 2020-02-07 DIAGNOSIS — I5032 Chronic diastolic (congestive) heart failure: Secondary | ICD-10-CM | POA: Diagnosis not present

## 2020-02-07 DIAGNOSIS — Z7901 Long term (current) use of anticoagulants: Secondary | ICD-10-CM | POA: Diagnosis not present

## 2020-02-07 DIAGNOSIS — R5381 Other malaise: Secondary | ICD-10-CM | POA: Diagnosis not present

## 2020-02-07 DIAGNOSIS — D649 Anemia, unspecified: Secondary | ICD-10-CM | POA: Diagnosis not present

## 2020-02-08 DIAGNOSIS — K5790 Diverticulosis of intestine, part unspecified, without perforation or abscess without bleeding: Secondary | ICD-10-CM | POA: Diagnosis not present

## 2020-02-08 DIAGNOSIS — D62 Acute posthemorrhagic anemia: Secondary | ICD-10-CM | POA: Diagnosis not present

## 2020-02-08 DIAGNOSIS — R935 Abnormal findings on diagnostic imaging of other abdominal regions, including retroperitoneum: Secondary | ICD-10-CM | POA: Diagnosis not present

## 2020-02-08 DIAGNOSIS — R5381 Other malaise: Secondary | ICD-10-CM | POA: Diagnosis not present

## 2020-02-08 DIAGNOSIS — Z7984 Long term (current) use of oral hypoglycemic drugs: Secondary | ICD-10-CM | POA: Diagnosis not present

## 2020-02-08 DIAGNOSIS — C20 Malignant neoplasm of rectum: Secondary | ICD-10-CM | POA: Diagnosis not present

## 2020-02-08 DIAGNOSIS — I2782 Chronic pulmonary embolism: Secondary | ICD-10-CM | POA: Diagnosis not present

## 2020-02-08 DIAGNOSIS — Z7401 Bed confinement status: Secondary | ICD-10-CM | POA: Diagnosis not present

## 2020-02-08 DIAGNOSIS — Z8719 Personal history of other diseases of the digestive system: Secondary | ICD-10-CM | POA: Diagnosis not present

## 2020-02-08 DIAGNOSIS — D649 Anemia, unspecified: Secondary | ICD-10-CM | POA: Diagnosis not present

## 2020-02-08 DIAGNOSIS — K922 Gastrointestinal hemorrhage, unspecified: Secondary | ICD-10-CM | POA: Diagnosis not present

## 2020-02-08 DIAGNOSIS — Z6841 Body Mass Index (BMI) 40.0 and over, adult: Secondary | ICD-10-CM | POA: Diagnosis not present

## 2020-02-08 DIAGNOSIS — Z7901 Long term (current) use of anticoagulants: Secondary | ICD-10-CM | POA: Diagnosis not present

## 2020-02-08 DIAGNOSIS — I5032 Chronic diastolic (congestive) heart failure: Secondary | ICD-10-CM | POA: Diagnosis not present

## 2020-02-08 DIAGNOSIS — Z993 Dependence on wheelchair: Secondary | ICD-10-CM | POA: Diagnosis not present

## 2020-02-09 DIAGNOSIS — Z7401 Bed confinement status: Secondary | ICD-10-CM | POA: Diagnosis not present

## 2020-02-09 DIAGNOSIS — Z6841 Body Mass Index (BMI) 40.0 and over, adult: Secondary | ICD-10-CM | POA: Diagnosis not present

## 2020-02-09 DIAGNOSIS — I2782 Chronic pulmonary embolism: Secondary | ICD-10-CM | POA: Diagnosis not present

## 2020-02-09 DIAGNOSIS — Z7984 Long term (current) use of oral hypoglycemic drugs: Secondary | ICD-10-CM | POA: Diagnosis not present

## 2020-02-09 DIAGNOSIS — Z8719 Personal history of other diseases of the digestive system: Secondary | ICD-10-CM | POA: Diagnosis not present

## 2020-02-09 DIAGNOSIS — I5032 Chronic diastolic (congestive) heart failure: Secondary | ICD-10-CM | POA: Diagnosis not present

## 2020-02-09 DIAGNOSIS — K922 Gastrointestinal hemorrhage, unspecified: Secondary | ICD-10-CM | POA: Diagnosis not present

## 2020-02-09 DIAGNOSIS — K5790 Diverticulosis of intestine, part unspecified, without perforation or abscess without bleeding: Secondary | ICD-10-CM | POA: Diagnosis not present

## 2020-02-09 DIAGNOSIS — R5381 Other malaise: Secondary | ICD-10-CM | POA: Diagnosis not present

## 2020-02-09 DIAGNOSIS — R935 Abnormal findings on diagnostic imaging of other abdominal regions, including retroperitoneum: Secondary | ICD-10-CM | POA: Diagnosis not present

## 2020-02-09 DIAGNOSIS — D649 Anemia, unspecified: Secondary | ICD-10-CM | POA: Diagnosis not present

## 2020-02-09 DIAGNOSIS — D62 Acute posthemorrhagic anemia: Secondary | ICD-10-CM | POA: Diagnosis not present

## 2020-02-09 DIAGNOSIS — Z993 Dependence on wheelchair: Secondary | ICD-10-CM | POA: Diagnosis not present

## 2020-02-09 DIAGNOSIS — C20 Malignant neoplasm of rectum: Secondary | ICD-10-CM | POA: Diagnosis not present

## 2020-02-09 DIAGNOSIS — Z7901 Long term (current) use of anticoagulants: Secondary | ICD-10-CM | POA: Diagnosis not present

## 2020-02-10 DIAGNOSIS — Z7901 Long term (current) use of anticoagulants: Secondary | ICD-10-CM | POA: Diagnosis not present

## 2020-02-10 DIAGNOSIS — C20 Malignant neoplasm of rectum: Secondary | ICD-10-CM | POA: Diagnosis not present

## 2020-02-10 DIAGNOSIS — D62 Acute posthemorrhagic anemia: Secondary | ICD-10-CM | POA: Diagnosis not present

## 2020-02-10 DIAGNOSIS — R935 Abnormal findings on diagnostic imaging of other abdominal regions, including retroperitoneum: Secondary | ICD-10-CM | POA: Diagnosis not present

## 2020-02-10 DIAGNOSIS — Z8719 Personal history of other diseases of the digestive system: Secondary | ICD-10-CM | POA: Diagnosis not present

## 2020-02-10 DIAGNOSIS — Z7401 Bed confinement status: Secondary | ICD-10-CM | POA: Diagnosis not present

## 2020-02-10 DIAGNOSIS — K5790 Diverticulosis of intestine, part unspecified, without perforation or abscess without bleeding: Secondary | ICD-10-CM | POA: Diagnosis not present

## 2020-02-10 DIAGNOSIS — Z993 Dependence on wheelchair: Secondary | ICD-10-CM | POA: Diagnosis not present

## 2020-02-10 DIAGNOSIS — D649 Anemia, unspecified: Secondary | ICD-10-CM | POA: Diagnosis not present

## 2020-02-10 DIAGNOSIS — K922 Gastrointestinal hemorrhage, unspecified: Secondary | ICD-10-CM | POA: Diagnosis not present

## 2020-02-10 DIAGNOSIS — Z6841 Body Mass Index (BMI) 40.0 and over, adult: Secondary | ICD-10-CM | POA: Diagnosis not present

## 2020-02-10 DIAGNOSIS — R5381 Other malaise: Secondary | ICD-10-CM | POA: Diagnosis not present

## 2020-02-10 DIAGNOSIS — I2782 Chronic pulmonary embolism: Secondary | ICD-10-CM | POA: Diagnosis not present

## 2020-02-10 DIAGNOSIS — I5032 Chronic diastolic (congestive) heart failure: Secondary | ICD-10-CM | POA: Diagnosis not present

## 2020-02-10 DIAGNOSIS — Z7984 Long term (current) use of oral hypoglycemic drugs: Secondary | ICD-10-CM | POA: Diagnosis not present

## 2020-02-11 DIAGNOSIS — R5381 Other malaise: Secondary | ICD-10-CM | POA: Diagnosis not present

## 2020-02-11 DIAGNOSIS — C20 Malignant neoplasm of rectum: Secondary | ICD-10-CM | POA: Diagnosis not present

## 2020-02-11 DIAGNOSIS — Z7901 Long term (current) use of anticoagulants: Secondary | ICD-10-CM | POA: Diagnosis not present

## 2020-02-11 DIAGNOSIS — R935 Abnormal findings on diagnostic imaging of other abdominal regions, including retroperitoneum: Secondary | ICD-10-CM | POA: Diagnosis not present

## 2020-02-11 DIAGNOSIS — I5032 Chronic diastolic (congestive) heart failure: Secondary | ICD-10-CM | POA: Diagnosis not present

## 2020-02-11 DIAGNOSIS — Z8719 Personal history of other diseases of the digestive system: Secondary | ICD-10-CM | POA: Diagnosis not present

## 2020-02-11 DIAGNOSIS — I2782 Chronic pulmonary embolism: Secondary | ICD-10-CM | POA: Diagnosis not present

## 2020-02-11 DIAGNOSIS — Z993 Dependence on wheelchair: Secondary | ICD-10-CM | POA: Diagnosis not present

## 2020-02-11 DIAGNOSIS — K922 Gastrointestinal hemorrhage, unspecified: Secondary | ICD-10-CM | POA: Diagnosis not present

## 2020-02-11 DIAGNOSIS — D62 Acute posthemorrhagic anemia: Secondary | ICD-10-CM | POA: Diagnosis not present

## 2020-02-11 DIAGNOSIS — K5790 Diverticulosis of intestine, part unspecified, without perforation or abscess without bleeding: Secondary | ICD-10-CM | POA: Diagnosis not present

## 2020-02-11 DIAGNOSIS — D649 Anemia, unspecified: Secondary | ICD-10-CM | POA: Diagnosis not present

## 2020-02-11 DIAGNOSIS — Z7984 Long term (current) use of oral hypoglycemic drugs: Secondary | ICD-10-CM | POA: Diagnosis not present

## 2020-02-11 DIAGNOSIS — Z7401 Bed confinement status: Secondary | ICD-10-CM | POA: Diagnosis not present

## 2020-02-11 DIAGNOSIS — Z6841 Body Mass Index (BMI) 40.0 and over, adult: Secondary | ICD-10-CM | POA: Diagnosis not present

## 2020-02-12 DIAGNOSIS — Z7401 Bed confinement status: Secondary | ICD-10-CM | POA: Diagnosis not present

## 2020-02-12 DIAGNOSIS — I2782 Chronic pulmonary embolism: Secondary | ICD-10-CM | POA: Diagnosis not present

## 2020-02-12 DIAGNOSIS — C20 Malignant neoplasm of rectum: Secondary | ICD-10-CM | POA: Diagnosis not present

## 2020-02-12 DIAGNOSIS — Z993 Dependence on wheelchair: Secondary | ICD-10-CM | POA: Diagnosis not present

## 2020-02-12 DIAGNOSIS — D62 Acute posthemorrhagic anemia: Secondary | ICD-10-CM | POA: Diagnosis not present

## 2020-02-12 DIAGNOSIS — D649 Anemia, unspecified: Secondary | ICD-10-CM | POA: Diagnosis not present

## 2020-02-12 DIAGNOSIS — I5032 Chronic diastolic (congestive) heart failure: Secondary | ICD-10-CM | POA: Diagnosis not present

## 2020-02-12 DIAGNOSIS — Z6841 Body Mass Index (BMI) 40.0 and over, adult: Secondary | ICD-10-CM | POA: Diagnosis not present

## 2020-02-12 DIAGNOSIS — R5381 Other malaise: Secondary | ICD-10-CM | POA: Diagnosis not present

## 2020-02-12 DIAGNOSIS — Z7984 Long term (current) use of oral hypoglycemic drugs: Secondary | ICD-10-CM | POA: Diagnosis not present

## 2020-02-13 DIAGNOSIS — I2782 Chronic pulmonary embolism: Secondary | ICD-10-CM | POA: Diagnosis not present

## 2020-02-13 DIAGNOSIS — C20 Malignant neoplasm of rectum: Secondary | ICD-10-CM | POA: Diagnosis not present

## 2020-02-13 DIAGNOSIS — Z7984 Long term (current) use of oral hypoglycemic drugs: Secondary | ICD-10-CM | POA: Diagnosis not present

## 2020-02-13 DIAGNOSIS — I5032 Chronic diastolic (congestive) heart failure: Secondary | ICD-10-CM | POA: Diagnosis not present

## 2020-02-13 DIAGNOSIS — R5381 Other malaise: Secondary | ICD-10-CM | POA: Diagnosis not present

## 2020-02-13 DIAGNOSIS — Z993 Dependence on wheelchair: Secondary | ICD-10-CM | POA: Diagnosis not present

## 2020-02-13 DIAGNOSIS — Z7401 Bed confinement status: Secondary | ICD-10-CM | POA: Diagnosis not present

## 2020-02-13 DIAGNOSIS — Z6841 Body Mass Index (BMI) 40.0 and over, adult: Secondary | ICD-10-CM | POA: Diagnosis not present

## 2020-02-13 DIAGNOSIS — D62 Acute posthemorrhagic anemia: Secondary | ICD-10-CM | POA: Diagnosis not present

## 2020-02-13 DIAGNOSIS — D649 Anemia, unspecified: Secondary | ICD-10-CM | POA: Diagnosis not present

## 2020-02-14 DIAGNOSIS — R5381 Other malaise: Secondary | ICD-10-CM | POA: Diagnosis not present

## 2020-02-14 DIAGNOSIS — Z6841 Body Mass Index (BMI) 40.0 and over, adult: Secondary | ICD-10-CM | POA: Diagnosis not present

## 2020-02-14 DIAGNOSIS — Z7401 Bed confinement status: Secondary | ICD-10-CM | POA: Diagnosis not present

## 2020-02-14 DIAGNOSIS — D649 Anemia, unspecified: Secondary | ICD-10-CM | POA: Diagnosis not present

## 2020-02-14 DIAGNOSIS — C20 Malignant neoplasm of rectum: Secondary | ICD-10-CM | POA: Diagnosis not present

## 2020-02-14 DIAGNOSIS — Z7984 Long term (current) use of oral hypoglycemic drugs: Secondary | ICD-10-CM | POA: Diagnosis not present

## 2020-02-14 DIAGNOSIS — I5032 Chronic diastolic (congestive) heart failure: Secondary | ICD-10-CM | POA: Diagnosis not present

## 2020-02-14 DIAGNOSIS — Z993 Dependence on wheelchair: Secondary | ICD-10-CM | POA: Diagnosis not present

## 2020-02-14 DIAGNOSIS — D62 Acute posthemorrhagic anemia: Secondary | ICD-10-CM | POA: Diagnosis not present

## 2020-02-14 DIAGNOSIS — I2782 Chronic pulmonary embolism: Secondary | ICD-10-CM | POA: Diagnosis not present

## 2020-02-15 DIAGNOSIS — I5032 Chronic diastolic (congestive) heart failure: Secondary | ICD-10-CM | POA: Diagnosis not present

## 2020-02-15 DIAGNOSIS — D649 Anemia, unspecified: Secondary | ICD-10-CM | POA: Diagnosis not present

## 2020-02-15 DIAGNOSIS — Z7401 Bed confinement status: Secondary | ICD-10-CM | POA: Diagnosis not present

## 2020-02-15 DIAGNOSIS — Z6841 Body Mass Index (BMI) 40.0 and over, adult: Secondary | ICD-10-CM | POA: Diagnosis not present

## 2020-02-15 DIAGNOSIS — I2782 Chronic pulmonary embolism: Secondary | ICD-10-CM | POA: Diagnosis not present

## 2020-02-15 DIAGNOSIS — C20 Malignant neoplasm of rectum: Secondary | ICD-10-CM | POA: Diagnosis not present

## 2020-02-15 DIAGNOSIS — Z7984 Long term (current) use of oral hypoglycemic drugs: Secondary | ICD-10-CM | POA: Diagnosis not present

## 2020-02-15 DIAGNOSIS — R600 Localized edema: Secondary | ICD-10-CM | POA: Diagnosis not present

## 2020-02-15 DIAGNOSIS — D62 Acute posthemorrhagic anemia: Secondary | ICD-10-CM | POA: Diagnosis not present

## 2020-02-15 DIAGNOSIS — Z993 Dependence on wheelchair: Secondary | ICD-10-CM | POA: Diagnosis not present

## 2020-02-15 DIAGNOSIS — R5381 Other malaise: Secondary | ICD-10-CM | POA: Diagnosis not present

## 2020-02-16 DIAGNOSIS — Z7984 Long term (current) use of oral hypoglycemic drugs: Secondary | ICD-10-CM | POA: Diagnosis not present

## 2020-02-16 DIAGNOSIS — D649 Anemia, unspecified: Secondary | ICD-10-CM | POA: Diagnosis not present

## 2020-02-16 DIAGNOSIS — C20 Malignant neoplasm of rectum: Secondary | ICD-10-CM | POA: Diagnosis not present

## 2020-02-16 DIAGNOSIS — Z6841 Body Mass Index (BMI) 40.0 and over, adult: Secondary | ICD-10-CM | POA: Diagnosis not present

## 2020-02-16 DIAGNOSIS — I2782 Chronic pulmonary embolism: Secondary | ICD-10-CM | POA: Diagnosis not present

## 2020-02-16 DIAGNOSIS — I5032 Chronic diastolic (congestive) heart failure: Secondary | ICD-10-CM | POA: Diagnosis not present

## 2020-02-16 DIAGNOSIS — Z993 Dependence on wheelchair: Secondary | ICD-10-CM | POA: Diagnosis not present

## 2020-02-16 DIAGNOSIS — R5381 Other malaise: Secondary | ICD-10-CM | POA: Diagnosis not present

## 2020-02-16 DIAGNOSIS — D62 Acute posthemorrhagic anemia: Secondary | ICD-10-CM | POA: Diagnosis not present

## 2020-02-16 DIAGNOSIS — Z7401 Bed confinement status: Secondary | ICD-10-CM | POA: Diagnosis not present

## 2020-02-17 DIAGNOSIS — C20 Malignant neoplasm of rectum: Secondary | ICD-10-CM | POA: Diagnosis not present

## 2020-02-17 DIAGNOSIS — Z7984 Long term (current) use of oral hypoglycemic drugs: Secondary | ICD-10-CM | POA: Diagnosis not present

## 2020-02-17 DIAGNOSIS — I5032 Chronic diastolic (congestive) heart failure: Secondary | ICD-10-CM | POA: Diagnosis not present

## 2020-02-17 DIAGNOSIS — D649 Anemia, unspecified: Secondary | ICD-10-CM | POA: Diagnosis not present

## 2020-02-17 DIAGNOSIS — I959 Hypotension, unspecified: Secondary | ICD-10-CM | POA: Diagnosis not present

## 2020-02-17 DIAGNOSIS — I2782 Chronic pulmonary embolism: Secondary | ICD-10-CM | POA: Diagnosis not present

## 2020-02-17 DIAGNOSIS — Z6841 Body Mass Index (BMI) 40.0 and over, adult: Secondary | ICD-10-CM | POA: Diagnosis not present

## 2020-02-17 DIAGNOSIS — Z743 Need for continuous supervision: Secondary | ICD-10-CM | POA: Diagnosis not present

## 2020-02-17 DIAGNOSIS — R5381 Other malaise: Secondary | ICD-10-CM | POA: Diagnosis not present

## 2020-02-17 DIAGNOSIS — R279 Unspecified lack of coordination: Secondary | ICD-10-CM | POA: Diagnosis not present

## 2020-02-17 DIAGNOSIS — Z7401 Bed confinement status: Secondary | ICD-10-CM | POA: Diagnosis not present

## 2020-02-17 DIAGNOSIS — Z993 Dependence on wheelchair: Secondary | ICD-10-CM | POA: Diagnosis not present

## 2020-02-17 DIAGNOSIS — D62 Acute posthemorrhagic anemia: Secondary | ICD-10-CM | POA: Diagnosis not present

## 2020-07-12 DEATH — deceased
